# Patient Record
Sex: Male | Born: 1950
Health system: Southern US, Community
[De-identification: ages and names within clinical notes are randomized; demographics above are authoritative.]

## PROBLEM LIST (undated history)

## (undated) DIAGNOSIS — I4510 Unspecified right bundle-branch block: Secondary | ICD-10-CM

## (undated) DIAGNOSIS — I700 Atherosclerosis of aorta: Secondary | ICD-10-CM

## (undated) DIAGNOSIS — C801 Malignant (primary) neoplasm, unspecified: Secondary | ICD-10-CM

## (undated) DIAGNOSIS — I499 Cardiac arrhythmia, unspecified: Secondary | ICD-10-CM

## (undated) DIAGNOSIS — I509 Heart failure, unspecified: Secondary | ICD-10-CM

## (undated) DIAGNOSIS — E785 Hyperlipidemia, unspecified: Secondary | ICD-10-CM

## (undated) DIAGNOSIS — C61 Malignant neoplasm of prostate: Secondary | ICD-10-CM

## (undated) DIAGNOSIS — I10 Essential (primary) hypertension: Secondary | ICD-10-CM

## (undated) DIAGNOSIS — H3530 Unspecified macular degeneration: Secondary | ICD-10-CM

## (undated) DIAGNOSIS — M1990 Unspecified osteoarthritis, unspecified site: Secondary | ICD-10-CM

## (undated) DIAGNOSIS — I4821 Permanent atrial fibrillation: Secondary | ICD-10-CM

## (undated) DIAGNOSIS — N529 Male erectile dysfunction, unspecified: Secondary | ICD-10-CM

## (undated) HISTORY — DX: Malignant (primary) neoplasm, unspecified: C80.1

## (undated) HISTORY — PX: APPENDECTOMY: SHX54

## (undated) HISTORY — PX: HERNIA REPAIR: SHX51

## (undated) HISTORY — DX: Hyperlipidemia, unspecified: E78.5

## (undated) HISTORY — PX: PROSTATE BIOPSY: SHX241

## (undated) HISTORY — DX: Essential (primary) hypertension: I10

## (undated) HISTORY — DX: Unspecified macular degeneration: H35.30

## (undated) HISTORY — DX: Permanent atrial fibrillation: I48.21

## (undated) HISTORY — DX: Malignant neoplasm of prostate: C61

## (undated) HISTORY — PX: TOTAL HIP ARTHROPLASTY: SHX124

## (undated) HISTORY — PX: SPINE SURGERY: SHX786

---

## 1978-09-01 HISTORY — PX: OTHER SURGICAL HISTORY: SHX169

## 2004-10-31 ENCOUNTER — Ambulatory Visit: Payer: Self-pay | Admitting: Internal Medicine

## 2004-11-13 ENCOUNTER — Ambulatory Visit: Payer: Self-pay

## 2004-12-04 ENCOUNTER — Ambulatory Visit: Payer: Self-pay | Admitting: *Deleted

## 2005-09-01 HISTORY — PX: CARDIAC CATHETERIZATION: SHX172

## 2005-09-18 ENCOUNTER — Emergency Department: Payer: Self-pay | Admitting: Internal Medicine

## 2005-09-18 ENCOUNTER — Ambulatory Visit: Payer: Self-pay | Admitting: *Deleted

## 2005-09-18 ENCOUNTER — Observation Stay (HOSPITAL_COMMUNITY): Admission: EM | Admit: 2005-09-18 | Discharge: 2005-09-19 | Payer: Self-pay | Admitting: Emergency Medicine

## 2005-09-19 ENCOUNTER — Encounter: Payer: Self-pay | Admitting: Cardiology

## 2005-09-19 DIAGNOSIS — I2510 Atherosclerotic heart disease of native coronary artery without angina pectoris: Secondary | ICD-10-CM

## 2005-09-19 HISTORY — DX: Atherosclerotic heart disease of native coronary artery without angina pectoris: I25.10

## 2005-09-19 HISTORY — PX: LEFT HEART CATH AND CORONARY ANGIOGRAPHY: CATH118249

## 2005-10-16 ENCOUNTER — Ambulatory Visit: Payer: Self-pay | Admitting: Cardiology

## 2006-11-06 ENCOUNTER — Ambulatory Visit: Payer: Self-pay | Admitting: Internal Medicine

## 2007-11-30 ENCOUNTER — Ambulatory Visit: Payer: Self-pay | Admitting: Internal Medicine

## 2008-11-17 ENCOUNTER — Ambulatory Visit: Payer: Self-pay | Admitting: Internal Medicine

## 2008-11-17 ENCOUNTER — Encounter: Payer: Self-pay | Admitting: Internal Medicine

## 2008-11-17 DIAGNOSIS — I10 Essential (primary) hypertension: Secondary | ICD-10-CM | POA: Insufficient documentation

## 2008-11-17 DIAGNOSIS — I4891 Unspecified atrial fibrillation: Secondary | ICD-10-CM | POA: Insufficient documentation

## 2009-02-06 ENCOUNTER — Ambulatory Visit: Payer: Self-pay | Admitting: Gastroenterology

## 2009-11-12 ENCOUNTER — Ambulatory Visit: Payer: Self-pay | Admitting: Internal Medicine

## 2009-11-12 ENCOUNTER — Encounter: Payer: Self-pay | Admitting: Cardiovascular Disease

## 2010-10-02 NOTE — Assessment & Plan Note (Signed)
Summary: f1y  Medications Added METOPROLOL-HYDROCHLOROTHIAZIDE 50-25 MG TABS (METOPROLOL-HYDROCHLOROTHIAZIDE) 1 by mouth once daily DIGOXIN 0.25 MG TABS (DIGOXIN) Take one tablet by mouth daily FISH OIL 1000 MG CAPS (OMEGA-3 FATTY ACIDS) 1 by mouth once daily CIALIS 20 MG TABS (TADALAFIL) as needed ALEVE COLD & SINUS 120-220 MG XR12H-TAB (PSEUDOEPHEDRINE-NAPROXEN NA) as needed      Allergies Added: NKDA  Visit Type:  Follow-up Primary Provider:  Dr Sherrie Mustache  CC:  no complaints.  History of Present Illness: [name] returns today for followup.  He is a 60 yo man with a h/o atrial fibrillation, HTN and moderate obesity.  He continues to gain weight.  He is asymptomatic with regard to his atrial fibrillation. He denies c/p or sob or peripheral edema.   Preventive Screening-Counseling & Management  Alcohol-Tobacco     Alcohol drinks/day: 3     Smoking Status: quit  Caffeine-Diet-Exercise     Caffeine use/day: no     Does Patient Exercise: no  Current Problems (verified): 1)  Obesity-morbid (>100')  (ICD-278.01) 2)  Atrial Fibrillation  (ICD-427.31) 3)  Hypertension, Unspecified  (ICD-401.9)  Current Medications (verified): 1)  Metoprolol-Hydrochlorothiazide 50-25 Mg Tabs (Metoprolol-Hydrochlorothiazide) .Marland Kitchen.. 1 By Mouth Once Daily 2)  Crestor 10 Mg Tabs (Rosuvastatin Calcium) .Marland Kitchen.. 1 By Mouth Once Daily 3)  Aspirin 81 Mg Tbec (Aspirin) .... Take One Tablet By Mouth Daily 4)  Cialis 20 Mg Tabs (Tadalafil) .Marland Kitchen.. 1 By Mouth Once Daily 5)  Multivitamins   Tabs (Multiple Vitamin) .Marland Kitchen.. 1 By Mouth Once Daily 6)  Gnp Stool Softener 60 Mg/23ml Syrp (Docusate Sodium) .... As Needed 7)  Digoxin 0.25 Mg Tabs (Digoxin) .... Take One Tablet By Mouth Daily 8)  Fish Oil 1000 Mg Caps (Omega-3 Fatty Acids) .Marland Kitchen.. 1 By Mouth Once Daily 9)  Cialis 20 Mg Tabs (Tadalafil) .... As Needed 10)  Aleve Cold & Sinus 120-220 Mg Xr12h-Tab (Pseudoephedrine-Naproxen Na) .... As Needed  Allergies (verified): No  Known Drug Allergies  Past History:  Past Medical History: Last updated: 11/17/2008   Hypertension.   Mild obesity. Atrial fibrillation chronic. The patient states he has been in atrial      fibrillation for approximately 6 years.  Past Surgical History: Last updated: 11/17/2008 Include appendectomy and a hernia repair.  Risk Factors: Alcohol Use: 3 (11/12/2009) Caffeine Use: no (11/12/2009) Exercise: no (11/12/2009)  Risk Factors: Smoking Status: quit (11/12/2009)  Social History: Alcohol drinks/day:  3 Smoking Status:  quit Caffeine use/day:  no Does Patient Exercise:  no  Review of Systems  The patient denies chest pain, syncope, dyspnea on exertion, and peripheral edema.    Vital Signs:  Patient profile:   60 year old male Height:      72 inches Weight:      253.50 pounds BMI:     34.51 Pulse rate:   79 / minute Pulse rhythm:   regular BP sitting:   134 / 96  (left arm) Cuff size:   large  Vitals Entered By: Mercer Pod (November 12, 2009 9:27 AM)  Physical Exam  General:  Well developed, well nourished, in no acute distress.  HEENT: normal Neck: supple. No JVD. Carotids 2+ bilaterally no bruits [id number]:[id number] no rubs, gallops or murmur Lungs: CTA Ab: soft, nontender. nondistended. No HSM. Good bowel sounds Ext: warm. no cyanosis, clubbing or edema Neuro: alert and oriented. Grossly nonfocal. affect pleasant    EKG  Procedure date:  11/12/2009  Findings:      Atrial fibrillation with a  controlled ventricular response rate of: 79.  Impression & Recommendations:  Problem # 1:  ATRIAL FIBRILLATION (ICD-427.31) His ventricular rate is well controlled and he is asymptomatic.  I have recommended he continue his current meds.  His CHADS score is 1.  Will continue his ASA. His updated medication list for this problem includes:    Metoprolol-hydrochlorothiazide 50-25 Mg Tabs (Metoprolol-hydrochlorothiazide) .Marland Kitchen... 1 by mouth once daily    Aspirin 81 Mg  Tbec (Aspirin) .Marland Kitchen... Take one tablet by mouth daily    Digoxin 0.25 Mg Tabs (Digoxin) .Marland Kitchen... Take one tablet by mouth daily  Problem # 2:  HYPERTENSION, UNSPECIFIED (ICD-401.9) His  blood pressure is a  bit elevated. He denies medical compliance problems but does admit to dietary indiscretion with sodium.  I have encouraged him to reduce his sodium intake. Also, I have asked him to start walking. His updated medication list for this problem includes:    Metoprolol-hydrochlorothiazide 50-25 Mg Tabs (Metoprolol-hydrochlorothiazide) .Marland Kitchen... 1 by mouth once daily    Aspirin 81 Mg Tbec (Aspirin) .Marland Kitchen... Take one tablet by mouth daily  Patient Instructions: 1)  Your physician recommends that you schedule a follow-up appointment in: 1 year 2)  Your physician recommends that you continue on your current medications as directed. Please refer to the Current Medication list given to you today. Prescriptions: DIGOXIN 0.25 MG TABS (DIGOXIN) Take one tablet by mouth daily  #30 x 6   Entered by:   Charlena Cross, RN, BSN   Authorized by:   Laren Boom, MD, [facility]   Signed by:   Charlena Cross, RN, BSN on 11/12/2009   Method used:   Electronically to        [facility]* (retail)       [address]       [place], [place]  [address]       Ph: [phone]       Fax: [phone]   RxID:   [id number]

## 2010-11-26 ENCOUNTER — Ambulatory Visit (INDEPENDENT_AMBULATORY_CARE_PROVIDER_SITE_OTHER): Payer: 59 | Admitting: Internal Medicine

## 2010-11-26 ENCOUNTER — Encounter: Payer: Self-pay | Admitting: Internal Medicine

## 2010-11-26 DIAGNOSIS — I10 Essential (primary) hypertension: Secondary | ICD-10-CM

## 2010-11-26 DIAGNOSIS — I4891 Unspecified atrial fibrillation: Secondary | ICD-10-CM

## 2010-11-26 MED ORDER — ASPIRIN 325 MG PO TBEC: 325.0000 mg | DELAYED_RELEASE_TABLET | Freq: Every day | ORAL | Status: DC

## 2010-11-26 NOTE — Progress Notes (Signed)
HPI Mr. Lamm returns today for followup. He is a pleasant 60 yo man with a h/o chronic atrial fib and HTN. Over the last year he has lost about 40 lbs. He feels well and denies palpitations, c/p or sob. No syncope. No Known Allergies   Current Outpatient Prescriptions  Medication Sig Dispense Refill  . digoxin (LANOXIN) 0.25 MG tablet Take 250 mcg by mouth daily.        Marland Kitchen docusate (COLACE) 60 MG/15ML syrup Take 60 mg by mouth daily.        . fish oil-omega-3 fatty acids 1000 MG capsule Take 2 g by mouth daily.        . Glucosamine 500 MG CAPS Take 500 mg by mouth 1 dose over 46 hours.        Marland Kitchen loratadine (CLARITIN) 10 MG tablet Take 10 mg by mouth daily.        Marland Kitchen lovastatin (MEVACOR) 20 MG tablet Take 20 mg by mouth at bedtime.        . metoprolol-hydrochlorothiazide (LOPRESSOR HCT) 50-25 MG per tablet Take 1 tablet by mouth daily.        . Multiple Vitamin (MULTIVITAMIN) tablet Take 1 tablet by mouth daily.        . tadalafil (CIALIS) 20 MG tablet Take 20 mg by mouth daily as needed.        . vitamin B-12 (CYANOCOBALAMIN) 100 MCG tablet Take 100 mcg by mouth daily.        Marland Kitchen aspirin 81 MG EC tablet Take 81 mg by mouth daily.       . Pseudoephedrine-Naproxen Na (ALEVE COLD & SINUS) 120-220 MG TB12 Take by mouth as needed.        . rosuvastatin (CRESTOR) 10 MG tablet Take 10 mg by mouth daily.           Past Medical History  Diagnosis Date  . Hypertension   . Mild obesity   . Atrial fibrillation     chronic    ROS:   All systems reviewed and negative except as noted in the HPI.   Past Surgical History  Procedure Date  . Appendectomy   . Hernia repair      History reviewed. No pertinent family history.   History   Social History  . Marital Status: Single    Spouse Name: N/A    Number of Children: N/A  . Years of Education: N/A   Occupational History  . Not on file.   Social History Main Topics  . Smoking status: Former Smoker    Quit date: 11/12/2009  .  Smokeless tobacco: Never Used  . Alcohol Use: 1.8 oz/week    3 Cans of beer per week  . Drug Use: No  . Sexually Active:    Other Topics Concern  . Not on file   Social History Narrative  . No narrative on file     BP 128/82  Pulse 57  Ht 6\' 1"  (1.854 m)  Wt 217 lb (98.431 kg)  BMI 28.63 kg/m2  Physical Exam:  Well appearing NAD HEENT: Unremarkable Neck:  No JVD, no thyromegally Lymphatics:  No adenopathy Back:  No CVA tenderness Lungs:  Clear HEART:  Irregular rate rhythm, no murmurs, no rubs, no clicks Abd:  Flat, positive bowel sounds, no organomegally, no rebound, no guarding Ext:  2 plus pulses, no edema, no cyanosis, no clubbing Skin:  No rashes no nodules Neuro:  CN II through XII intact, motor grossly intact  EKG Atrial fib with a controlled VR.  Assess/Plan:

## 2010-11-26 NOTE — Patient Instructions (Signed)
Your physician recommends that you schedule a follow-up appointment in: 2 years  

## 2010-11-26 NOTE — Assessment & Plan Note (Signed)
His blood pressure is well controlled since I saw him last, particularly after his 40 lb weight loss. No change in meds. He will maintain a low sodium diet.

## 2010-11-26 NOTE — Assessment & Plan Note (Signed)
His ventricular rate is well controlled. I have asked him to call me if he has more problems and followup in 2 yrs. No change in his meds.

## 2010-12-24 ENCOUNTER — Encounter: Payer: Self-pay | Admitting: Internal Medicine

## 2011-01-14 NOTE — Assessment & Plan Note (Signed)
[facility]                         ELECTROPHYSIOLOGY OFFICE NOTE   Steven Saunders, Steven Saunders                   MRN:          [id number]  DATE:11/30/2007                            DOB:          [birth year]    Steven Saunders returns today for follow-up.  He is a very pleasant 60-year-  old male with a history of chronic atrial fibrillation, who has been  fairly well-controlled over the years.  He also has hypertension, which  has also been well-controlled.  He has dyslipidemia.   He has no specific complaints today.  He denies chest pain or shortness  of breath, and continues to do quite well exercising on a regular basis.  He has lost a few pounds in the interim year since I saw him last.  He  had no specific complaints otherwise today.   MEDICATIONS:  1. Crestor 10 mg daily.  2. Metoprolol 100 mg daily.  3. Digoxin 0.25 daily.  4. Multiple vitamin.  5. Aspirin 325 a day.   PHYSICAL EXAMINATION:  He is a pleasant, well-appearing middle-aged man  in no distress.  Blood pressure 130/82, pulse 72 and irregular,  respirations were 18.  Weight was 254 pounds.  NECK:  Revealed no jugular distention.  There was no thyromegaly.  LUNGS:  Clear bilaterally to auscultation.  No wheezes, rales or rhonchi  are present.  CARDIOVASCULAR:  Revealed an irregularly irregular rhythm with normal S1  and S2.  The PMI was not enlarged nor laterally displaced.  ABDOMINAL:  Obese, nontender and nondistended.  There was no  organomegaly.  The bowel sounds were present.  There was no rebound or  guarding.  EXTREMITIES:  Demonstrated no cyanosis, clubbing or edema.  The pulses  were 2+ and symmetric.   EKG:  Done today, demonstrates atrial fibrillation with controlled  ventricular response.   IMPRESSION:  1. Chronic atrial fibrillation.  2. Well-controlled hypertension   DISCUSSION:  Overall Steven Saunders is very stable and has tolerated his  atrial fibrillation very  nicely.  His rates were very well controlled.  I have recommended he follow back up with [place] in 1  year.  I made no changes in his medications, although I have allowed his  metoprolol to be substituted for atenolol if need be.     Doylene Canning. Ladona Ridgel, MD  Electronically Signed    GWT/MedQ  DD: 11/30/2007  DT: 12/01/2007  Job #: [id number]   cc:   Mila Merry

## 2011-01-14 NOTE — Assessment & Plan Note (Signed)
[facility]                         ELECTROPHYSIOLOGY OFFICE NOTE   Steven Saunders, Steven Saunders                   MRN:          [id number]  DATE:11/17/2008                            DOB:          1951/02/22    [name] returns today for followup.  We had not seen him in a year.  He is a very pleasant middle-aged male with atrial fibrillation and  controlled hypertension, who has obesity.  Overall in the last year, he  had been stable.  He has had very little on the way of symptoms from his  atrial fibrillation.  Previously, the patient had been on a trial of  rhythm control with amiodarone, but this was abandoned in favor of rate  control which he has tolerated very nicely.  The patient does not have  coronary artery disease.   CURRENT MEDICATIONS:  1. Aspirin 325 a day.  2. Digoxin 0.25 a day.  3. Metoprolol 100 a day.  4. Crestor 10 a day.   PHYSICAL EXAMINATION:  GENERAL:  He is a pleasant, well-appearing man in  no acute distress.  VITAL SIGNS:  The blood pressure was 128/84, the pulse was 86 and  irregular, and respirations were 18.  HEENT:  Normocephalic and atraumatic.  Pupils equal and round.  The  oropharynx is moist.  Sclerae anicteric.  NECK:  No jugular venous distention.  There is no thyromegaly.  LUNGS:  Clear bilaterally to auscultation.  No wheezes, rales, or  rhonchi are present.  There is no increased work of breathing.  CARDIOVASCULAR:  Irregular regular rhythm.  Normal S1 and S2.  PMI was  not enlarged nor laterally displaced.  ABDOMEN:  Soft and nontender.  EXTREMITIES:  No edema.   The EKG demonstrates atrial fibrillation with a controlled ventricular  response.   IMPRESSION:  1. Permanent atrial fibrillation.  2. Well-controlled hypertension.  3. Dyslipidemia.   DISCUSSION:  Overall, [name] is stable and tolerating his AFib very  nicely.  We will continue on with his current medical therapy and I have  made no  changes today.  I will see the patient back for AFib follow up  in 1 year.      Doylene Canning. Ladona Ridgel, MD  Electronically Signed    GWT/MedQ  DD: 11/19/2008  DT: 11/20/2008  Job #: [id number]

## 2011-01-17 NOTE — Discharge Summary (Signed)
NAMESIVAN, QUAST               ACCOUNT NO.:  [id number]   MEDICAL RECORD NO.:  [id number]          PATIENT TYPE:  INP   LOCATION:  3707                         FACILITY:  [facility]   PHYSICIAN:  Dorian Pod, NP    DATE OF BIRTH:  02-17-1951   DATE OF ADMISSION:  09/18/2005  DATE OF DISCHARGE:  09/19/2005                                 DISCHARGE SUMMARY   DISCHARGING PHYSICIAN:  P. Eden Emms, M.D.   PRIMARY CARDIOLOGIST:  Rosette Reveal, M.D.   DISCHARGE DIAGNOSIS:  1.  Atrial fibrillation chronic. The patient states he has been in atrial      fibrillation for approximately 6 years.  2.  Hypertension.  3.  Chest discomfort status post cardiac catheterization on September 19, 2005      by Dr. Eden Emms. Patient with no significant coronary artery disease.   HOSPITAL COURSE:  Mr. Sheriff is a 60 year old Caucasian gentleman with a  history of chronic asymptomatic atrial fibrillation controlled by digoxin  and Toprol. The patient has adamantly refused to take Coumadin and is aware  of the risk/benefits associated with Coumadin therapy. He does take aspirin  325 mg daily. On the day of admission the patient initially presented to  [facility] complaining of some chest discomfort. As  the patient was followed by Dr. Lewayne Bunting he was transferred to [facility] for further evaluation. He was seen by Dr. Glennon Hamilton in the emergency  room. EKG at that time showed atrial fibrillation at a rate of 84 without ST  or T wave changes. Blood work was stable. Troponin less than 0.05, d-dimer  less than 0.22. The patient was admitted over the evening. Plans for cardiac  catheterization to evaluate coronaries the following day. Taken to the cath  lab on September 19, 2005 by Dr. Burna Forts. Results as stated above. The  patient tolerated the procedure without complications. A 2D echocardiogram  showed an ejection fraction of 45-50% with mild diffuse left ventricular  hypokinesis, mild  aortic valve regurgitation and mild aortic root dilatation  and mild mitral valve regurgitation. The patient without further episodes of  chest discomfort, cath site stable. The patient discharged home by Joellyn Rued after bed rest completed.   DISCHARGE MEDICATIONS:  Medications at the time of discharge include Toprol  100 mg daily, Lanoxin 0.25 mg daily, aspirin 325 mg daily.   Wound care, activity per [facility] post cardiac catheterization  discharge instructions.   Follow up with Dr. Ladona Ridgel, P.A. Loura Pardon on October 16, 2005 at 3:30. The  patient also instructed to not use more than Goody's powder within a 24 hour  period.      Dorian Pod, NP     MB/MEDQ  D:  10/27/2005  T:  10/28/2005  Job:  [id number]   cc:   Doylene Canning. Ladona Ridgel, M.D.  [address]  [address]  [place]  [place] [address]

## 2011-01-17 NOTE — Cardiovascular Report (Signed)
NAMEDOMANIC, MATUSEK               ACCOUNT NO.:  [id number]   MEDICAL RECORD NO.:  [id number]          PATIENT TYPE:  INP   LOCATION:  3707                         FACILITY:  [facility]   PHYSICIAN:  Charlton Haws, M.D.     DATE OF BIRTH:  1950-10-30   DATE OF PROCEDURE:  09/19/2005  DATE OF DISCHARGE:                              CARDIAC CATHETERIZATION   PROCEDURE:  Coronary arteriography.   INDICATIONS:  Chest pain. The patient has a history chronic atrial  fibrillation but he was not on Coumadin prior to the study.   Cine catheterization is done with a 6-French catheter from right femoral  artery.  At the end of the case, a hand injection of the right femoral and  iliac artery showed [place] to be in good position for AngioSeal.  We placed a  collagen plug and there was good hemostasis at the end of the case.   Left main coronary artery was normal.   Left anterior descending artery was somewhat diminutive and there was 30%  tubular lesions in the mid and distal vessel.   Circumflex coronary artery was nondominant. There was one large obtuse  marginal branch. The circumflex coronary artery was normal.   Right coronary artery was dominant. It was normal.   RAO ventriculography:  RAO ventriculography was normal, LV function was  hyperdynamic. EF was 80% or greater. There was no significant MR.   Pullback was somewhat difficult due to the patient's atrial fibrillation and  hyperdynamic function. However, the aortic pressure was in the 140/90 range.  LV pressure is 140/21 range.   IMPRESSION:  The patient has no significant coronary artery disease. He had  fairly good hemostasis of his right femoral artery.  He will be discharged  later today to follow-up with Dr. Ladona Ridgel in regards to his chronic atrial  fibrillation.  Note should be made that the patient has made a conscious  decision for the last 6-7 years not to be on Coumadin therapy despite his  chronic atrial fibrillation.           ______________________________  Charlton Haws, M.D.     PN/MEDQ  D:  09/19/2005  T:  09/19/2005  Job:  [id number]   cc:   Doylene Canning. Ladona Ridgel, M.D.  [address]  [address]  [place]  [place] [address]

## 2011-01-17 NOTE — Assessment & Plan Note (Signed)
 HEALTHCARE                         ELECTROPHYSIOLOGY OFFICE NOTE   Steven Saunders, Steven Saunders                   MRN:          [id number]  DATE:11/06/2006                            DOB:          [birth year]    Steven Saunders returns today for followup. He is a very pleasant, middle-  aged man with atrial fibrillation and controlled hypertension who  returns for followup. He has had no symptoms other than occasional  palpitations. He denies syncope, he denies shortness of breath, he  denies chest pain. He continues to exercise regularly. The patient does  admit to some dietary noncompliance and as such has gained weight over  the last year. His medications include a full strength 325 mg aspirin,  multivitamin, digoxin and metoprolol.   PHYSICAL EXAMINATION:  GENERAL:  Notable for a pleasant, well-appearing  man in no distress.  VITAL SIGNS:  Blood pressure was 136/90, the pulse was 86 and regular.  Respirations were 18, the weight was 257 pounds, up 7 pounds from a year  ago.  NECK:  Revealed no jugular venous distention. There was no thyromegaly.  LUNGS:  Clear bilaterally to auscultation, there were no wheezes, rales  or rhonchi.  CARDIOVASCULAR:  Irregular irregular rhythm with normal S1 and S2.  EXTREMITIES:  Demonstrated no cyanosis, clubbing or edema. Pulses were  2+ and symmetric.   The EKG demonstrates atrial fibrillation  with a controlled ventricular  response.   IMPRESSION:  1. Permanent atrial fibrillation.  2. Controlled hypertension.  3. Increased weight.   DISCUSSION:  Will plan on obtaining fasting lipids today and also obtain  a BNP. Of note, this will be done not today rather but at the earliest  possible convenient time by way of our [place] office. I will plan to  see the patient back a year and will review his fasting lipid results to  see whether he needed to be placed on a statin therapy.     Doylene Canning. Ladona Ridgel, MD  Electronically Signed    GWT/MedQ  DD: 11/06/2006  DT: 11/06/2006  Job #: [id number]

## 2011-01-17 NOTE — H&P (Signed)
NAME:  Steven Saunders, Steven Saunders NO.:  [id number]   MEDICAL RECORD NO.:  [id number]          PATIENT TYPE:  EMS   LOCATION:  MAJO                         FACILITY:  [facility]   PHYSICIAN:  Cecil Cranker, M.D.DATE OF BIRTH:  11/02/1950   DATE OF ADMISSION:  09/18/2005  DATE OF DISCHARGE:                                HISTORY & PHYSICAL   Steven Saunders is a 60 year old Caucasian gentleman with no known history of  coronary artery disease, however, history of atrial fibrillation followed by  Dr. Ladona Ridgel. He presented to [facility] today  complaining of intermittent chest discomfort. However, patient is known to  [facility]. The patient transferred to [facility] for  evaluation. Steven Saunders says his chest discomfort initially started  yesterday. He first noticed it while unloading some heavy machinery. Pain  lasted approximately 30 minutes and subsided on its own. He denies any  associated symptoms at that time except some palpitations. The patient once  again had a similar episode of chest pain today and decided to go to the  emergency room in  for further evaluation. The patient is currently  asymptomatic. States he has never had any pain similar to this in the past.  However patient states he did feel just unusual with the chest discomfort  and it became quite concerning. Steven Saunders is a physically active gentleman.  He exercises regularly every other day on his treadmill without any episodes  of chest discomfort or dyspnea. Chronic atrial fibrillation is asymptomatic,  rate controlled with Digitek and Toprol.   ALLERGIES:  No known drug allergies.   MEDICATIONS:  1.  Toprol XL 100 mg daily.  2.  Digitek 0.25 mg daily.  3.  Aspirin 325 mg daily.  4.  Stool softener p.r.n.  5.  Goodie's Powder p.r.n.   We will have a talk with patient about use of Goodie's Powders in setting of  blood thinners, if needed, for future  purposes.   PAST MEDICAL HISTORY:  1.  Chronic atrial fibrillation approximately 6 years.  2.  Hypertension.  3.  Mild obesity.   SURGERIES:  Include appendectomy and a hernia repair.   Past cardiac work up was done at [facility] prior to being  followed by Dr. Ladona Ridgel. He had an echocardiogram approximately 5 years ago.  Stress-test approximately 5 years ago which was negative per patient's  report. Attempt at cardioversion 2 years ago with temporary success. He was  on amiodarone therapy at that time.   SOCIAL HISTORY:  He lives in [place] alone. He is a Solicitor. He has  quit using tobacco approximately 5 years ago. Exercise treadmill every other  day for 20 to 30 minutes. He has 2 to 3 beers every day. Diet regular, no  added salt. No drugs. No herbal medications. Please note, patient adamantly  refuses the use of Coumadin for his atrial fibrillation.   FAMILY HISTORY:  Mother alive with Parkinson's disease. Father deceased at  age 66 secondary to MI, with history of cancer. Sister without any known  coronary artery disease.  REVIEW OF SYSTEMS:  Positive for chest pain, palpitations as described  above. Positive for occasional nocturia.  All other systems negative. Per patient.   PHYSICAL EXAMINATION:  VITAL SIGNS:  Temperature 98, pulse is 93,  respirations 20, blood pressure initially 174/96, currently 122/78, pulse  oximetry 99% on 2 liters.  GENERAL:  He is pleasant, in no acute distress, 60 year old Caucasian  gentleman.  HEENT:  Pupils are equal, round and reactive to light. Sclerae clear.  NECK:  Supple without lymphadenopathy, no bruit or JVD.  CARDIOVASCULAR:  Irregularly irregular heart rate.  LUNGS:  Clear to auscultation bilaterally.  ABDOMEN:  Soft, nontender, positive bowel sounds.  EXTREMITIES:  Without clubbing, cyanosis or edema. He has 2+ dorsalis pedis  pulses bilaterally.  NEUROLOGIC:  He is alert and oriented x3. Cranial nerves  II-XII grossly  intact.   Chest x-ray - film was done at [facility] without any acute  disease.  EKG at a rate of 84, atrial fibrillation without ST or T wave changes.   LAB WORK AT THIS TIME:  Pending. We do have labs from [facility] showing a troponin of less than 0.01, hemoglobin 15, hematocrit 43,  platelets 179,000 with a white blood cell count of 6.2 thousand. BUN 15,  creatinine 0.9, glucose slightly elevated at 116, AST 21, ALT 45, potassium  4.7 (please note these labs are from [facility]).   Dr. Glennon Hamilton in to examine and assess patient. With history of atrial  fibrillation which is chronic, no anticoagulation therapy per patient's  request, mild hypertension, patient to be admitted to rule out myocardial  infarction. Will proceed with cardiac catheterization tomorrow in the  setting of chest pain. Check a fasting lipid panel, a hemoglobin A1C, we  will also check a D-dimer since patient is not coagulated with his atrial  fibrillation. Continue his Toprol, aspirin, Protonix and Digitek from home.  We will also check a 2D echocardiogram.      Dorian Pod, NP    ______________________________  E. Graceann Congress, M.D.    MB/MEDQ  D:  09/18/2005  T:  09/19/2005  Job:  [id number]

## 2012-09-01 HISTORY — PX: TOTAL HIP ARTHROPLASTY: SHX124

## 2012-09-01 HISTORY — PX: JOINT REPLACEMENT: SHX530

## 2012-11-08 ENCOUNTER — Encounter: Payer: Self-pay | Admitting: *Deleted

## 2012-11-09 ENCOUNTER — Ambulatory Visit (INDEPENDENT_AMBULATORY_CARE_PROVIDER_SITE_OTHER): Payer: BC Managed Care – PPO | Admitting: Internal Medicine

## 2012-11-09 ENCOUNTER — Encounter: Payer: Self-pay | Admitting: Internal Medicine

## 2012-11-09 VITALS — BP 152/70 | HR 70 | Ht 72.0 in | Wt 237.8 lb

## 2012-11-09 DIAGNOSIS — I4891 Unspecified atrial fibrillation: Secondary | ICD-10-CM

## 2012-11-09 MED ORDER — ASPIRIN EC 81 MG PO TBEC: 81.0000 mg | DELAYED_RELEASE_TABLET | Freq: Every day | ORAL | Status: DC

## 2012-11-09 NOTE — Assessment & Plan Note (Signed)
His blood pressure is slightly elevated today. I've asked the patient to maintain a low-sodium diet, lose weight, and continue his current medical therapy. I've also asked him to reduce his dose of aspirin from 325 mg daily to 81 mg daily.

## 2012-11-09 NOTE — Progress Notes (Signed)
HPI Steven Saunders returns today for followup. He is a very pleasant 62 year old man with a history of hypertension, and atrial fibrillation. He continues to do well. He has retired from working full time and now works 3 days a week at the [employer]. He denies chest pain, sob, or sincope. No edema. He admits to some dietary indiscretion and has not been able to lose weight. No Known Allergies   Current Outpatient Prescriptions  Medication Sig Dispense Refill  . aspirin 325 MG EC tablet Take 1 tablet (325 mg total) by mouth daily.  30 tablet  6  . digoxin (LANOXIN) 0.25 MG tablet Take 250 mcg by mouth daily.        Marland Kitchen docusate (COLACE) 60 MG/15ML syrup Take 60 mg by mouth daily.        . Glucosamine 500 MG CAPS Take 500 mg by mouth 1 dose over 46 hours.        Marland Kitchen loratadine (CLARITIN) 10 MG tablet Take 10 mg by mouth daily.        Marland Kitchen lovastatin (MEVACOR) 20 MG tablet Take 20 mg by mouth at bedtime.        . meloxicam (MOBIC) 15 MG tablet Take 15 mg by mouth daily.      . metoprolol-hydrochlorothiazide (LOPRESSOR HCT) 50-25 MG per tablet Take 1 tablet by mouth daily.        . Multiple Vitamin (MULTIVITAMIN) tablet Take 1 tablet by mouth daily.        . tadalafil (CIALIS) 20 MG tablet Take 20 mg by mouth daily as needed.        . vitamin B-12 (CYANOCOBALAMIN) 100 MCG tablet Take 100 mcg by mouth daily.         No current facility-administered medications for this visit.     Past Medical History  Diagnosis Date  . Hypertension   . Mild obesity   . Atrial fibrillation     chronic  . Other and unspecified hyperlipidemia   . Benign neoplasm of colon   . Other abnormal glucose   . Enthesopathy of hip region   . Osteoarthrosis, unspecified whether generalized or localized, pelvic region and thigh     ROS:   All systems reviewed and negative except as noted in the HPI.   Past Surgical History  Procedure Laterality Date  . Appendectomy    . Hernia repair    . Cardiac catheterization  2007   . Colonoscopy       Family History  Problem Relation Age of Onset  . Hypertension Mother   . Heart attack Father      History   Social History  . Marital Status: Single    Spouse Name: N/A    Number of Children: N/A  . Years of Education: N/A   Occupational History  . Not on file.   Social History Main Topics  . Smoking status: Former Smoker    Quit date: 11/12/2009  . Smokeless tobacco: Never Used  . Alcohol Use: 1.8 oz/week    3 Cans of beer per week     Comment: daily  . Drug Use: No  . Sexually Active: Not on file   Other Topics Concern  . Not on file   Social History Narrative  . No narrative on file     BP 152/70  Pulse 70  Ht 6' (1.829 m)  Wt 237 lb 12 oz (107.843 kg)  BMI 32.24 kg/m2  Physical Exam:  Well appearing middle-aged  man, NAD HEENT: Unremarkable Neck:  6 cm JVD, no thyromegally Lungs:  Clear with no wheezes, rales, or rhonchi. HEART:  IRegular rate rhythm, no murmurs, no rubs, no clicks Abd:  soft, positive bowel sounds, no organomegally, no rebound, no guarding Ext:  2 plus pulses, no edema, no cyanosis, no clubbing Skin:  No rashes no nodules Neuro:  CN II through XII intact, motor grossly intact  EKG - atrial fibrillation with a controlled ventricular response   Assess/Plan:

## 2012-11-09 NOTE — Patient Instructions (Signed)
Your physician wants you to follow-up in: 2 years with Dr. Ladona Ridgel. You will receive a reminder letter in the mail two months in advance. If you don't receive a letter, please call our office to schedule the follow-up appointment.  Your physician has recommended you make the following change in your medication:  -decrease Aspirin to 81 mg daily

## 2013-05-31 ENCOUNTER — Ambulatory Visit: Payer: Self-pay | Admitting: Specialist

## 2013-05-31 LAB — CBC
MCHC: 35.3 g/dL (ref 32.0–36.0)
Platelet: 153 10*3/uL (ref 150–440)
RBC: 4.59 10*6/uL (ref 4.40–5.90)
RDW: 13.6 % (ref 11.5–14.5)

## 2013-05-31 LAB — BASIC METABOLIC PANEL
BUN: 15 mg/dL (ref 7–18)
Calcium, Total: 9.1 mg/dL (ref 8.5–10.1)
Chloride: 101 mmol/L (ref 98–107)
Co2: 27 mmol/L (ref 21–32)
Creatinine: 0.93 mg/dL (ref 0.60–1.30)
EGFR (Non-African Amer.): >60
Osmolality: 271 (ref 275–301)
Potassium: 3.7 mmol/L (ref 3.5–5.1)
Sodium: 135 mmol/L — ABNORMAL LOW (ref 136–145)

## 2013-05-31 LAB — MRSA PCR SCREENING

## 2013-05-31 LAB — PROTIME-INR: Prothrombin Time: 13.7 secs (ref 11.5–14.7)

## 2013-06-07 ENCOUNTER — Inpatient Hospital Stay: Payer: Self-pay | Admitting: Specialist

## 2013-06-07 LAB — URINALYSIS, COMPLETE
Bilirubin,UR: NEGATIVE
Glucose,UR: NEGATIVE mg/dL (ref 0–75)
Protein: NEGATIVE
Specific Gravity: 1.016 (ref 1.003–1.030)
Squamous Epithelial: NONE SEEN
WBC UR: 25 /HPF (ref 0–5)

## 2013-06-08 LAB — BASIC METABOLIC PANEL
Calcium, Total: 8.1 mg/dL — ABNORMAL LOW (ref 8.5–10.1)
Chloride: 98 mmol/L (ref 98–107)
Creatinine: 0.93 mg/dL (ref 0.60–1.30)
EGFR (African American): >60
EGFR (Non-African Amer.): >60
Potassium: 3.8 mmol/L (ref 3.5–5.1)

## 2013-06-08 LAB — CBC WITH DIFFERENTIAL/PLATELET
Basophil #: 0 10*3/uL (ref 0.0–0.1)
Eosinophil #: 0.2 10*3/uL (ref 0.0–0.7)
HCT: 36.5 % — ABNORMAL LOW (ref 40.0–52.0)
HGB: 13.1 g/dL (ref 13.0–18.0)
Lymphocyte #: 1.6 10*3/uL (ref 1.0–3.6)
Lymphocyte %: 16.4 %
MCH: 34.9 pg — ABNORMAL HIGH (ref 26.0–34.0)
MCHC: 35.9 g/dL (ref 32.0–36.0)
MCV: 97 fL (ref 80–100)
Monocyte %: 13.6 %
Neutrophil #: 6.5 10*3/uL (ref 1.4–6.5)
Neutrophil %: 67.9 %
Platelet: 117 10*3/uL — ABNORMAL LOW (ref 150–440)
RDW: 13.1 % (ref 11.5–14.5)

## 2013-11-01 ENCOUNTER — Encounter: Payer: Self-pay | Admitting: Internal Medicine

## 2013-11-01 ENCOUNTER — Ambulatory Visit (INDEPENDENT_AMBULATORY_CARE_PROVIDER_SITE_OTHER): Payer: Medicare Other | Admitting: Internal Medicine

## 2013-11-01 VITALS — BP 138/80 | HR 77 | Ht 72.5 in | Wt 251.5 lb

## 2013-11-01 DIAGNOSIS — I4891 Unspecified atrial fibrillation: Secondary | ICD-10-CM

## 2013-11-01 NOTE — Assessment & Plan Note (Signed)
His ventricular rate is well controlled. I have discussed the treatment options with the patient and he will continue his current meds. I would not add additional beta blocker at this time.

## 2013-11-01 NOTE — Progress Notes (Signed)
HPI Steven Saunders returns today for followup. He is a very pleasant 63 year old man with a history of hypertension, and atrial fibrillation. He continues to do well. He has retired from working full time and now works 3 days a week at the [employer]. He denies chest pain, sob, or sincope. No edema. He admits to some dietary indiscretion and has not been able to lose weight. He notes that he is getting married and in the last year gained weight but also lost weight.  No Known Allergies   Current Outpatient Prescriptions  Medication Sig Dispense Refill  . aspirin EC 81 MG tablet Take 1 tablet (81 mg total) by mouth daily.  90 tablet  3  . digoxin (LANOXIN) 0.25 MG tablet Take 250 mcg by mouth daily.        Marland Kitchen docusate (COLACE) 60 MG/15ML syrup Take 60 mg by mouth daily.        . Glucosamine 500 MG CAPS Take 500 mg by mouth daily.       Marland Kitchen loratadine (CLARITIN) 10 MG tablet Take 10 mg by mouth daily.        Marland Kitchen lovastatin (MEVACOR) 20 MG tablet Take 20 mg by mouth at bedtime.        . meloxicam (MOBIC) 15 MG tablet Take 15 mg by mouth daily.      . metoprolol-hydrochlorothiazide (LOPRESSOR HCT) 50-25 MG per tablet Take 1 tablet by mouth daily.        . Multiple Vitamin (MULTIVITAMIN) tablet Take 1 tablet by mouth daily.        . tadalafil (CIALIS) 20 MG tablet Take 20 mg by mouth daily as needed.        . valACYclovir (VALTREX) 1000 MG tablet Take 1 tablet by mouth as needed.      . vitamin B-12 (CYANOCOBALAMIN) 100 MCG tablet Take 100 mcg by mouth daily.         No current facility-administered medications for this visit.     Past Medical History  Diagnosis Date  . Hypertension   . Mild obesity   . Atrial fibrillation     chronic  . Other and unspecified hyperlipidemia   . Benign neoplasm of colon   . Other abnormal glucose   . Enthesopathy of hip region   . Osteoarthrosis, unspecified whether generalized or localized, pelvic region and thigh     ROS:   All systems reviewed and negative  except as noted in the HPI.   Past Surgical History  Procedure Laterality Date  . Appendectomy    . Hernia repair    . Cardiac catheterization  2007  . Colonoscopy       Family History  Problem Relation Age of Onset  . Hypertension Mother   . Heart attack Father      History   Social History  . Marital Status: Single    Spouse Name: N/A    Number of Children: N/A  . Years of Education: N/A   Occupational History  . Not on file.   Social History Main Topics  . Smoking status: Former Smoker    Quit date: 11/12/2009  . Smokeless tobacco: Never Used  . Alcohol Use: 1.8 oz/week    3 Cans of beer per week     Comment: daily  . Drug Use: No  . Sexual Activity: Not on file   Other Topics Concern  . Not on file   Social History Narrative  . No narrative on file  BP 138/80  Pulse 77  Ht 6' 0.5" (1.842 m)  Wt 251 lb 8 oz (114.08 kg)  BMI 33.62 kg/m2  Physical Exam:  Well appearing obese, middle-aged man, NAD HEENT: Unremarkable Neck:  6 cm JVD, no thyromegally Lungs:  Clear with no wheezes, rales, or rhonchi. HEART:  IRegular rate rhythm, no murmurs, no rubs, no clicks Abd:  soft, positive bowel sounds, no organomegally, no rebound, no guarding Ext:  2 plus pulses, no edema, no cyanosis, no clubbing Skin:  No rashes no nodules Neuro:  CN II through XII intact, motor grossly intact  EKG - atrial fibrillation with a controlled ventricular response   Assess/Plan:

## 2013-11-01 NOTE — Patient Instructions (Signed)
Your physician wants you to follow-up in: 12 months with Dr. Taylor. You will receive a reminder letter in the mail two months in advance. If you don't receive a letter, please call our office to schedule the follow-up appointment.    

## 2013-11-01 NOTE — Assessment & Plan Note (Signed)
We discussed the importance of weight loss. I considered referral for bariatric surgery but the patient claims that he will lose weight once he gets married because his wife to be does not eat much.

## 2013-11-24 ENCOUNTER — Telehealth: Payer: Self-pay | Admitting: Internal Medicine

## 2013-11-24 NOTE — Telephone Encounter (Signed)
Walk in pt Form "[place] DMV" Envelope Sealed" dropped Off ave to [employer]  3.26.15/kdm

## 2013-11-25 ENCOUNTER — Telehealth: Payer: Self-pay | Admitting: Internal Medicine

## 2013-11-25 NOTE — Telephone Encounter (Signed)
DOT papers Picked Up

## 2013-11-25 NOTE — Telephone Encounter (Signed)
LMOVM For Pt Dept Of Transportation papers Ready For P/U

## 2013-12-27 ENCOUNTER — Telehealth: Payer: Self-pay | Admitting: Internal Medicine

## 2013-12-27 NOTE — Telephone Encounter (Signed)
Walk in pt Form " Dept of Transportation " papers Dropped off gave to [employer]

## 2014-01-09 ENCOUNTER — Ambulatory Visit: Payer: Self-pay | Admitting: Family Medicine

## 2014-01-10 ENCOUNTER — Telehealth: Payer: Self-pay | Admitting: Internal Medicine

## 2014-01-10 NOTE — Telephone Encounter (Signed)
Rec'd from [facility] forward 10 pages to Dr.Tayor

## 2014-01-13 ENCOUNTER — Other Ambulatory Visit: Payer: Self-pay | Admitting: *Deleted

## 2014-01-13 DIAGNOSIS — I4891 Unspecified atrial fibrillation: Secondary | ICD-10-CM

## 2014-02-02 ENCOUNTER — Other Ambulatory Visit: Payer: Self-pay | Admitting: *Deleted

## 2014-02-02 ENCOUNTER — Other Ambulatory Visit (HOSPITAL_COMMUNITY): Payer: Medicare Other

## 2014-02-02 DIAGNOSIS — I4891 Unspecified atrial fibrillation: Secondary | ICD-10-CM

## 2014-02-03 ENCOUNTER — Ambulatory Visit (HOSPITAL_COMMUNITY)
Admission: RE | Admit: 2014-02-03 | Discharge: 2014-02-03 | Disposition: A | Discharge status: Home / Self Care | Payer: Medicare Other | Source: Ambulatory Visit | Attending: Cardiovascular Disease | Admitting: Cardiovascular Disease

## 2014-02-03 DIAGNOSIS — I359 Nonrheumatic aortic valve disorder, unspecified: Secondary | ICD-10-CM

## 2014-02-03 DIAGNOSIS — I4891 Unspecified atrial fibrillation: Secondary | ICD-10-CM | POA: Insufficient documentation

## 2014-02-03 NOTE — Progress Notes (Signed)
2D Echocardiogram Complete.  02/03/2014   Steven Saunders, [place]

## 2014-02-22 ENCOUNTER — Encounter: Payer: Self-pay | Admitting: *Deleted

## 2014-02-24 ENCOUNTER — Telehealth: Payer: Self-pay | Admitting: Internal Medicine

## 2014-02-24 NOTE — Telephone Encounter (Signed)
refaxed again.  original sent on 6/24.  Patient aware

## 2014-02-24 NOTE — Telephone Encounter (Signed)
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

## 2014-04-04 ENCOUNTER — Ambulatory Visit: Payer: Self-pay | Admitting: Gastroenterology

## 2014-04-06 LAB — PATHOLOGY REPORT

## 2014-11-01 ENCOUNTER — Ambulatory Visit (INDEPENDENT_AMBULATORY_CARE_PROVIDER_SITE_OTHER): Payer: Medicare Other | Admitting: Internal Medicine

## 2014-11-01 ENCOUNTER — Encounter: Payer: Self-pay | Admitting: Internal Medicine

## 2014-11-01 VITALS — BP 162/89 | HR 75 | Ht 72.0 in | Wt 255.2 lb

## 2014-11-01 DIAGNOSIS — I10 Essential (primary) hypertension: Secondary | ICD-10-CM | POA: Diagnosis not present

## 2014-11-01 DIAGNOSIS — I482 Chronic atrial fibrillation, unspecified: Secondary | ICD-10-CM

## 2014-11-01 NOTE — Assessment & Plan Note (Signed)
His blood pressure is elevated. He is taking Mucinex D. I have asked him to check his pressure once he is off of this medication. If the pressures remain elevated, he is instructed to call us and we will switch from metoprolol to coreg. He is encouraged to maintain a low sodium diet.

## 2014-11-01 NOTE — Assessment & Plan Note (Signed)
He now has 2 risk factors for stroke. I have recommended he stop ASA and start either warfarin or a NOAC. He is not interested in starting either and understands that he could have a stroke. He will continue ASA but I have told him that ASA does not provide adequate protection against stroke.

## 2014-11-01 NOTE — Progress Notes (Signed)
HPI Steven Saunders returns today for followup. He is a very pleasant 64 year old man with a history of hypertension, and atrial fibrillation. He continues to do well. He has retired from working full time and now works 3 days a week at the [employer]. He denies chest pain, sob, or sincope. No edema. He admits to some dietary indiscretion and has not been able to lose weight. He notes that he is getting married and in the last year gained weight but also lost weight.  No Known Allergies   Current Outpatient Prescriptions  Medication Sig Dispense Refill  . aspirin EC 81 MG tablet Take 1 tablet (81 mg total) by mouth daily. 90 tablet 3  . digoxin (LANOXIN) 0.25 MG tablet Take 250 mcg by mouth daily.      Marland Kitchen docusate (COLACE) 60 MG/15ML syrup Take 60 mg by mouth daily.      . Glucosamine 500 MG CAPS Take 500 mg by mouth daily.     Marland Kitchen loratadine (CLARITIN) 10 MG tablet Take 10 mg by mouth daily.      Marland Kitchen lovastatin (MEVACOR) 20 MG tablet Take 20 mg by mouth at bedtime.      . meloxicam (MOBIC) 15 MG tablet Take 15 mg by mouth daily.    . metoprolol-hydrochlorothiazide (LOPRESSOR HCT) 50-25 MG per tablet Take 1 tablet by mouth daily.      . Multiple Vitamin (MULTIVITAMIN) tablet Take 1 tablet by mouth daily.      . tadalafil (CIALIS) 20 MG tablet Take 20 mg by mouth daily as needed.      . valACYclovir (VALTREX) 1000 MG tablet Take 1 tablet by mouth as needed.    . vitamin B-12 (CYANOCOBALAMIN) 100 MCG tablet Take 100 mcg by mouth daily.       No current facility-administered medications for this visit.     Past Medical History  Diagnosis Date  . Hypertension   . Mild obesity   . Atrial fibrillation     chronic  . Other and unspecified hyperlipidemia   . Benign neoplasm of colon   . Other abnormal glucose   . Enthesopathy of hip region   . Osteoarthrosis, unspecified whether generalized or localized, pelvic region and thigh     ROS:   All systems reviewed and negative except as noted in the  HPI.   Past Surgical History  Procedure Laterality Date  . Appendectomy    . Hernia repair    . Cardiac catheterization  2007  . Colonoscopy       Family History  Problem Relation Age of Onset  . Hypertension Mother   . Heart attack Father      History   Social History  . Marital Status: Divorced    Spouse Name: N/A  . Number of Children: N/A  . Years of Education: N/A   Occupational History  . Not on file.   Social History Main Topics  . Smoking status: Former Smoker    Quit date: 11/12/2009  . Smokeless tobacco: Never Used  . Alcohol Use: 1.8 oz/week    3 Cans of beer per week     Comment: daily  . Drug Use: No  . Sexual Activity: Not on file   Other Topics Concern  . Not on file   Social History Narrative  . No narrative on file     There were no vitals taken for this visit.  Physical Exam:  Well appearing obese, middle-aged man, NAD HEENT: Unremarkable Neck:  6 cm JVD, no thyromegally Lungs:  Clear with no wheezes, rales, or rhonchi. HEART:  IRegular rate rhythm, no murmurs, no rubs, no clicks Abd:  soft, positive bowel sounds, no organomegally, no rebound, no guarding Ext:  2 plus pulses, no edema, no cyanosis, no clubbing Skin:  No rashes no nodules Neuro:  CN II through XII intact, motor grossly intact  EKG - atrial fibrillation with a controlled ventricular response   Assess/Plan:

## 2014-11-01 NOTE — Assessment & Plan Note (Signed)
His weight is improved. He is encouraged to lose more weight.

## 2014-11-01 NOTE — Patient Instructions (Signed)
Your physician recommends that you continue on your current medications as directed. Please refer to the Current Medication list given to you today.  Your physician wants you to follow-up in: 1 year with Dr. Lovena Le. You will receive a reminder letter in the mail two months in advance. If you don't receive a letter, please call our office to schedule the follow-up appointment.  If blood pressure remains elevated after you finish your cold medication (Mucinex D) please call our office.

## 2014-12-22 NOTE — Discharge Summary (Signed)
PATIENT NAME:  Steven Saunders, Steven Saunders MR#:  [id number] DATE OF BIRTH:  02-May-1951  DATE OF ADMISSION:  06/07/2013 DATE OF DISCHARGE: 06/10/2013   FINAL DIAGNOSES:  1.  Advanced osteoarthritis, left hip. 2.  Chronic atrial fibrillation. 3.  High cholesterol. 4.  High blood pressure.   OPERATION: 06/07/2013, cementless DePuy AML total hip replacement.   COMPLICATIONS: None.   CONSULTATIONS: None.   DISCHARGE MEDICATIONS:  1.  Enteric-coated 1 p.o. b.i.d.  2.  Mobic 15 mg daily. 3.  Neurontin 400 mg b.i.d.  4.  Norco 7.5/325 mg q.6 hours p.r.n. pain. 5.  Lovastatin as prior to admission. 6.  Cialis as prior to admission. 7.  Metoprolol as prior to admission. 8.  Digoxin as prior to admission.   HISTORY: The patient is a 64 year old male with a history of severe osteoarthritis of the left hip for several years. He had been treated with anti-inflammatory medications. He exercises and rests without relief. He had reached the point where the pain interfered with normal daily activities and sleep. He requested left total hip replacement after thorough discussion of the risks and benefits of surgery and postoperative protocol.   PAST MEDICAL HISTORY/ILLNESSES: As above.   ALLERGIES: None.   MEDICATIONS: As above.   OPERATIONS:  1.  Hernia repair.  2.  Appendectomy.   REVIEW OF SYSTEMS: Unremarkable.   FAMILY HISTORY: Unremarkable.   SOCIAL HISTORY: The patient does not smoke, but did 3 years in the past. He is divorced. He is retired.   PHYSICAL EXAMINATION: Healthy male in no distress. He is 6 feet tall, 236 pounds. The left hip showed severe pain and restricted motion with slight shortening.   LABORATORY DATA: On admission was satisfactory.   HOSPITAL COURSE: On 06/07/2013, he underwent cementless left total hip replacement. Postoperatively, he did extremely well. Hemoglobin dropped to 13.1 on the first postoperative day and then was 13.3 on the second postoperative day. The wound  remained benign. He did have a very cloudy urine and was kept on p.o. antibiotics for 2 days after the IV antibiotics had finished. Cultures so far are negative. He made good progress with PT. He is ready for discharge to home with home PT today and will be seen in my office in 2 weeks for exam. He will avoid excessive flexion or rotation of the hip.   ____________________________ Park Breed, MD hem:aw D: 06/10/2013 13:04:29 ET T: 06/10/2013 13:12:55 ET JOB#: [id number]  cc: Park Breed, MD, <Dictator> [place] Caryn Section, MD Park Breed MD ELECTRONICALLY SIGNED 06/11/2013 14:22

## 2014-12-22 NOTE — H&P (Signed)
PATIENT NAME:  Steven Saunders, Steven Saunders MR#:  [id number] DATE OF BIRTH:  Jul 21, 1951  DATE OF ADMISSION:  06/07/2013  CHIEF COMPLAINT: Severe pain, left hip.   HISTORY OF PRESENT ILLNESS: The patient is a 64 year old male with a history of severe osteoarthritis of the left hip for several years. He has been treated with anti-inflammatory medication, exercise and rest. X-rays have shown progressive worsening of arthritis in the hip to the point where now the hip is completely destroyed with no joint visible. There is severe cyst formation and sclerosis. There is subluxation. He has constant pain that interferes with all his activities and sleep. He wishes to proceed with hip replacement. The risks and benefits of his surgery have been discussed with him at length.   PAST MEDICAL HISTORY: Illnesses: He has high cholesterol, atrial fibrillation and high blood pressure.   ALLERGIES: No known drug allergies.   MEDICATIONS: Aspirin, lovastatin, Cialis, metoprolol and digoxin.   PAST SURGICAL HISTORY: Hernia repair and appendectomy.   REVIEW OF SYSTEMS: Unremarkable.   FAMILY HISTORY: Unremarkable.   SOCIAL HISTORY: The patient does not smoke but did for years in the past. He is divorced.   PHYSICAL EXAMINATION:  GENERAL: He is a healthy male in no distress. He is 6 feet tall, 236 pounds. HEAD AND NECK: Normal.  HEART: Regular without murmur, rub or gallop.  ABDOMEN: Soft, nontender.  LUNGS: Clear.  SKIN: Without lesion.  NEUROLOGIC: Normal.   ORTHOPEDIC EXAMINATION: Shows flexion on the left hip of 85 degrees, internal rotation of 5 degrees, and external rotation of 20 degrees. He has severe limp when he walks. He has mild sacroiliac joint tenderness. Back is nontender. Right hip has full motion without pain. Leg length shows minimal shortening on the left. Leg lies in external rotation.   IMPRESSION: Advanced osteoarthritis of the left hip.   PLAN: The patient is being admitted for left total hip  replacement. The risks and benefits and postoperative protocol have been discussed with the patient at length.  ____________________________ Park Breed, MD hem:sb D: 06/06/2013 15:53:00 ET T: 06/06/2013 16:28:21 ET JOB#: [id number]  cc: Park Breed, MD, <Dictator> Park Breed MD ELECTRONICALLY SIGNED 06/07/2013 11:43

## 2014-12-22 NOTE — Op Note (Signed)
PATIENT NAME:  Steven Saunders, Steven Saunders MR#:  [id number] DATE OF BIRTH:  1951-07-09  DATE OF PROCEDURE:  06/07/2013  PREOPERATIVE DIAGNOSIS:  Severe osteoarthritis of the left hip.  POSTOPERATIVE DIAGNOSIS:  Severe osteoarthritis of the left hip.   PROCEDURE PERFORMED: Cementless AML left total hip replacement (16.5 mm narrow femoral stem, 56 mm series 300 metal cup, 56 mm neutral/+4 polyethylene liner, 36 mm/8.5 mm head/neck combination).   SURGEON:  Park Breed, MD   ASSISTANT:  Tamala Julian, M.D.   ANESTHESIA:  Spinal.   COMPLICATIONS:  None.   DRAINS:  Two Autovac.   ESTIMATED BLOOD LOSS:  350 mL, replaced none.  DESCRIPTION OF PROCEDURE:  The patient was brought to the operating room where he underwent satisfactory spinal anesthesia was placed in the lower right lateral decubitus position and padded appropriately on the operating room table and stabilized by hip grips. The left hip was prepped and draped in sterile fashion and a posterolateral incision made. Dissection carried out sharply through the subcutaneous tissue and fascia and Charnley was inserted. Short external rotators were divided and tagged, posterior capsule was opened. Prior to this, a Steinmann pin was placed in the acetabulum and bent over and the trochanteric tagged had to identify preoperative leg length. The capsule was opened and tagged. The hip was dislocated and the femoral head was amputated with an oscillating saw. The soft tissue debridement was completed. The acetabulum was exposed and the labrum removed. The soft tissue was removed from the fossa. The femoral head measured about 51 mm in size. Reaming started at this size and then continued up to 55 mm. The medialization was carried out and a trial 56 mm cup was inserted and seated very snugly with closure and abduction. Trial liner was inserted. The femoral canal was opened and reamed to 16 mm.  It was then sequentially rasped from 13.5 to 16.5 mm narrow stem. Trial  reductions were carried out with several leg lengths and the 8.5 mm length appeared to re-create the best length and stability. The patient initially was slightly short and we lengthened him in 2 or 3 mm. The Steinmann pin verified this. The hip was quite stable throughout all movements. The trial acetabular components were removed and the acetabulum thoroughly irrigated. A series 300, 3 spike, 56 mm cup was inserted with about 45 degrees of abduction and 20 degrees of anteversion, seated fully and was quite snug. The trial liner was inserted and the hip was rechecked for length and stability and was excellent. This trial was removed and the neutral +4, 56 mm polyethylene was inserted. The femoral stem was removed and a 16.5 mm narrow femoral stem inserted. Trial reduction was then again carried out, which was again stable and showed an excellent length. The final 36 mm head with a +8.5 neck length was attached and the hip was relocated for a final time. It looked to be in excellent position and stability was excellent. The wound was thoroughly irrigated throughout the procedure and electrocautery was used for hemostasis. The posterior capsule was closed with #2 Tycron suture. The short external rotators were repaired with a similar suture. The capsule was closed with #2 Brion Aliment over an Autovac drain and the subcutaneous tissue was closed with #0 Quill over another Autovac drain. The skin was closed with staples. Dry sterile dressings were applied over TENS pads. The Autovac was activated. The patient was then turned onto his back and transferred to the hospital bed and he tolerated the  procedure well. Leg lengths appeared to be excellent. The hip was stable. He was taken to recovery in good condition.   ____________________________ Park Breed, MD hem:ce D: 06/07/2013 11:14:09 ET T: 06/07/2013 11:40:11 ET JOB#: [id number]  cc: Park Breed, MD, <Dictator> Park Breed MD ELECTRONICALLY SIGNED  06/08/2013 10:43

## 2015-01-08 DIAGNOSIS — R7309 Other abnormal glucose: Secondary | ICD-10-CM | POA: Diagnosis not present

## 2015-01-08 DIAGNOSIS — I10 Essential (primary) hypertension: Secondary | ICD-10-CM | POA: Diagnosis not present

## 2015-01-08 DIAGNOSIS — Z0000 Encounter for general adult medical examination without abnormal findings: Secondary | ICD-10-CM | POA: Diagnosis not present

## 2015-01-08 DIAGNOSIS — R739 Hyperglycemia, unspecified: Secondary | ICD-10-CM | POA: Diagnosis not present

## 2015-01-08 DIAGNOSIS — Z125 Encounter for screening for malignant neoplasm of prostate: Secondary | ICD-10-CM | POA: Diagnosis not present

## 2015-01-08 DIAGNOSIS — E785 Hyperlipidemia, unspecified: Secondary | ICD-10-CM | POA: Diagnosis not present

## 2015-01-08 LAB — LIPID PANEL
CHOLESTEROL: 167 mg/dL (ref 0–200)
HDL: 46 mg/dL (ref 35–70)
LDL CALC: 79 mg/dL
TRIGLYCERIDES: 211 mg/dL — AB (ref 40–160)

## 2015-01-08 LAB — BASIC METABOLIC PANEL
BUN: 19 mg/dL (ref 4–21)
CREATININE: 1 mg/dL (ref 0.6–1.3)
Glucose: 122 mg/dL
Potassium: 4.5 mmol/L (ref 3.4–5.3)
Sodium: 137 mmol/L (ref 137–147)

## 2015-01-08 LAB — HEMOGLOBIN A1C: HEMOGLOBIN A1C: 5.6

## 2015-01-08 LAB — PSA: PSA: 4.1

## 2015-01-31 ENCOUNTER — Telehealth: Payer: Self-pay | Admitting: Family Medicine

## 2015-01-31 NOTE — Telephone Encounter (Signed)
Patient had elevated PSA at physical in May. He needs to PSA rechecked this week or next week. Please enter order for PSA, diagnosis elevated PSA. Please advise patient to pick up lab order.

## 2015-03-12 ENCOUNTER — Telehealth: Payer: Self-pay | Admitting: Internal Medicine

## 2015-03-12 NOTE — Telephone Encounter (Signed)
Walk in pt form-[employer]-paper dropped off will give to [facility] tomorrow (03/13/15)

## 2015-03-20 ENCOUNTER — Telehealth: Payer: Self-pay | Admitting: Internal Medicine

## 2015-03-20 NOTE — Telephone Encounter (Signed)
[employer]pt [employer] paper signed & Completed pt aware.

## 2015-03-22 ENCOUNTER — Telehealth: Payer: Self-pay | Admitting: Internal Medicine

## 2015-03-22 NOTE — Telephone Encounter (Signed)
Dept of [employer] paper picked up by pt.

## 2015-03-27 ENCOUNTER — Other Ambulatory Visit: Payer: Self-pay | Admitting: Family Medicine

## 2015-04-26 ENCOUNTER — Other Ambulatory Visit: Payer: Self-pay | Admitting: Family Medicine

## 2015-06-25 ENCOUNTER — Telehealth: Payer: Self-pay

## 2015-06-25 NOTE — Telephone Encounter (Signed)
Please ask the patient to follow up his blood pressure with his primary MD. My clinic is not set up to adjust blood pressure meds.

## 2015-06-25 NOTE — Telephone Encounter (Signed)
Patient called in requesting an increase or change in BP medication.  He states that he was told to call if his BP was elevated.  His BP was recently 156/104 taken manually by a wellness nurse at his work.  Please advise.

## 2015-07-03 MED ORDER — CARVEDILOL 12.5 MG PO TABS: 12.5000 mg | ORAL_TABLET | Freq: Two times a day (BID) | ORAL | Status: DC

## 2015-07-03 NOTE — Telephone Encounter (Signed)
Discusseda gain wit Dr Bess Harvest nd showed him his note in regards to changing to Carvedilol.  He recommends changing to Carvedilol 12.5mg  bid.  Patient aware and will track BP's after the change and let us know if still elevated.

## 2015-07-03 NOTE — Addendum Note (Signed)
Addended by: Janan Halter F on: 07/03/2015 09:37 AM   Modules accepted: Orders, Medications

## 2015-07-24 ENCOUNTER — Ambulatory Visit (INDEPENDENT_AMBULATORY_CARE_PROVIDER_SITE_OTHER): Payer: Medicare Other | Admitting: Family Medicine

## 2015-07-24 ENCOUNTER — Encounter: Payer: Self-pay | Admitting: Family Medicine

## 2015-07-24 VITALS — BP 150/90 | HR 66 | Temp 98.3°F | Resp 16 | Wt 264.0 lb

## 2015-07-24 DIAGNOSIS — J40 Bronchitis, not specified as acute or chronic: Secondary | ICD-10-CM

## 2015-07-24 DIAGNOSIS — J069 Acute upper respiratory infection, unspecified: Secondary | ICD-10-CM | POA: Diagnosis not present

## 2015-07-24 MED ORDER — AZITHROMYCIN 250 MG PO TABS: ORAL_TABLET | ORAL | Status: AC

## 2015-07-24 MED ORDER — FLUTICASONE PROPIONATE 50 MCG/ACT NA SUSP: 2.0000 | Freq: Every day | NASAL | Status: DC

## 2015-07-24 NOTE — Progress Notes (Signed)
Patient: Steven Saunders Male    DOB: Nov 23, 1950   64 y.o.   MRN: [id number] Visit Date: 07/24/2015  Today's Provider: Lelon Huh, MD   Chief Complaint  Patient presents with  . Cough   Subjective:    Cough This is a new problem. The current episode started in the past 7 days (about 3 days). The problem has been gradually worsening. The problem occurs every few minutes. The cough is productive of sputum. Associated symptoms include chills, ear congestion, nasal congestion, postnasal drip, shortness of breath and wheezing. Pertinent negatives include no chest pain, ear pain, fever, headaches, heartburn, rash, sore throat or sweats. The symptoms are aggravated by lying down (heated rooms). Treatments tried: alka-selzer and mucenex. The treatment provided no relief.      No Known Allergies Previous Medications   ASPIRIN EC 81 MG TABLET    Take 1 tablet (81 mg total) by mouth daily.   CARVEDILOL (COREG) 12.5 MG TABLET    Take 1 tablet (12.5 mg total) by mouth 2 (two) times daily with a meal.   DIGOXIN (LANOXIN) 0.25 MG TABLET    Take 250 mcg by mouth daily.     DOCUSATE (COLACE) 60 MG/15ML SYRUP    Take 60 mg by mouth daily.     LORATADINE (CLARITIN) 10 MG TABLET    Take 10 mg by mouth daily.     LOVASTATIN (MEVACOR) 20 MG TABLET    TAKE 1 TABLET EVERY DAY USUALLY IN THE EVENING   MULTIPLE VITAMIN (MULTIVITAMIN) TABLET    Take 1 tablet by mouth daily.     NAPROXEN SODIUM (ALEVE PO)    Take by mouth as needed (for headaches, pain etc...).   TADALAFIL (CIALIS) 20 MG TABLET    Take 20 mg by mouth daily as needed.     VALACYCLOVIR (VALTREX) 1000 MG TABLET    Take 1 tablet by mouth as needed.    Review of Systems  Constitutional: Positive for chills. Negative for fever.  HENT: Positive for congestion, postnasal drip, sinus pressure and sneezing. Negative for ear pain and sore throat.   Respiratory: Positive for cough, shortness of breath and wheezing.   Cardiovascular:  Negative for chest pain and palpitations.  Gastrointestinal: Negative for heartburn.  Skin: Negative for rash.  Neurological: Negative for dizziness, light-headedness and headaches.    Social History  Substance Use Topics  . Smoking status: Former Smoker    Quit date: 11/12/2009  . Smokeless tobacco: Never Used  . Alcohol Use: 1.8 oz/week    3 Cans of beer per week     Comment: daily   Objective:   BP 150/90 mmHg  Pulse 66  Temp(Src) 98.3 F (36.8 C) (Oral)  Resp 16  Wt 264 lb (119.75 kg)  SpO2 98%  Physical Exam  General Appearance:    Alert, cooperative, no distress  HENT:   bilateral TM normal without fluid or infection, neck without nodes, throat normal without erythema or exudate, sinuses nontender and nasal mucosa pale and congested  Eyes:    PERRL, conjunctiva/corneas clear, EOM's intact       Lungs:    Occasional expiratory wheezes, no rales respirations unlabored  Heart:    Regular rate and rhythm  Neurologic:   Awake, alert, oriented x 3. No apparent focal neurological           defect.          Assessment & Plan:  1. Bronchitis  - azithromycin (ZITHROMAX) 250 MG tablet; 2 by mouth today, then 1 daily for 4 days  Dispense: 6 tablet; Refill: 0   2. Upper respiratory infection Counseled regarding signs and symptoms of viral and bacterial respiratory infections. Advised to fill azithromycin above if he develops any sign of bacterial infection, or if current symptoms last longer than 10 days.   - fluticasone (FLONASE) 50 MCG/ACT nasal spray; Place 2 sprays into both nostrils daily.  Dispense: 16 g; Refill: 6      Lelon Huh, MD  [facility]

## 2015-08-22 ENCOUNTER — Other Ambulatory Visit: Payer: Self-pay | Admitting: Family Medicine

## 2015-10-27 DIAGNOSIS — I10 Essential (primary) hypertension: Secondary | ICD-10-CM | POA: Diagnosis not present

## 2015-10-27 DIAGNOSIS — J0100 Acute maxillary sinusitis, unspecified: Secondary | ICD-10-CM | POA: Diagnosis not present

## 2015-10-27 DIAGNOSIS — R0982 Postnasal drip: Secondary | ICD-10-CM | POA: Diagnosis not present

## 2015-11-01 ENCOUNTER — Encounter: Payer: Self-pay | Admitting: Internal Medicine

## 2015-11-01 ENCOUNTER — Ambulatory Visit (INDEPENDENT_AMBULATORY_CARE_PROVIDER_SITE_OTHER): Payer: PPO | Admitting: Internal Medicine

## 2015-11-01 VITALS — BP 150/92 | HR 73 | Ht 72.0 in | Wt 254.6 lb

## 2015-11-01 DIAGNOSIS — I482 Chronic atrial fibrillation, unspecified: Secondary | ICD-10-CM

## 2015-11-01 MED ORDER — DIGOXIN 125 MCG PO TABS: 0.1250 mg | ORAL_TABLET | Freq: Every day | ORAL | Status: DC

## 2015-11-01 MED ORDER — CARVEDILOL 12.5 MG PO TABS: 18.7500 mg | ORAL_TABLET | Freq: Two times a day (BID) | ORAL | Status: DC

## 2015-11-01 NOTE — Progress Notes (Signed)
HPI Steven Saunders returns today for followup. He is a very pleasant 65 year old man with a history of hypertension, and atrial fibrillation. He continues to do well. 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 He denies chest pain, sob, or sincope. No edema. He admits to some dietary indiscretion and has not been able to lose weight.    No Known Allergies   Current Outpatient Prescriptions  Medication Sig Dispense Refill  . amoxicillin (AMOXIL) 875 MG tablet Take 1 tablet by mouth 2 (two) times daily.  0  . aspirin EC 81 MG tablet Take 1 tablet (81 mg total) by mouth daily. 90 tablet 3  . benzonatate (TESSALON) 100 MG capsule Take 1 capsule by mouth 2 (two) times daily.  0  . carvedilol (COREG) 12.5 MG tablet Take 1 tablet (12.5 mg total) by mouth 2 (two) times daily with a meal. 180 tablet 3  . digoxin (LANOXIN) 0.25 MG tablet TAKE 1 TABLET EVERY DAY 30 tablet 12  . docusate (COLACE) 60 MG/15ML syrup Take 60 mg by mouth daily.      . fluticasone (FLONASE) 50 MCG/ACT nasal spray Place 2 sprays into both nostrils daily. 16 g 6  . loratadine (CLARITIN) 10 MG tablet Take 10 mg by mouth daily.      Marland Kitchen lovastatin (MEVACOR) 20 MG tablet TAKE 1 TABLET EVERY DAY USUALLY IN THE EVENING 30 tablet 11  . Multiple Vitamin (MULTIVITAMIN) tablet Take 1 tablet by mouth daily.      . Naproxen Sodium (ALEVE PO) Take by mouth as needed (for headaches, pain etc...).    Marland Kitchen tadalafil (CIALIS) 20 MG tablet Take 20 mg by mouth daily as needed.      . valACYclovir (VALTREX) 1000 MG tablet Take 1 tablet by mouth as needed.     No current facility-administered medications for this visit.     Past Medical History  Diagnosis Date  . Hypertension   . Mild obesity   . Atrial fibrillation (HCC)     chronic  . Other and unspecified hyperlipidemia   . Benign neoplasm of colon   . Other abnormal glucose   . Enthesopathy of hip region   . Osteoarthrosis,  unspecified whether generalized or localized, pelvic region and thigh     ROS:   All systems reviewed and negative except as noted in the HPI.   Past Surgical History  Procedure Laterality Date  . Appendectomy    . Hernia repair    . Cardiac catheterization  2007  . Colonoscopy       Family History  Problem Relation Age of Onset  . Hypertension Mother   . Heart attack Father      Social History   Social History  . Marital Status: Divorced    Spouse Name: N/A  . Number of Children: N/A  . Years of Education: N/A   Occupational History  . Not on file.   Social History Main Topics  . Smoking status: Former Smoker    Quit date: 11/12/2009  . Smokeless tobacco: Never Used  . Alcohol Use: 1.8 oz/week    3 Cans of beer per week     Comment: daily  . Drug Use: No  . Sexual Activity: Not on file   Other Topics Concern  . Not on file   Social History Narrative     BP 150/92 mmHg  Pulse 73  Ht 6' (  1.829 m)  Wt 254 lb 9.6 oz (115.486 kg)  BMI 34.52 kg/m2  Physical Exam:  Well appearing obese, middle-aged man, NAD HEENT: Unremarkable Neck:  6 cm JVD, no thyromegally Lungs:  Clear with no wheezes, rales, or rhonchi. HEART:  IRegular rate rhythm, no murmurs, no rubs, no clicks Abd:  soft, positive bowel sounds, no organomegally, no rebound, no guarding Ext:  2 plus pulses, no edema, no cyanosis, no clubbing Skin:  No rashes no nodules Neuro:  CN II through XII intact, motor grossly intact  EKG - atrial fibrillation with a controlled ventricular response   Assess/Plan: 1. Atrial fib - his rate is well controlled. He will continue his current meds. He continues to refuse systemic anti-coagulation. I have warned him about the danger of stroke. 2. HTN - his blood pressure has been high. I have recommended that he increase his coreg. Will back off on the digoxin.  3. Coags - he continues to refuse oral anti-coagulants. He will continue ASA 4. Obesity - he is  encouraged to exercise and to lose weight.  Mikle Bosworth.D.

## 2015-11-01 NOTE — Patient Instructions (Addendum)
Medication Instructions:  Your physician has recommended you make the following change in your medication:  1) Decrease Digoxin to 1/2 tablet daily---new prescription called in for 0.125mg  daily 2) Increase Carvedilol to 1 1/2 tablets twice daily    Labwork: None ordered   Testing/Procedures: None ordered   Follow-Up: Your physician wants you to follow-up in: 12 months with Dr Knox Saliva will receive a reminder letter in the mail two months in advance. If you don't receive a letter, please call our office to schedule the follow-up appointment.   Any Other Special Instructions Will Be Listed Below (If Applicable).     If you need a refill on your cardiac medications before your next appointment, please call your pharmacy.

## 2015-12-20 ENCOUNTER — Telehealth: Payer: Self-pay | Admitting: Internal Medicine

## 2015-12-20 NOTE — Telephone Encounter (Signed)
New message    Patient calling    Pt C/O medication issue:  1. Name of Medication: digoxin & new carvedilol    2. How are you currently taking this medication (dosage and times per day)? carvedilol 12.5 mg - 1/2 tab  twice daily // digoxin  0.125 mg   3. Are you having a reaction (difficulty breathing--STAT)? No problem    4. What is your medication issue? Should dosage of medication be change  / blood pressure is going 150/92 pulse  73 . Average  180/100 pule  80

## 2015-12-21 MED ORDER — CARVEDILOL 25 MG PO TABS: 25.0000 mg | ORAL_TABLET | Freq: Two times a day (BID) | ORAL | Status: DC

## 2015-12-21 NOTE — Telephone Encounter (Signed)
Discussed with Dr Lovena Le will increase the Carvedilol to 25 mg bid.  I spoke with patient and called in new prescription for him

## 2016-01-08 DIAGNOSIS — Z860101 Personal history of adenomatous and serrated colon polyps: Secondary | ICD-10-CM | POA: Insufficient documentation

## 2016-01-08 DIAGNOSIS — Z86010 Personal history of colonic polyps: Secondary | ICD-10-CM | POA: Insufficient documentation

## 2016-01-08 DIAGNOSIS — M76899 Other specified enthesopathies of unspecified lower limb, excluding foot: Secondary | ICD-10-CM | POA: Insufficient documentation

## 2016-01-08 DIAGNOSIS — Z87891 Personal history of nicotine dependence: Secondary | ICD-10-CM | POA: Insufficient documentation

## 2016-01-08 DIAGNOSIS — E785 Hyperlipidemia, unspecified: Secondary | ICD-10-CM | POA: Insufficient documentation

## 2016-01-08 DIAGNOSIS — R7309 Other abnormal glucose: Secondary | ICD-10-CM | POA: Insufficient documentation

## 2016-01-08 DIAGNOSIS — M169 Osteoarthritis of hip, unspecified: Secondary | ICD-10-CM | POA: Insufficient documentation

## 2016-01-08 DIAGNOSIS — N529 Male erectile dysfunction, unspecified: Secondary | ICD-10-CM

## 2016-01-10 ENCOUNTER — Ambulatory Visit (INDEPENDENT_AMBULATORY_CARE_PROVIDER_SITE_OTHER): Payer: PPO | Admitting: Family Medicine

## 2016-01-10 ENCOUNTER — Encounter: Payer: Self-pay | Admitting: Family Medicine

## 2016-01-10 VITALS — BP 144/86 | HR 73 | Temp 98.0°F | Resp 16 | Ht 72.25 in | Wt 258.0 lb

## 2016-01-10 DIAGNOSIS — E785 Hyperlipidemia, unspecified: Secondary | ICD-10-CM

## 2016-01-10 DIAGNOSIS — Z0000 Encounter for general adult medical examination without abnormal findings: Secondary | ICD-10-CM | POA: Diagnosis not present

## 2016-01-10 DIAGNOSIS — I482 Chronic atrial fibrillation, unspecified: Secondary | ICD-10-CM

## 2016-01-10 DIAGNOSIS — Z23 Encounter for immunization: Secondary | ICD-10-CM

## 2016-01-10 DIAGNOSIS — I10 Essential (primary) hypertension: Secondary | ICD-10-CM | POA: Diagnosis not present

## 2016-01-10 DIAGNOSIS — D225 Melanocytic nevi of trunk: Secondary | ICD-10-CM

## 2016-01-10 DIAGNOSIS — Z1159 Encounter for screening for other viral diseases: Secondary | ICD-10-CM

## 2016-01-10 NOTE — Progress Notes (Signed)
Patient: Steven Saunders, Male    DOB: 22-Sep-1950, 65 y.o.   MRN: [id number] Visit Date: 01/10/2016  Today's Provider: Lelon Huh, MD   Chief Complaint  Patient presents with  . Annual Exam  . Hypertension    follow up   . Hyperlipidemia    follow up   . Atrial Fibrillation    follow up    Subjective:    Annual physical  Steven Saunders is a 65 y.o. male. He feels well. He reports exercising a few times a week at home. He reports he is sleeping well.  -----------------------------------------------------------  Hypertension, follow-up:  BP Readings from Last 3 Encounters:  11/01/15 150/92  07/24/15 150/90  11/01/14 162/89    He was last seen for hypertension 1 years ago.  BP at that visit was 140/90. Management since that visit includes no changes. He reports good compliance with treatment. He is not having side effects.  He is exercising. He is adherent to low salt diet.   Outside blood pressures are 170/105. He is experiencing none.  Patient denies chest pain, chest pressure/discomfort, claudication, dyspnea, exertional chest pressure/discomfort, fatigue, irregular heart beat, lower extremity edema, near-syncope, orthopnea, palpitations, paroxysmal nocturnal dyspnea, syncope and tachypnea.   Cardiovascular risk factors include advanced age (older than 36 for men, 68 for women) and hypertension.  Use of agents associated with hypertension: NSAIDS.     Weight trend: fluctuating a bit Wt Readings from Last 3 Encounters:  11/01/15 254 lb 9.6 oz (115.486 kg)  07/24/15 264 lb (119.75 kg)  11/01/14 255 lb 3.2 oz (115.758 kg)    Current diet: well balanced  ------------------------------------------------------------------------   Lipid/Cholesterol, Follow-up:   Last seen for this1 years ago.  Management changes since that visit include none. . Last Lipid Panel:    Component Value Date/Time   CHOL 167 01/08/2015   TRIG 211* 01/08/2015   HDL 46 01/08/2015   [place] 79 01/08/2015    Risk factors for vascular disease include hypercholesterolemia and hypertension  He reports good compliance with treatment. He is not having side effects.  Current symptoms include none and have been stable. Weight trend: fluctuating a bit Prior visit with dietician: no Current diet: well balanced Current exercise: yard work  [employer] from Last 3 Encounters:  11/01/15 254 lb 9.6 oz (115.486 kg)  07/24/15 264 lb (119.75 kg)  11/01/14 255 lb 3.2 oz (115.758 kg)    ------------------------------------------------------------------- Follow up Atrial Fibrillation: Last office visit was 1 year ago and no changes were made. Patient was to continue follow up with Dr. Lovena Le. Last visit was in March and had digoxin reduced, and doubled dose fo carvedilol. Is tolerating well.   Had some sinus congestion over the last few weeks and felt a little forgetful and foggy headed. He took some left over amoxicillin and OTC loratadine and feels back to his baseline now.  Review of Systems  Constitutional: Negative for fever, chills, appetite change and fatigue.  HENT: Negative for congestion, ear pain, hearing loss, nosebleeds and trouble swallowing.   Eyes: Negative for pain and visual disturbance.  Respiratory: Negative for cough, chest tightness and shortness of breath.   Cardiovascular: Negative for chest pain, palpitations and leg swelling.  Gastrointestinal: Negative for nausea, vomiting, abdominal pain, diarrhea, constipation and blood in stool.  Endocrine: Negative for polydipsia, polyphagia and polyuria.  Genitourinary: Negative for dysuria and flank pain.  Musculoskeletal: Negative for myalgias, back pain, joint swelling, arthralgias and  neck stiffness.  Skin: Negative for color change, rash and wound.  Neurological: Negative for dizziness, tremors, seizures, speech difficulty, weakness, light-headedness and headaches.    Psychiatric/Behavioral: Negative for behavioral problems, confusion, sleep disturbance, dysphoric mood and decreased concentration. The patient is not nervous/anxious.   All other systems reviewed and are negative.   Social History   Social History  . Marital Status: Married    Spouse Name: N/A  . Number of Children: 2  . Years of Education: N/A   Occupational History  . Retired     Still drives school bus   Social History Main Topics  . Smoking status: Former Smoker -- 2.00 packs/day for 15 years    Types: Cigarettes    Quit date: 11/12/2009  . Smokeless tobacco: Never Used  . Alcohol Use: 1.8 oz/week    3 Cans of beer per week     Comment: daily  . Drug Use: No  . Sexual Activity: Not on file   Other Topics Concern  . Not on file   Social History Narrative    Past Medical History  Diagnosis Date  . Hypertension   . Mild obesity   . Atrial fibrillation (HCC)     chronic  . Other and unspecified hyperlipidemia   . Benign neoplasm of colon   . Other abnormal glucose   . Enthesopathy of hip region   . Osteoarthrosis, unspecified whether generalized or localized, pelvic region and thigh      Patient Active Problem List   Diagnosis Date Noted  . History of adenomatous polyp of colon 01/08/2016  . Hyperlipidemia 01/08/2016  . Enthesopathy of hip 01/08/2016  . Impotence of organic origin 01/08/2016  . Abnormal blood sugar 01/08/2016  . History of tobacco use 01/08/2016  . OA (osteoarthritis) of hip 01/08/2016  . OBESITY-MORBID (>100') 11/17/2008  . Essential hypertension 11/17/2008  . ATRIAL FIBRILLATION 11/17/2008    Past Surgical History  Procedure Laterality Date  . Appendectomy    . Cardiac catheterization  2007  . Colonoscopy  04/04/2014    Tubular Adenoma, Dr. Allen Norris; Repeat 3 years  . Thumb surgery  1980    had staph infection and had to cut out the area  . Hernia repair      1950's x 2    His family history includes Heart attack in his father;  Hypertension in his mother.    Previous Medications   ASPIRIN EC 81 MG TABLET    Take 1 tablet (81 mg total) by mouth daily.   CARVEDILOL (COREG) 25 MG TABLET    Take 1 tablet (25 mg total) by mouth 2 (two) times daily with a meal.   DIGOXIN (LANOXIN) 0.125 MG TABLET    Take 1 tablet (0.125 mg total) by mouth daily.   DOCUSATE (COLACE) 60 MG/15ML SYRUP    Take 60 mg by mouth daily.     FLUTICASONE (FLONASE) 50 MCG/ACT NASAL SPRAY    Place 2 sprays into both nostrils daily.   LORATADINE (CLARITIN) 10 MG TABLET    Take 10 mg by mouth daily.     LOVASTATIN (MEVACOR) 20 MG TABLET    TAKE 1 TABLET EVERY DAY USUALLY IN THE EVENING   MULTIPLE VITAMIN (MULTIVITAMIN) TABLET    Take 1 tablet by mouth daily.     NAPROXEN SODIUM (ALEVE PO)    Take by mouth as needed (for headaches, pain etc...).   TADALAFIL (CIALIS) 20 MG TABLET    Take 20 mg by mouth  daily as needed.     VALACYCLOVIR (VALTREX) 1000 MG TABLET    Take 1 tablet by mouth as needed.    Patient Care Team: Birdie Sons, MD as PCP - General (Family Medicine) Evans Lance, MD as Consulting Physician (Cardiology) Earnestine Leys, MD (Orthopedic Surgery)     Objective:   Vitals: BP 144/86 mmHg  Pulse 73  Temp(Src) 98 F (36.7 C)  Resp 16  Ht 6' 0.25" (1.835 m)  Wt 258 lb (117.028 kg)  BMI 34.76 kg/m2  SpO2 97%  Physical Exam   General Appearance:    Alert, cooperative, no distress, appears stated age  Head:    Normocephalic, without obvious abnormality, atraumatic  Eyes:    PERRL, conjunctiva/corneas clear, EOM's intact, fundi    benign, both eyes       Ears:    Normal TM's and external ear canals, both ears  Nose:   Nares normal, septum midline, mucosa normal, no drainage   or sinus tenderness  Throat:   Lips, mucosa, and tongue normal; teeth and gums normal  Neck:   Supple, symmetrical, trachea midline, no adenopathy;       thyroid:  No enlargement/tenderness/nodules; no carotid   bruit or JVD  Back:     Symmetric, no  curvature, ROM normal, no CVA tenderness  Lungs:     Clear to auscultation bilaterally, respirations unlabored  Chest wall:    No tenderness or deformity  Heart:    Irregularly irregular rhythm. Normal rate  S1 and S2 normal, no murmur, rub   or gallop  Abdomen:     Soft, non-tender, bowel sounds active all four quadrants,    no masses, no organomegaly  Genitalia:    deferred  Rectal:    deferred  Extremities:   Extremities normal, atraumatic, no cyanosis or edema  Pulses:   2+ and symmetric all extremities  Skin:   Skin color, texture, turgor normal, no rashes or lesions  Lymph nodes:   Cervical, supraclavicular, and axillary nodes normal  Neurologic:   CNII-XII intact. Normal strength, sensation and reflexes      throughout    Activities of Daily Living In your present state of health, do you have any difficulty performing the following activities: 01/10/2016  Hearing? N  Vision? N  Difficulty concentrating or making decisions? N  Walking or climbing stairs? N  Dressing or bathing? N  Doing errands, shopping? N    Fall Risk Assessment Fall Risk  01/10/2016  Falls in the past year? No     Depression Screen PHQ 2/9 Scores 01/10/2016  PHQ - 2 Score 0    Cognitive Testing - 6-CIT  Correct? Score   What year is it? yes 0 0 or 4  What month is it? yes 0 0 or 3  Memorize:    Pia Mau,  40,  [place],      What time is it? (within 1 hour) yes 0 0 or 3  Count backwards from 20 yes 0 0, 2, or 4  Name the months of the year yes 0 0, 2, or 4  Repeat name & address above no 3 0, 2, 4, 6, 8, or 10       TOTAL SCORE  3/28   Interpretation:  Normal  Normal (0-7) Abnormal (8-28)   Audit-C Alcohol Use Screening  Question Answer Points  How often do you have alcoholic drink? 2-3 times weekly 3  On days you do drink  alcohol, how many drinks do you typically consume? 1 or 2 0  How oftey will you drink 6 or more in a total? 1 times monthly or less 1  Total Score:  4    A score of 3 or more in women, and 4 or more in men indicates increased risk for alcohol abuse, EXCEPT if all of the points are from question 1.  Current Exercise Habits: Home exercise routine, Time (Minutes): 30, Frequency (Times/Week): 3, Weekly Exercise (Minutes/Week): 90, Intensity: Moderate Exercise limited by: None identified    Assessment & Plan:    Annual physical Reviewed patient's Family Medical History Reviewed and updated list of patient's medical providers Assessment of cognitive impairment was done Assessed patient's functional ability Established a written schedule for health screening [place] Completed and Reviewed  Exercise Activities and Dietary recommendations Goals    None      Immunization History  Administered Date(s) Administered  . Pneumococcal Conjugate-13 04/14/2014  . Td 09/01/2005  . Tdap 04/14/2014    Health Maintenance  Topic Date Due  . Hepatitis C Screening  1950/11/12  . ZOSTAVAX  11/19/2008  . PNA vac Low Risk Adult (2 of 2 - PPSV23) 04/15/2015  . INFLUENZA VACCINE  04/01/2016  . COLONOSCOPY  04/05/2023  . TETANUS/TDAP  04/14/2024      Discussed health benefits of physical activity, and encouraged him to engage in regular exercise appropriate for his age and condition.    ------------------------------------------------------------------------------------------------------------  .The entirety of the information documented in the History of Present Illness, Review of Systems and Physical Exam were personally obtained by me. Portions of this information were initially documented by Meyer Cory, CMA  and reviewed by me for thoroughness and accuracy.

## 2016-01-11 LAB — RENAL FUNCTION PANEL
ALBUMIN: 4.4 g/dL (ref 3.6–4.8)
BUN/Creatinine Ratio: 16 (ref 10–24)
BUN: 13 mg/dL (ref 8–27)
CHLORIDE: 97 mmol/L (ref 96–106)
CO2: 25 mmol/L (ref 18–29)
CREATININE: 0.81 mg/dL (ref 0.76–1.27)
Calcium: 9.4 mg/dL (ref 8.6–10.2)
GFR, EST AFRICAN AMERICAN: 106 mL/min/{1.73_m2} (ref >59)
GFR, EST NON AFRICAN AMERICAN: 92 mL/min/{1.73_m2} (ref >59)
Glucose: 116 mg/dL — ABNORMAL HIGH (ref 65–99)
Phosphorus: 3.7 mg/dL (ref 2.5–4.5)
Potassium: 5.1 mmol/L (ref 3.5–5.2)
Sodium: 138 mmol/L (ref 134–144)

## 2016-01-11 LAB — HEPATIC FUNCTION PANEL
ALK PHOS: 63 IU/L (ref 39–117)
ALT: 30 IU/L (ref 0–44)
AST: 22 IU/L (ref 0–40)
BILIRUBIN TOTAL: 0.7 mg/dL (ref 0.0–1.2)
BILIRUBIN, DIRECT: 0.18 mg/dL (ref 0.00–0.40)
Total Protein: 7.1 g/dL (ref 6.0–8.5)

## 2016-01-11 LAB — LIPID PANEL
CHOLESTEROL TOTAL: 153 mg/dL (ref 100–199)
Chol/HDL Ratio: 3.1 ratio units (ref 0.0–5.0)
HDL: 50 mg/dL (ref >39)
LDL Calculated: 67 mg/dL (ref 0–99)
TRIGLYCERIDES: 178 mg/dL — AB (ref 0–149)
VLDL CHOLESTEROL CAL: 36 mg/dL (ref 5–40)

## 2016-01-11 LAB — HEPATITIS C ANTIBODY: Hep C Virus Ab: <0.1 s/co ratio (ref 0.0–0.9)

## 2016-01-21 ENCOUNTER — Other Ambulatory Visit: Payer: Self-pay | Admitting: Family Medicine

## 2016-01-22 ENCOUNTER — Other Ambulatory Visit: Payer: Self-pay | Admitting: Family Medicine

## 2016-01-31 ENCOUNTER — Encounter: Payer: Self-pay | Admitting: Family Medicine

## 2016-03-19 DIAGNOSIS — L820 Inflamed seborrheic keratosis: Secondary | ICD-10-CM | POA: Diagnosis not present

## 2016-03-19 DIAGNOSIS — C44519 Basal cell carcinoma of skin of other part of trunk: Secondary | ICD-10-CM | POA: Diagnosis not present

## 2016-03-19 DIAGNOSIS — C44712 Basal cell carcinoma of skin of right lower limb, including hip: Secondary | ICD-10-CM | POA: Diagnosis not present

## 2016-04-21 ENCOUNTER — Other Ambulatory Visit: Payer: Self-pay | Admitting: Family Medicine

## 2016-05-07 DIAGNOSIS — C44712 Basal cell carcinoma of skin of right lower limb, including hip: Secondary | ICD-10-CM | POA: Diagnosis not present

## 2016-05-07 DIAGNOSIS — L905 Scar conditions and fibrosis of skin: Secondary | ICD-10-CM | POA: Diagnosis not present

## 2016-07-14 ENCOUNTER — Ambulatory Visit (INDEPENDENT_AMBULATORY_CARE_PROVIDER_SITE_OTHER): Payer: PPO | Admitting: Family Medicine

## 2016-07-14 ENCOUNTER — Encounter: Payer: Self-pay | Admitting: Family Medicine

## 2016-07-14 VITALS — BP 150/90 | HR 75 | Temp 98.5°F | Resp 16 | Ht 72.25 in | Wt 255.0 lb

## 2016-07-14 DIAGNOSIS — R739 Hyperglycemia, unspecified: Secondary | ICD-10-CM

## 2016-07-14 DIAGNOSIS — I10 Essential (primary) hypertension: Secondary | ICD-10-CM | POA: Diagnosis not present

## 2016-07-14 LAB — POCT GLYCOSYLATED HEMOGLOBIN (HGB A1C)
Est. average glucose Bld gHb Est-mCnc: 114
Hemoglobin A1C: 5.6

## 2016-07-14 MED ORDER — HYDROCHLOROTHIAZIDE 25 MG PO TABS: 25.0000 mg | ORAL_TABLET | Freq: Every day | ORAL | 3 refills | Status: DC

## 2016-07-14 NOTE — Progress Notes (Signed)
Patient: Steven Saunders Male    DOB: March 09, 1951   65 y.o.   MRN: [id number] Visit Date: 07/14/2016  Today's Provider: Lelon Huh, MD   Chief Complaint  Patient presents with  . Follow-up  . Hypertension   Subjective:    HPI  Elevated Blood Sugar From 01/10/2016-checked labs. Blood sugar was mildly elevated at 116. Advised to avoid sweats and starchy foods.  Wt Readings from Last 3 Encounters:  07/14/16 255 lb (115.7 kg)  01/10/16 258 lb (117 kg)  11/01/15 254 lb 9.6 oz (115.5 kg)       Hypertension, follow-up:  BP Readings from Last 3 Encounters:  07/14/16 (!) 150/90  01/10/16 (!) 144/86  11/01/15 (!) 150/92    He was last seen for hypertension 6 months ago.  BP at that visit was 144/86. Management since that visit includes; no changes.He reports good compliance with treatment. He is not having side effects. none He is exercising. He is adherent to low salt diet.   Outside blood pressures are 150/100. He is experiencing none.  Patient denies none.   Cardiovascular risk factors include none.  Use of agents associated with hypertension: none.   ----------------------------------------------------------------  He continues to follow up with Dr. Lovena Le (cardiology) for a-fib. He has follow up in March.    No Known Allergies   Current Outpatient Prescriptions:  .  aspirin EC 81 MG tablet, Take 1 tablet (81 mg total) by mouth daily., Disp: 90 tablet, Rfl: 3 .  carvedilol (COREG) 25 MG tablet, Take 1 tablet (25 mg total) by mouth 2 (two) times daily with a meal., Disp: 180 tablet, Rfl: 3 .  digoxin (LANOXIN) 0.125 MG tablet, Take 1 tablet (0.125 mg total) by mouth daily., Disp: 90 tablet, Rfl: 3 .  docusate (COLACE) 60 MG/15ML syrup, Take 60 mg by mouth daily.  , Disp: , Rfl:  .  fluticasone (FLONASE) 50 MCG/ACT nasal spray, Place 2 sprays into both nostrils daily., Disp: 16 g, Rfl: 6 .  loratadine (CLARITIN) 10 MG tablet, Take 10 mg by mouth daily.   , Disp: , Rfl:  .  lovastatin (MEVACOR) 20 MG tablet, TAKE 1 TABLET EVERY DAY USUALLY IN THE EVENING, Disp: 30 tablet, Rfl: 11 .  Multiple Vitamin (MULTIVITAMIN) tablet, Take 1 tablet by mouth daily.  , Disp: , Rfl:  .  Naproxen Sodium (ALEVE PO), Take by mouth as needed (for headaches, pain etc...)., Disp: , Rfl:  .  tadalafil (CIALIS) 20 MG tablet, Take 20 mg by mouth daily as needed.  , Disp: , Rfl:  .  valACYclovir (VALTREX) 1000 MG tablet, TAKE TWO TABLETS EVERY 12 HOURS IF NEEDED, Disp: 20 tablet, Rfl: 12  Review of Systems  Constitutional: Negative for appetite change, chills and fever.  Respiratory: Negative for chest tightness, shortness of breath and wheezing.   Cardiovascular: Negative for chest pain and palpitations.  Gastrointestinal: Negative for abdominal pain, nausea and vomiting.    Social History  Substance Use Topics  . Smoking status: Former Smoker    Packs/day: 2.00    Years: 15.00    Types: Cigarettes    Quit date: 11/12/2009  . Smokeless tobacco: Never Used  . Alcohol use 1.8 oz/week    3 Cans of beer per week     Comment: daily   Objective:   BP (!) 150/90 (BP Location: Right Arm, Patient Position: Sitting, Cuff Size: Large)   Pulse 75   Temp 98.5 F (36.9  C) (Oral)   Resp 16   Ht 6' 0.25" (1.835 m)   Wt 255 lb (115.7 kg)   SpO2 100%   BMI 34.35 kg/m   Physical Exam   General Appearance:    Alert, cooperative, no distress  Eyes:    PERRL, conjunctiva/corneas clear, EOM's intact       Lungs:     Clear to auscultation bilaterally, respirations unlabored  Heart:     Irregularly irregular rhythm. Normal rate   Neurologic:   Awake, alert, oriented x 3. No apparent focal neurological           defect.       Results for orders placed or performed in visit on 07/14/16  POCT glycosylated hemoglobin (Hb A1C)  Result Value Ref Range   Hemoglobin A1C 5.6    Est. average glucose Bld gHb Est-mCnc 114        Assessment & Plan:     1. Essential  hypertension Uncontrolled. Previously on hctz. Will restart in addition to [place]. Return in 2-3 months and check electrolytes at follow up.  - hydrochlorothiazide (HYDRODIURIL) 25 MG tablet; Take 1 tablet (25 mg total) by mouth daily.  Dispense: 30 tablet; Refill: 3  2. Elevated blood sugar level Stable. Continue to avoid sweets and white starches.  - POCT glycosylated hemoglobin (Hb A1C)       Lelon Huh, MD  [facility]

## 2016-09-10 DIAGNOSIS — H35363 Drusen (degenerative) of macula, bilateral: Secondary | ICD-10-CM | POA: Diagnosis not present

## 2016-09-23 ENCOUNTER — Ambulatory Visit (INDEPENDENT_AMBULATORY_CARE_PROVIDER_SITE_OTHER): Payer: PPO | Admitting: Family Medicine

## 2016-09-23 ENCOUNTER — Encounter: Payer: Self-pay | Admitting: Family Medicine

## 2016-09-23 VITALS — BP 144/96 | HR 75 | Temp 98.2°F | Resp 16 | Ht 72.0 in | Wt 259.0 lb

## 2016-09-23 DIAGNOSIS — I482 Chronic atrial fibrillation, unspecified: Secondary | ICD-10-CM

## 2016-09-23 DIAGNOSIS — I10 Essential (primary) hypertension: Secondary | ICD-10-CM | POA: Diagnosis not present

## 2016-09-23 NOTE — Progress Notes (Signed)
Patient: Steven Saunders Male    DOB: Feb 26, 1951   66 y.o.   MRN: [id number] Visit Date: 09/23/2016  Today's Provider: Lelon Huh, MD   Chief Complaint  Patient presents with  . Follow-up  . Hypertension   Subjective:    HPI     Hypertension, follow-up:  BP Readings from Last 3 Encounters:  09/23/16 (!) 160/90  07/14/16 (!) 150/90  01/10/16 (!) 144/86    He was last seen for hypertension 2 months ago.  BP at that visit was 150/90. Management since that visit includes restarting hctz . Return in 2-3 months and check electrolytes at follow up.He reports good compliance with treatment. He is not having side effects. none He is not exercising. He is adherent to low salt diet.   Outside blood pressures are up and down.States at pharmacy was in the 160s.  He is experiencing none.  Patient denies none.   Cardiovascular risk factors include none.  Use of agents associated with hypertension: none.   ----------------------------------------------------------------   No Known Allergies   Current Outpatient Prescriptions:  .  aspirin EC 81 MG tablet, Take 1 tablet (81 mg total) by mouth daily., Disp: 90 tablet, Rfl: 3 .  carvedilol (COREG) 25 MG tablet, Take 1 tablet (25 mg total) by mouth 2 (two) times daily with a meal., Disp: 180 tablet, Rfl: 3 .  digoxin (LANOXIN) 0.125 MG tablet, Take 1 tablet (0.125 mg total) by mouth daily., Disp: 90 tablet, Rfl: 3 .  docusate (COLACE) 60 MG/15ML syrup, Take 60 mg by mouth daily.  , Disp: , Rfl:  .  fluticasone (FLONASE) 50 MCG/ACT nasal spray, Place 2 sprays into both nostrils daily., Disp: 16 g, Rfl: 6 .  hydrochlorothiazide (HYDRODIURIL) 25 MG tablet, Take 1 tablet (25 mg total) by mouth daily., Disp: 30 tablet, Rfl: 3 .  loratadine (CLARITIN) 10 MG tablet, Take 10 mg by mouth daily.  , Disp: , Rfl:  .  lovastatin (MEVACOR) 20 MG tablet, TAKE 1 TABLET EVERY DAY USUALLY IN THE EVENING, Disp: 30 tablet, Rfl: 11 .   Multiple Vitamin (MULTIVITAMIN) tablet, Take 1 tablet by mouth daily.  , Disp: , Rfl:  .  Naproxen Sodium (ALEVE PO), Take by mouth as needed (for headaches, pain etc...)., Disp: , Rfl:  .  tadalafil (CIALIS) 20 MG tablet, Take 20 mg by mouth daily as needed.  , Disp: , Rfl:  .  valACYclovir (VALTREX) 1000 MG tablet, TAKE TWO TABLETS EVERY 12 HOURS IF NEEDED, Disp: 20 tablet, Rfl: 12  Review of Systems  Constitutional: Negative for appetite change, chills and fever.  Respiratory: Negative for chest tightness, shortness of breath and wheezing.   Cardiovascular: Negative for chest pain and palpitations.  Gastrointestinal: Negative for abdominal pain, nausea and vomiting.    Social History  Substance Use Topics  . Smoking status: Former Smoker    Packs/day: 2.00    Years: 15.00    Types: Cigarettes    Quit date: 11/12/2009  . Smokeless tobacco: Never Used  . Alcohol use 1.8 oz/week    3 Cans of beer per week     Comment: daily   Objective:    Vitals:   09/23/16 0947 09/23/16 1011  BP: (!) 160/90 (!) 144/96  Pulse: 75   Resp: 16   Temp: 98.2 F (36.8 C)   TempSrc: Oral   SpO2: 99%   Weight: 259 lb (117.5 kg)   Height: 6' (1.829 m)  Physical Exam   General Appearance:    Alert, cooperative, no distress  Eyes:    PERRL, conjunctiva/corneas clear, EOM's intact       Lungs:     Clear to auscultation bilaterally, respirations unlabored  Heart:    Regular rate and rhythm  Neurologic:   Awake, alert, oriented x 3. No apparent focal neurological           defect.           Assessment & Plan:     1. Chronic atrial fibrillation (HCC)  - Digoxin level  2. Essential hypertension Improved, but not controlled. If labs are stable will consider increasing hctz or adding ARB - Renal function panel  He is scheduled to follow up with his cardiologist in March      Lelon Huh, MD  [facility]

## 2016-09-24 LAB — RENAL FUNCTION PANEL
ALBUMIN: 4.7 g/dL (ref 3.6–4.8)
BUN/Creatinine Ratio: 14 (ref 10–24)
BUN: 12 mg/dL (ref 8–27)
CO2: 25 mmol/L (ref 18–29)
Calcium: 9.4 mg/dL (ref 8.6–10.2)
Chloride: 94 mmol/L — ABNORMAL LOW (ref 96–106)
Creatinine, Ser: 0.85 mg/dL (ref 0.76–1.27)
GFR calc Af Amer: 104 mL/min/{1.73_m2} (ref >59)
GFR, EST NON AFRICAN AMERICAN: 90 mL/min/{1.73_m2} (ref >59)
GLUCOSE: 117 mg/dL — AB (ref 65–99)
PHOSPHORUS: 3.6 mg/dL (ref 2.5–4.5)
POTASSIUM: 4.9 mmol/L (ref 3.5–5.2)
Sodium: 135 mmol/L (ref 134–144)

## 2016-09-24 LAB — DIGOXIN LEVEL: Digoxin, Serum: 0.5 ng/mL (ref 0.5–0.9)

## 2016-09-26 ENCOUNTER — Telehealth: Payer: Self-pay

## 2016-09-26 ENCOUNTER — Other Ambulatory Visit: Payer: Self-pay

## 2016-09-26 MED ORDER — LOSARTAN POTASSIUM-HCTZ 100-25 MG PO TABS: 1.0000 | ORAL_TABLET | Freq: Every day | ORAL | 2 refills | Status: DC

## 2016-09-26 NOTE — Telephone Encounter (Signed)
-----   Message from Birdie Sons, MD sent at 09/24/2016  8:17 AM EST ----- Kidney functions are good. Need to change hctz to losartan-hctz 100-25mg , #30, rf x 2. Check renal panel 4 weeks to make sure this works well his kidneys and electrolytes.

## 2016-10-06 ENCOUNTER — Ambulatory Visit (INDEPENDENT_AMBULATORY_CARE_PROVIDER_SITE_OTHER): Payer: PPO | Admitting: Family Medicine

## 2016-10-06 ENCOUNTER — Encounter: Payer: Self-pay | Admitting: Family Medicine

## 2016-10-06 VITALS — BP 142/92 | HR 70 | Temp 98.4°F | Resp 16 | Wt 263.4 lb

## 2016-10-06 DIAGNOSIS — J069 Acute upper respiratory infection, unspecified: Secondary | ICD-10-CM | POA: Diagnosis not present

## 2016-10-06 DIAGNOSIS — B9789 Other viral agents as the cause of diseases classified elsewhere: Secondary | ICD-10-CM

## 2016-10-06 MED ORDER — HYDROCODONE-HOMATROPINE 5-1.5 MG/5ML PO SYRP: ORAL_SOLUTION | ORAL | 0 refills | Status: DC

## 2016-10-06 NOTE — Patient Instructions (Signed)
Continue Coricidin for High BP at full dose. Let me know if your sinuses are not improving by the end of the week.

## 2016-10-06 NOTE — Progress Notes (Signed)
Subjective:     Patient ID: Steven Saunders, male   DOB: 06-Jun-1951, 66 y.o.   MRN: [id number]  HPI  Chief Complaint  Patient presents with  . Cough    Patient comes into office today with concerns of cough and congestion since 10/01/16. Patient states at night when laying down he wheezes. Patient has ben taking otc Coricidan HbP and generic cold/flu and chest and cough syrup. Patient reports that he had a old prescription for amoxicillin and he has already taken two days worth.   Reports sinus congestion, PND and cough.   Review of Systems     Objective:   Physical Exam  Constitutional: He appears well-developed and well-nourished. No distress.  Ears: T.M's intact without inflammation Throat: no tonsillar enlargement or exudate Neck: no cervical adenopathy Lungs: clear     Assessment:    1. Viral upper respiratory tract infection - HYDROcodone-homatropine (HYCODAN) 5-1.5 MG/5ML syrup; 5 ml 4-6 hours as needed for cough  Dispense: 240 mL; Refill: 0    Plan:    Continue Coricidin for High BP. Phone f/u if sinuses not improving over the next few days.

## 2016-10-13 ENCOUNTER — Encounter: Payer: Self-pay | Admitting: Family Medicine

## 2016-10-13 ENCOUNTER — Ambulatory Visit (INDEPENDENT_AMBULATORY_CARE_PROVIDER_SITE_OTHER): Payer: PPO | Admitting: Family Medicine

## 2016-10-13 ENCOUNTER — Ambulatory Visit
Admission: RE | Admit: 2016-10-13 | Discharge: 2016-10-13 | Disposition: A | Discharge status: Home / Self Care | Payer: PPO | Source: Ambulatory Visit | Attending: Family Medicine | Admitting: Family Medicine

## 2016-10-13 VITALS — HR 71 | Temp 98.0°F | Resp 16 | Wt 264.0 lb

## 2016-10-13 DIAGNOSIS — R05 Cough: Secondary | ICD-10-CM

## 2016-10-13 DIAGNOSIS — I959 Hypotension, unspecified: Secondary | ICD-10-CM | POA: Diagnosis not present

## 2016-10-13 DIAGNOSIS — J189 Pneumonia, unspecified organism: Secondary | ICD-10-CM | POA: Diagnosis not present

## 2016-10-13 DIAGNOSIS — I700 Atherosclerosis of aorta: Secondary | ICD-10-CM | POA: Insufficient documentation

## 2016-10-13 DIAGNOSIS — I517 Cardiomegaly: Secondary | ICD-10-CM | POA: Insufficient documentation

## 2016-10-13 DIAGNOSIS — J181 Lobar pneumonia, unspecified organism: Secondary | ICD-10-CM | POA: Diagnosis not present

## 2016-10-13 DIAGNOSIS — R059 Cough, unspecified: Secondary | ICD-10-CM

## 2016-10-13 LAB — POC INFLUENZA A&B (BINAX/QUICKVUE)
Influenza A, POC: NEGATIVE
Influenza B, POC: NEGATIVE

## 2016-10-13 MED ORDER — CEFTRIAXONE SODIUM 1 G IJ SOLR
1.0000 g | Freq: Once | INTRAMUSCULAR | Status: AC
  Administered 2016-10-13: 1 g via INTRAMUSCULAR

## 2016-10-13 MED ORDER — LEVOFLOXACIN 750 MG PO TABS: 750.0000 mg | ORAL_TABLET | Freq: Every day | ORAL | 0 refills | Status: DC

## 2016-10-13 NOTE — Patient Instructions (Addendum)
   Do not take hydrochlorothiazide until you are feeling better.    Go to the [facility] on [address] for chest  Xray

## 2016-10-13 NOTE — Progress Notes (Signed)
Patient: Steven Saunders Male    DOB: [birth year]   66 y.o.   MRN: [id number] Visit Date: 10/13/2016  Today's Provider: Lelon Huh, MD   Chief Complaint  Patient presents with  . Cough   Subjective:    Patient has had cough for 7 days. Patient saw Mariel Sleet 10/08/2016 for upper respiratory symptoms and was given hydrocodone cough syrup. Patient stated that symptoms improved for a few days ago, but worsened yesterday and started having fever of 102.  He is having pain in his back when he breaths in. Patient also states that the mucous he is coughing up is a dark brown color. Patient is having sob and wheezing. Took coricidin and cough syrump this morning.    Cough  This is a new problem. The current episode started in the past 7 days. The problem has been unchanged. The problem occurs every few minutes. The cough is productive of brown sputum. Associated symptoms include chest pain, chills, ear congestion, myalgias, nasal congestion, postnasal drip, rhinorrhea, shortness of breath, sweats and wheezing. Pertinent negatives include no ear pain, fever, headaches, heartburn, hemoptysis, rash, sore throat or weight loss. The symptoms are aggravated by lying down. Treatments tried: hydrocodone syrup. The treatment provided moderate relief. His past medical history is significant for pneumonia.      No Known Allergies   Current Outpatient Prescriptions:  .  aspirin EC 81 MG tablet, Take 1 tablet (81 mg total) by mouth daily., Disp: 90 tablet, Rfl: 3 .  carvedilol (COREG) 25 MG tablet, Take 1 tablet (25 mg total) by mouth 2 (two) times daily with a meal., Disp: 180 tablet, Rfl: 3 .  digoxin (LANOXIN) 0.125 MG tablet, Take 1 tablet (0.125 mg total) by mouth daily., Disp: 90 tablet, Rfl: 3 .  docusate (COLACE) 60 MG/15ML syrup, Take 60 mg by mouth daily.  , Disp: , Rfl:  .  fluticasone (FLONASE) 50 MCG/ACT nasal spray, Place 2 sprays into both nostrils daily., Disp: 16 g, Rfl: 6 .   hydrochlorothiazide (HYDRODIURIL) 25 MG tablet, , Disp: , Rfl:  .  HYDROcodone-homatropine (HYCODAN) 5-1.5 MG/5ML syrup, 5 ml 4-6 hours as needed for cough, Disp: 240 mL, Rfl: 0 .  loratadine (CLARITIN) 10 MG tablet, Take 10 mg by mouth daily.  , Disp: , Rfl:  .  lovastatin (MEVACOR) 20 MG tablet, TAKE 1 TABLET EVERY DAY USUALLY IN THE EVENING, Disp: 30 tablet, Rfl: 11 .  Multiple Vitamin (MULTIVITAMIN) tablet, Take 1 tablet by mouth daily.  , Disp: , Rfl:  .  Naproxen Sodium (ALEVE PO), Take by mouth as needed (for headaches, pain etc...)., Disp: , Rfl:  .  tadalafil (CIALIS) 20 MG tablet, Take 20 mg by mouth daily as needed.  , Disp: , Rfl:  .  valACYclovir (VALTREX) 1000 MG tablet, TAKE TWO TABLETS EVERY 12 HOURS IF NEEDED, Disp: 20 tablet, Rfl: 12  Review of Systems  Constitutional: Positive for chills. Negative for appetite change, fever and weight loss.  HENT: Positive for postnasal drip and rhinorrhea. Negative for ear pain and sore throat.   Respiratory: Positive for cough, shortness of breath and wheezing. Negative for hemoptysis and chest tightness.   Cardiovascular: Positive for chest pain. Negative for palpitations.  Gastrointestinal: Negative for abdominal pain, heartburn, nausea and vomiting.  Musculoskeletal: Positive for myalgias.  Skin: Negative for rash.  Neurological: Negative for headaches.    Social History  Substance Use Topics  . Smoking status: Former Smoker  Packs/day: 2.00    Years: 15.00    Types: Cigarettes    Quit date: 11/12/2009  . Smokeless tobacco: Never Used  . Alcohol use 1.8 oz/week    3 Cans of beer per week     Comment: daily   Objective:   Pulse 71   Temp 98 F (36.7 C) (Oral)   Resp 16   Wt 264 lb (119.7 kg)   SpO2 94%   BMI 35.80 kg/m   Physical Exam  General Appearance:    Alert, cooperative, no distress  HENT:   ENT exam normal, no neck nodes or sinus tenderness  Eyes:    PERRL, conjunctiva/corneas clear, EOM's intact         Lungs:     Occasional faint rales left lower lung fields. , respirations unlabored  Heart:    Regular rate and rhythm  Neurologic:   Awake, alert, oriented x 3. No apparent focal neurological           defect.      Dg Chest 2 View  Result Date: 10/13/2016 CLINICAL DATA:  Off, chest congestion, and fever to 102 degrees for the past 5 days. Negative flu test. History of atrial fibrillation, former smoker, morbid obesity. EXAM: CHEST  2 VIEW COMPARISON:  Portable chest x-ray dated September 18, 2005. FINDINGS: There is dense infiltrate in the left lower lobe. There may be a small left pleural effusion. The right lung is clear. The heart is top-normal in size. The pulmonary vascularity is normal. There is calcification in the wall of the aortic arch. The bony thorax exhibits no acute abnormality. IMPRESSION: Left lower lobe pneumonia. Followup PA and lateral chest X-ray is recommended in 3-4 weeks following trial of antibiotic therapy to ensure resolution and exclude underlying malignancy. Mild cardiomegaly without CHF. Thoracic aortic atherosclerosis. Electronically Signed   By: David  Martinique M.D.   On: 10/13/2016 13:34      Results for orders placed or performed in visit on 10/13/16  POC Influenza A&B(BINAX/QUICKVUE)  Result Value Ref Range   Influenza A, POC Negative Negative   Influenza B, POC Negative Negative       Assessment & Plan:     1. Hypotension, unspecified hypotension type Secondary to acute infection. Advised to stop hctz for the time being. Increase fluid intake  2. Cough  - cefTRIAXone (ROCEPHIN) injection 1 g; Inject 1 g into the muscle once. - POC Influenza A&B(BINAX/QUICKVUE) - DG Chest 2 View; Future  3. Pneumonia of left lower lobe due to infectious organism ([place])  - levofloxacin (LEVAQUIN) 750 MG tablet; Take 1 tablet (750 mg total) by mouth daily.  Dispense: 7 tablet; Refill: 0       Lelon Huh, MD  [facility]

## 2016-10-17 ENCOUNTER — Telehealth: Payer: Self-pay | Admitting: Family Medicine

## 2016-10-17 NOTE — Telephone Encounter (Signed)
It usually takes a few week to feel back to normal, but need to follow up Monday or Tuesday to check Blood pressure and to see if he needs anymore antibiotic.  We are open in the morning if he starts feeling any worse.

## 2016-10-17 NOTE — Telephone Encounter (Signed)
Called pt back to see if his symptoms have worsened since his ov 10/13/16. LMOVM to cb.

## 2016-10-17 NOTE — Telephone Encounter (Signed)
Pt was in Monday with cough and pneumonia. Neg for flu.  Pt is still on antibiotic.  Still coughing and feeling bad.,  Pt wants to know how long does it take to get rid of all this coughand feeling bad,  His call back is [phone]  [facility]

## 2016-10-17 NOTE — Telephone Encounter (Signed)
Patient was advised and wanted to schedule appt for Monday since he is not feeling better. KW

## 2016-10-17 NOTE — Telephone Encounter (Signed)
Spoke with patient on the phone he states that his symptoms are improving, he states that his cough is still present and is no productive of brown phlegm. Patient complains of pain in her back on the left side and states that he has decreased hearing in the left ear.I asked patient if he was still taking Hycodan at night for cough and he says that he has and it has been helping.  KW

## 2016-10-20 ENCOUNTER — Encounter: Payer: Self-pay | Admitting: Family Medicine

## 2016-10-20 ENCOUNTER — Ambulatory Visit (INDEPENDENT_AMBULATORY_CARE_PROVIDER_SITE_OTHER): Payer: PPO | Admitting: Family Medicine

## 2016-10-20 VITALS — BP 108/72 | HR 72 | Temp 97.9°F | Resp 18 | Wt 255.0 lb

## 2016-10-20 DIAGNOSIS — J069 Acute upper respiratory infection, unspecified: Secondary | ICD-10-CM

## 2016-10-20 DIAGNOSIS — J181 Lobar pneumonia, unspecified organism: Secondary | ICD-10-CM | POA: Diagnosis not present

## 2016-10-20 DIAGNOSIS — R059 Cough, unspecified: Secondary | ICD-10-CM

## 2016-10-20 DIAGNOSIS — H60502 Unspecified acute noninfective otitis externa, left ear: Secondary | ICD-10-CM

## 2016-10-20 DIAGNOSIS — R05 Cough: Secondary | ICD-10-CM

## 2016-10-20 DIAGNOSIS — B9789 Other viral agents as the cause of diseases classified elsewhere: Secondary | ICD-10-CM | POA: Diagnosis not present

## 2016-10-20 DIAGNOSIS — J189 Pneumonia, unspecified organism: Secondary | ICD-10-CM

## 2016-10-20 MED ORDER — LEVOFLOXACIN 750 MG PO TABS: 750.0000 mg | ORAL_TABLET | Freq: Every day | ORAL | 0 refills | Status: AC

## 2016-10-20 MED ORDER — NEOMYCIN-POLYMYXIN-HC 1 % OT SOLN
3.0000 [drp] | OTIC | 0 refills | Status: AC
Start: 2016-10-20 — End: 2016-11-19

## 2016-10-20 MED ORDER — HYDROCODONE-HOMATROPINE 5-1.5 MG/5ML PO SYRP: ORAL_SOLUTION | ORAL | 0 refills | Status: AC

## 2016-10-20 NOTE — Progress Notes (Signed)
Patient: Steven Saunders Male    DOB: Aug 11, 1951   66 y.o.   MRN: [id number] Visit Date: 10/20/2016  Today's Provider: Lelon Huh, MD   Chief Complaint  Patient presents with  . Blood Pressure Check    follow up  . Pneumonia    follow up   Subjective:    HPI  Blood pressure check:  BP Readings from Last 3 Encounters:  10/06/16 (!) 142/92  09/23/16 (!) 144/96  07/14/16 (!) 150/90    He was last seen for hypotension 1 week ago when he was seen for pneumonia. BP at that visit was 90/48.  Management since that visit includes advising patient to stop HCTZ for the time being and increase fluid intake. He did start back on hctz this morning.  He reports good compliance with treatment. He is not having side effects.  He is not exercising. He is adherent to low salt diet.   Outside blood pressures this morning 120/76. He is experiencing chest pain, dyspnea and fatigue.  Patient denies chest pressure/discomfort, claudication, irregular heart beat, lower extremity edema, near-syncope, palpitations, paroxysmal nocturnal dyspnea, syncope and tachypnea.   Cardiovascular risk factors include advanced age (older than 55 for men, 13 for women), hypertension and male gender.  Use of agents associated with hypertension: NSAIDS.     Weight trend: decreasing steadily Wt Readings from Last 3 Encounters:  10/13/16 264 lb (119.7 kg)  10/06/16 263 lb 6.4 oz (119.5 kg)  09/23/16 259 lb (117.5 kg)      ------------------------------------------------------------------------ Follow up Pneumonia: Patient was last seen for this problem on 10/19/2016. Patient was treated with Levaquin. Patient called back on 10/17/2016 reporting that he now had a productive cough with brown phelgm, decreased hearing in his left ear and back pain on his left side. Patient comes in today reporting that he feels a little today, but still has a productive cough with rust colored phlegm. Has had no fever  since last week.     No Known Allergies   Current Outpatient Prescriptions:  .  aspirin EC 81 MG tablet, Take 1 tablet (81 mg total) by mouth daily., Disp: 90 tablet, Rfl: 3 .  carvedilol (COREG) 25 MG tablet, Take 1 tablet (25 mg total) by mouth 2 (two) times daily with a meal., Disp: 180 tablet, Rfl: 3 .  digoxin (LANOXIN) 0.125 MG tablet, Take 1 tablet (0.125 mg total) by mouth daily., Disp: 90 tablet, Rfl: 3 .  docusate (COLACE) 60 MG/15ML syrup, Take 60 mg by mouth daily.  , Disp: , Rfl:  .  fluticasone (FLONASE) 50 MCG/ACT nasal spray, Place 2 sprays into both nostrils daily., Disp: 16 g, Rfl: 6 .  hydrochlorothiazide (HYDRODIURIL) 25 MG tablet, , Disp: , Rfl:  .  loratadine (CLARITIN) 10 MG tablet, Take 10 mg by mouth daily.  , Disp: , Rfl:  .  lovastatin (MEVACOR) 20 MG tablet, TAKE 1 TABLET EVERY DAY USUALLY IN THE EVENING, Disp: 30 tablet, Rfl: 11 .  Multiple Vitamin (MULTIVITAMIN) tablet, Take 1 tablet by mouth daily.  , Disp: , Rfl:  .  Naproxen Sodium (ALEVE PO), Take by mouth as needed (for headaches, pain etc...)., Disp: , Rfl:  .  tadalafil (CIALIS) 20 MG tablet, Take 20 mg by mouth daily as needed.  , Disp: , Rfl:  .  valACYclovir (VALTREX) 1000 MG tablet, TAKE TWO TABLETS EVERY 12 HOURS IF NEEDED, Disp: 20 tablet, Rfl: 12 .  HYDROcodone-homatropine (HYCODAN) 5-1.5  MG/5ML syrup, 5 ml 4-6 hours as needed for cough (Patient not taking: Reported on 10/20/2016), Disp: 240 mL, Rfl: 0  Review of Systems  Constitutional: Positive for fatigue. Negative for appetite change, chills and fever.  HENT:       Decreased hearing in left ear  Respiratory: Positive for cough, chest tightness, shortness of breath and wheezing.   Cardiovascular: Positive for chest pain. Negative for palpitations.  Gastrointestinal: Negative for abdominal pain, nausea and vomiting.    Social History  Substance Use Topics  . Smoking status: Former Smoker    Packs/day: 2.00    Years: 15.00    Types:  Cigarettes    Quit date: 11/12/2009  . Smokeless tobacco: Never Used  . Alcohol use 1.8 oz/week    3 Cans of beer per week     Comment: daily   Objective:   BP 108/72 (BP Location: Right Arm, Patient Position: Sitting, Cuff Size: Large)   Pulse 72   Temp 97.9 F (36.6 C) (Oral)   Resp 18   Wt 255 lb (115.7 kg)   SpO2 99% Comment: room air  BMI 34.58 kg/m   Physical Exam   General Appearance:    Alert, cooperative, no distress  Eyes:    PERRL, conjunctiva/corneas clear, EOM's intact    Right ear canal inflamed. No discharge.   Lungs:     LLL rales, respirations unlabored  Heart:    Regular rate and rhythm  Neurologic:   Awake, alert, oriented x 3. No apparent focal neurological           defect.           Assessment & Plan:     1. Acute otitis externa of left ear, unspecified type Cortisporin otic sln  2. Pneumonia of left lower lobe due to infectious organism [facility]) Improving but not clinically resolve.d continue levofloxacin. Follow up 1 week and repeat CXR at follow up . Call if symptoms change or if not rapidly improving.    - levofloxacin (LEVAQUIN) 750 MG tablet; Take 1 tablet (750 mg total) by mouth daily.  Dispense: 7 tablet; Refill: 0  3. Cough  - HYDROcodone-homatropine (HYCODAN) 5-1.5 MG/5ML syrup; 5 ml 4-6 hours as needed for cough  Dispense: 120 mL; Refill: 0       Lelon Huh, MD  [facility]

## 2016-10-28 ENCOUNTER — Ambulatory Visit
Admission: RE | Admit: 2016-10-28 | Discharge: 2016-10-28 | Disposition: A | Discharge status: Home / Self Care | Payer: PPO | Source: Ambulatory Visit | Attending: Family Medicine | Admitting: Family Medicine

## 2016-10-28 ENCOUNTER — Encounter: Payer: Self-pay | Admitting: Family Medicine

## 2016-10-28 ENCOUNTER — Telehealth: Payer: Self-pay

## 2016-10-28 ENCOUNTER — Ambulatory Visit (INDEPENDENT_AMBULATORY_CARE_PROVIDER_SITE_OTHER): Payer: PPO | Admitting: Family Medicine

## 2016-10-28 VITALS — BP 110/72 | HR 57 | Temp 97.9°F | Resp 16 | Wt 245.0 lb

## 2016-10-28 DIAGNOSIS — J181 Lobar pneumonia, unspecified organism: Secondary | ICD-10-CM | POA: Diagnosis not present

## 2016-10-28 DIAGNOSIS — H9202 Otalgia, left ear: Secondary | ICD-10-CM | POA: Diagnosis not present

## 2016-10-28 DIAGNOSIS — J189 Pneumonia, unspecified organism: Secondary | ICD-10-CM

## 2016-10-28 DIAGNOSIS — I700 Atherosclerosis of aorta: Secondary | ICD-10-CM | POA: Insufficient documentation

## 2016-10-28 MED ORDER — LOSARTAN POTASSIUM-HCTZ 100-25 MG PO TABS: 1.0000 | ORAL_TABLET | Freq: Every day | ORAL | 0 refills | Status: DC

## 2016-10-28 NOTE — Progress Notes (Signed)
Patient: Steven Saunders Male    DOB: 1951-07-14   66 y.o.   MRN: [id number] Visit Date: 10/28/2016  Today's Provider: Lelon Huh, MD   Chief Complaint  Patient presents with  . Pneumonia    1 week follow up   Subjective:    HPI Follow up of Pneumonia:  Patient was last seen for this problem on 10/20/2016. Patient was advised to continue Levaquin and follow up in 1 week to repeat chest x ray. Patient was laso prescribed Hycodan cough syrup for cough. Today patient comes in reporting that he has taken all dose of Levaquin and feels better. He still has congestion in his left ear. Is no longer short of breath. He has started back on losartan hctz and is tolerating well.     No Known Allergies   Current Outpatient Prescriptions:  .  aspirin EC 81 MG tablet, Take 1 tablet (81 mg total) by mouth daily., Disp: 90 tablet, Rfl: 3 .  carvedilol (COREG) 25 MG tablet, Take 1 tablet (25 mg total) by mouth 2 (two) times daily with a meal., Disp: 180 tablet, Rfl: 3 .  digoxin (LANOXIN) 0.125 MG tablet, Take 1 tablet (0.125 mg total) by mouth daily., Disp: 90 tablet, Rfl: 3 .  docusate (COLACE) 60 MG/15ML syrup, Take 60 mg by mouth daily.  , Disp: , Rfl:  .  fluticasone (FLONASE) 50 MCG/ACT nasal spray, Place 2 sprays into both nostrils daily., Disp: 16 g, Rfl: 6 .  HYDROcodone-homatropine (HYCODAN) 5-1.5 MG/5ML syrup, 5 ml 4-6 hours as needed for cough, Disp: 120 mL, Rfl: 0 .  loratadine (CLARITIN) 10 MG tablet, Take 10 mg by mouth daily.  , Disp: , Rfl:  .  lovastatin (MEVACOR) 20 MG tablet, TAKE 1 TABLET EVERY DAY USUALLY IN THE EVENING, Disp: 30 tablet, Rfl: 11 .  Multiple Vitamin (MULTIVITAMIN) tablet, Take 1 tablet by mouth daily.  , Disp: , Rfl:  .  Naproxen Sodium (ALEVE PO), Take by mouth as needed (for headaches, pain etc...)., Disp: , Rfl:  .  NEOMYCIN-POLYMYXIN-HYDROCORTISONE (CORTISPORIN) 1 % SOLN otic solution, Place 3 drops into the left ear every 4 (four) hours., Disp:  10 mL, Rfl: 0 .  tadalafil (CIALIS) 20 MG tablet, Take 20 mg by mouth daily as needed.  , Disp: , Rfl:  .  valACYclovir (VALTREX) 1000 MG tablet, TAKE TWO TABLETS EVERY 12 HOURS IF NEEDED, Disp: 20 tablet, Rfl: 12 .  losartan-hydrochlorothiazide (HYZAAR) 100-25 MG tablet, Take 1 tablet by mouth daily., Disp: 1 tablet, Rfl: 0  Review of Systems  Constitutional: Negative for appetite change, chills, diaphoresis, fatigue and fever.  HENT: Positive for hearing loss (difficulty hearing in left ear).   Respiratory: Positive for shortness of breath. Negative for cough, chest tightness and wheezing.   Cardiovascular: Negative for chest pain and palpitations.  Gastrointestinal: Negative for abdominal pain, nausea and vomiting.  Musculoskeletal: Negative for myalgias.    Social History  Substance Use Topics  . Smoking status: Former Smoker    Packs/day: 2.00    Years: 15.00    Types: Cigarettes    Quit date: 11/12/2009  . Smokeless tobacco: Never Used  . Alcohol use 1.8 oz/week    3 Cans of beer per week     Comment: daily   Objective:    BP (!) 124/50   Pulse (!) 57   Temp 97.9 F (36.6 C) (Oral)   Resp 16   Wt 245 lb (  111.1 kg)   SpO2 99% Comment: room air  BMI 33.23 kg/m   Physical Exam   General Appearance:    Alert, cooperative, no distress  Eyes:    PERRL, conjunctiva/corneas clear, EOM's intact       Lungs:     Left lower lung rales. respirations unlabored  Heart:    Regular rate and rhythm  Neurologic:   Awake, alert, oriented x 3. No apparent focal neurological           defect.      CXR: IMPRESSION: Partial clearing of left lower lobe pneumonia. An additional follow-up chest x-ray in 2-3 weeks is recommended to assure complete clearing. If complete clearing does not occur, chest CT scanning will be needed to exclude occult malignancy.     Assessment & Plan:     1. Pneumonia of left lower lobe due to infectious organism [facility]) Clinically greatly improved, but  still not radiologically cleared. Will follow up and repeat chest XR in 3 weeks. Call if any worsening of symptoms.  - DG Chest 2 View; Future  2. Left ear pain Normal exam today. Consider ENT referral if not steadily improving the next 3-7 days.        Lelon Huh, MD  [facility]

## 2016-10-28 NOTE — Telephone Encounter (Signed)
-----   Message from Birdie Sons, MD sent at 10/28/2016  4:37 PM EST ----- Xray is clearing, but not back to normal. Need to repeat chest XR in 3 weeks. No need for more antibiotics.

## 2016-10-28 NOTE — Telephone Encounter (Signed)
Left message to call back  

## 2016-10-29 NOTE — Telephone Encounter (Signed)
Pt advised as below-aa 

## 2016-11-14 ENCOUNTER — Other Ambulatory Visit: Payer: Self-pay

## 2016-11-14 ENCOUNTER — Telehealth: Payer: Self-pay | Admitting: Family Medicine

## 2016-11-14 DIAGNOSIS — J181 Lobar pneumonia, unspecified organism: Principal | ICD-10-CM

## 2016-11-14 DIAGNOSIS — J189 Pneumonia, unspecified organism: Secondary | ICD-10-CM

## 2016-11-14 NOTE — Telephone Encounter (Signed)
Order has been entered, no appointment needed

## 2016-11-14 NOTE — Telephone Encounter (Signed)
Patient advised.

## 2016-11-14 NOTE — Telephone Encounter (Signed)
Pt states he is needing a follow up chest x-ray.  Pt would like to have this done on Monday morning.  CB#[phone]/MW

## 2016-11-15 ENCOUNTER — Encounter: Payer: Self-pay | Admitting: Emergency Medicine

## 2016-11-15 ENCOUNTER — Emergency Department
Admission: EM | Admit: 2016-11-15 | Discharge: 2016-11-16 | Disposition: A | Discharge status: Home / Self Care | Payer: PPO | Attending: Emergency Medicine | Admitting: Emergency Medicine

## 2016-11-15 ENCOUNTER — Emergency Department: Payer: PPO

## 2016-11-15 DIAGNOSIS — Z79899 Other long term (current) drug therapy: Secondary | ICD-10-CM | POA: Diagnosis not present

## 2016-11-15 DIAGNOSIS — Z7982 Long term (current) use of aspirin: Secondary | ICD-10-CM | POA: Diagnosis not present

## 2016-11-15 DIAGNOSIS — Z87891 Personal history of nicotine dependence: Secondary | ICD-10-CM | POA: Insufficient documentation

## 2016-11-15 DIAGNOSIS — J189 Pneumonia, unspecified organism: Secondary | ICD-10-CM | POA: Diagnosis not present

## 2016-11-15 DIAGNOSIS — R55 Syncope and collapse: Secondary | ICD-10-CM | POA: Diagnosis not present

## 2016-11-15 DIAGNOSIS — I10 Essential (primary) hypertension: Secondary | ICD-10-CM | POA: Diagnosis not present

## 2016-11-15 DIAGNOSIS — I959 Hypotension, unspecified: Secondary | ICD-10-CM | POA: Diagnosis not present

## 2016-11-15 LAB — BASIC METABOLIC PANEL
ANION GAP: 8 (ref 5–15)
BUN: 18 mg/dL (ref 6–20)
CALCIUM: 8.5 mg/dL — AB (ref 8.9–10.3)
CO2: 23 mmol/L (ref 22–32)
CREATININE: 1.13 mg/dL (ref 0.61–1.24)
Chloride: 100 mmol/L — ABNORMAL LOW (ref 101–111)
Glucose, Bld: 130 mg/dL — ABNORMAL HIGH (ref 65–99)
Potassium: 3 mmol/L — ABNORMAL LOW (ref 3.5–5.1)
SODIUM: 131 mmol/L — AB (ref 135–145)

## 2016-11-15 LAB — CBC
HCT: 34.5 % — ABNORMAL LOW (ref 40.0–52.0)
HEMOGLOBIN: 12.4 g/dL — AB (ref 13.0–18.0)
MCH: 33.7 pg (ref 26.0–34.0)
MCHC: 36.1 g/dL — ABNORMAL HIGH (ref 32.0–36.0)
MCV: 93.3 fL (ref 80.0–100.0)
PLATELETS: 142 10*3/uL — AB (ref 150–440)
RBC: 3.69 MIL/uL — ABNORMAL LOW (ref 4.40–5.90)
RDW: 14.2 % (ref 11.5–14.5)
WBC: 10.9 10*3/uL — ABNORMAL HIGH (ref 3.8–10.6)

## 2016-11-15 LAB — TROPONIN I

## 2016-11-15 MED ORDER — SODIUM CHLORIDE 0.9 % IV SOLN
Freq: Once | INTRAVENOUS | Status: AC
  Administered 2016-11-15: 22:00:00 via INTRAVENOUS
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
## 2016-11-15 NOTE — ED Notes (Signed)
Pt's wife arrived to bedside at this time.

## 2016-11-16 LAB — TROPONIN I: Troponin I: <0.03 ng/mL (ref <0.03)

## 2016-11-16 NOTE — ED Notes (Signed)
Pt resting in bed watching tv; no complaints at this time; repeat troponin drawn and sent to lab; disposition depends on results

## 2016-11-16 NOTE — ED Provider Notes (Signed)
Patient received in sign-out from Dr. Archie Balboa. Patient presented with  syncopal episode. Workup and evaluation pending repeat troponin and further evaluation and monitoring on telemetry. Patient ambulates and a steady gait. Workup thus far is unremarkable. Follow-up chest x-ray with resolved pneumonia. Do feel patient is stable for a code was follow-up with PCP.  Have discussed with the patient and available family all diagnostics and treatments performed thus far and all questions were answered to the best of my ability. The patient demonstrates understanding and agreement with plan.       Merlyn Lot, MD 11/16/16 [id number]

## 2016-11-16 NOTE — ED Notes (Signed)
Pt using call bell; unhooked from monitor to ambulate to bathroom to void; steady gait; understands repeat lab has resulted and MD aware; awaiting MD follow up and disposition

## 2016-11-26 ENCOUNTER — Other Ambulatory Visit: Payer: Self-pay | Admitting: Internal Medicine

## 2016-12-05 ENCOUNTER — Encounter: Payer: Self-pay | Admitting: *Deleted

## 2016-12-15 ENCOUNTER — Other Ambulatory Visit: Payer: Self-pay | Admitting: Internal Medicine

## 2016-12-23 ENCOUNTER — Encounter: Payer: Self-pay | Admitting: Internal Medicine

## 2016-12-23 ENCOUNTER — Ambulatory Visit (INDEPENDENT_AMBULATORY_CARE_PROVIDER_SITE_OTHER): Payer: PPO | Admitting: Internal Medicine

## 2016-12-23 VITALS — BP 132/90 | HR 71 | Ht 72.0 in | Wt 255.6 lb

## 2016-12-23 DIAGNOSIS — I482 Chronic atrial fibrillation, unspecified: Secondary | ICD-10-CM
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
## 2016-12-23 NOTE — Patient Instructions (Signed)
Medication Instructions:  Your physician recommends that you continue on your current medications as directed. Please refer to the Current Medication list given to you today.  Labwork: None ordered  Testing/Procedures: None ordered  Follow-Up: Your physician wants you to follow-up in: 1 year with Dr. Taylor. You will receive a reminder letter in the mail two months in advance. If you don't receive a letter, please call our office to schedule the follow-up appointment.  If you need a refill on your cardiac medications before your next appointment, please call your pharmacy.  Thank you for choosing [facility]!!        

## 2016-12-26 ENCOUNTER — Other Ambulatory Visit: Payer: Self-pay | Admitting: Family Medicine

## 2016-12-29 ENCOUNTER — Telehealth: Payer: Self-pay | Admitting: Internal Medicine

## 2016-12-29 NOTE — Telephone Encounter (Signed)
Patient calling states that Dr. 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 Patient states that [facility] sent a latter stating that patient will need to have another page filled out. Patient will fax over the second page and would like to know if Dr. Lovena Le would fill it out. Thanks.

## 2016-12-29 NOTE — Telephone Encounter (Signed)
Pt states Dr Lovena Le completed one form for [facility] at last office visit, but has second page that [facility] states needs to be completed, asking if Dr Lovena Le will complete.   Pt states he has faxed form this morning to Dr Tanna Furry attention. Pt advised I will forward to Dr Lovena Le for review.

## 2016-12-30 NOTE — Telephone Encounter (Signed)
Made pt aware I will fax second page in the morning.  Explained that Dr. Lovena Le and I completed it this afternoon. Pt thank me for my help

## 2017-01-01 NOTE — Telephone Encounter (Signed)
Advised pt that I fax went out this morning, confirmation received. Apologized for the delay. Pt was ok and thanked me several times for helping.

## 2017-02-24 ENCOUNTER — Other Ambulatory Visit: Payer: Self-pay | Admitting: Internal Medicine

## 2017-03-03 ENCOUNTER — Ambulatory Visit (INDEPENDENT_AMBULATORY_CARE_PROVIDER_SITE_OTHER): Payer: PPO

## 2017-03-03 VITALS — BP 146/78 | HR 72 | Temp 98.2°F | Ht 72.0 in | Wt 258.8 lb

## 2017-03-03 DIAGNOSIS — Z0000 Encounter for general adult medical examination without abnormal findings: Secondary | ICD-10-CM

## 2017-03-03 NOTE — Progress Notes (Signed)
Subjective:   Steven Saunders is a 66 y.o. male who presents for Medicare Annual/Subsequent preventive examination.  Review of Systems:  N/A  Cardiac Risk Factors include: advanced age (>68men, >35 women);dyslipidemia;hypertension;male gender;obesity (BMI >30kg/m2)     Objective:    Vitals: BP (!) 184/82 (BP Location: Right Arm)   Pulse 72   Temp 98.2 F (36.8 C) (Oral)   Ht 6' (1.829 m)   Wt 258 lb 12.8 oz (117.4 kg)   BMI 35.10 kg/m   Body mass index is 35.1 kg/m.  Tobacco History  Smoking Status  . Former Smoker  . Packs/day: 2.00  . Years: 15.00  . Types: Cigarettes  . Quit date: 11/12/2009  Smokeless Tobacco  . Never Used     Counseling given: Not Answered   Past Medical History:  Diagnosis Date  . Atrial fibrillation ([place])   . Hypertension    Past Surgical History:  Procedure Laterality Date  . APPENDECTOMY    . CARDIAC CATHETERIZATION  2007  . HERNIA REPAIR     1950's x 2  . thumb surgery  1980   had staph infection and had to cut out the area  . TOTAL HIP ARTHROPLASTY     Family History  Problem Relation Age of Onset  . Hypertension Mother   . Heart attack Father   . Melanoma Father    History  Sexual Activity  . Sexual activity: Not on file    Outpatient Encounter Prescriptions as of 03/03/2017  Medication Sig  . aspirin EC 81 MG tablet Take 1 tablet (81 mg total) by mouth daily.  . carvedilol (COREG) 25 MG tablet Take 1 tablet (25 mg total) by mouth 2 (two) times daily with a meal.  . digoxin (LANOXIN) 0.125 MG tablet TAKE 1 TABLET BY MOUTH DAILY  . docusate (COLACE) 60 MG/15ML syrup Take 60 mg by mouth daily.    Marland Kitchen loratadine (CLARITIN) 10 MG tablet Take 10 mg by mouth daily.    Marland Kitchen losartan-hydrochlorothiazide (HYZAAR) 100-25 MG tablet TAKE 1 TABLET BY MOUTH DAILY  . lovastatin (MEVACOR) 20 MG tablet TAKE 1 TABLET EVERY DAY USUALLY IN THE EVENING  . Multiple Vitamin (MULTIVITAMIN) tablet Take 1 tablet by mouth daily.    . Naproxen  Sodium (ALEVE PO) Take by mouth as needed (for headaches, pain etc...).  Marland Kitchen tadalafil (CIALIS) 20 MG tablet Take 20 mg by mouth daily as needed.    . valACYclovir (VALTREX) 1000 MG tablet TAKE TWO TABLETS EVERY 12 HOURS IF NEEDED   No facility-administered encounter medications on file as of 03/03/2017.     Activities of Daily Living In your present state of health, do you have any difficulty performing the following activities: 03/03/2017  Hearing? N  Vision? N  Difficulty concentrating or making decisions? N  Walking or climbing stairs? N  Dressing or bathing? N  Doing errands, shopping? N  Preparing Food and eating ? N  Using the Toilet? N  In the past six months, have you accidently leaked urine? N  Do you have problems with loss of bowel control? N  Managing your Medications? N  Managing your Finances? N  Housekeeping or managing your Housekeeping? N  Some recent data might be hidden    Patient Care Team: Birdie Sons, MD as PCP - General (Family Medicine) Evans Lance, MD as Consulting Physician (Cardiology) Earnestine Leys, MD (Orthopedic Surgery)   Assessment:     Exercise Activities and Dietary recommendations Current Exercise  Habits: The patient does not participate in regular exercise at present, Exercise limited by: None identified  Goals    . Reduce sugar intake           Recommend decreasing sugar intake in daily diet. Pt to eat 1 serving of ice cream every other day, rather than daily.       Fall Risk Fall Risk  03/03/2017 01/10/2016  Falls in the past year? No No   Depression Screen PHQ 2/9 Scores 03/03/2017 03/03/2017 01/10/2016  PHQ - 2 Score 0 0 0  PHQ- 9 Score 0 - -    Cognitive Function     6CIT Screen 03/03/2017  What Year? 0 points  What month? 0 points  What time? 0 points  Count back from 20 0 points  Months in reverse 0 points  Repeat phrase 0 points  Total Score 0    Immunization History  Administered Date(s) Administered  .  Pneumococcal Conjugate-13 04/14/2014  . Pneumococcal Polysaccharide-23 01/10/2016  . Tdap 04/14/2014   Screening Tests Health Maintenance  Topic Date Due  . INFLUENZA VACCINE  09/23/2017 (Originally 04/01/2017)  . COLONOSCOPY  04/04/2017  . TETANUS/TDAP  04/14/2024  . Hepatitis C Screening  Completed  . PNA vac Low Risk Adult  Completed      Plan:  I have personally reviewed and addressed the Medicare Annual Wellness questionnaire and have noted the following in the patient's chart:  A. Medical and social history B. Use of alcohol, tobacco or illicit drugs  C. Current medications and supplements D. Functional ability and status E.  Nutritional status F.  Physical activity G. Advance directives H. List of other physicians I.  Hospitalizations, surgeries, and ER visits in previous 12 months J.  [place] such as hearing and vision if needed, cognitive and depression L. Referrals and appointments - none  In addition, I have reviewed and discussed with patient certain preventive protocols, quality metrics, and best practice recommendations. A written personalized care plan for preventive services as well as general preventive health recommendations were provided to patient.  See attached scanned questionnaire for additional information.   Signed,  Fabio Neighbors, LPN Nurse Health Advisor   MD Recommendations: None.

## 2017-03-03 NOTE — Patient Instructions (Signed)
[name] , Thank you for taking time to come for your Medicare Wellness Visit. I appreciate your ongoing commitment to your health goals. Please review the following plan we discussed and let me know if I can assist you in the future.   Screening recommendations/referrals: Colonoscopy: completed 04/04/14, due 04/2024 Recommended yearly ophthalmology/optometry visit for glaucoma screening and checkup Recommended yearly dental visit for hygiene and checkup  Vaccinations: Influenza vaccine: due 05/2017 Pneumococcal vaccine: completed series Tdap vaccine: completed 04/14/14, due 04/14/2024 Shingles vaccine: declined    Advanced directives: Please bring a copy of your POA (Power of Attorney) and/or Living Will to your next appointment.   Conditions/risks identified: Recommend decreasing sugar intake in daily diet. Pt to eat 1 serving of ice cream every other day, rather than daily.   Next appointment: None, need to schedule follow up with PCP and 1 year AWV.  Preventive Care 9 Years and Older, Male Preventive care refers to lifestyle choices and visits with your health care provider that can promote health and wellness. What does preventive care include?  A yearly physical exam. This is also called an annual well check.  Dental exams once or twice a year.  Routine eye exams. Ask your health care provider how often you should have your eyes checked.  Personal lifestyle choices, including:  Daily care of your teeth and gums.  Regular physical activity.  Eating a healthy diet.  Avoiding tobacco and drug use.  Limiting alcohol use.  Practicing safe sex.  Taking low doses of aspirin every day.  Taking vitamin and mineral supplements as recommended by your health care provider. What happens during an annual well check? The services and screenings done by your health care provider during your annual well check will depend on your age, overall health, lifestyle risk factors,  and family history of disease. Counseling  Your health care provider may ask you questions about your:  Alcohol use.  Tobacco use.  Drug use.  Emotional well-being.  Home and relationship well-being.  Sexual activity.  Eating habits.  History of falls.  Memory and ability to understand (cognition).  Work and work Statistician. Screening  You may have the following tests or measurements:  Height, weight, and BMI.  Blood pressure.  Lipid and cholesterol levels. These may be checked every 5 years, or more frequently if you are over 54 years old.  Skin check.  Lung cancer screening. You may have this screening every year starting at age 66 if you have a 30-pack-year history of smoking and currently smoke or have quit within the past 15 years.  Fecal occult blood test (FOBT) of the stool. You may have this test every year starting at age 64.  Flexible sigmoidoscopy or colonoscopy. You may have a sigmoidoscopy every 5 years or a colonoscopy every 10 years starting at age 65.  Prostate cancer screening. Recommendations will vary depending on your family history and other risks.  Hepatitis C blood test.  Hepatitis B blood test.  Sexually transmitted disease (STD) testing.  Diabetes screening. This is done by checking your blood sugar (glucose) after you have not eaten for a while (fasting). You may have this done every 1-3 years.  Abdominal aortic aneurysm (AAA) screening. You may need this if you are a current or former smoker.  Osteoporosis. You may be screened starting at age 38 if you are at high risk. Talk with your health care provider about your test results, treatment options, and if necessary, the need for  more tests. Vaccines  Your health care provider may recommend certain vaccines, such as:  Influenza vaccine. This is recommended every year.  Tetanus, diphtheria, and acellular pertussis (Tdap, Td) vaccine. You may need a Td booster every 10  years.  Zoster vaccine. You may need this after age 34.  Pneumococcal 13-valent conjugate (PCV13) vaccine. One dose is recommended after age 1.  Pneumococcal polysaccharide (PPSV23) vaccine. One dose is recommended after age 11. Talk to your health care provider about which screenings and vaccines you need and how often you need them. This information is not intended to replace advice given to you by your health care provider. Make sure you discuss any questions you have with your health care provider. Document Released: 09/14/2015 Document Revised: 05/07/2016 Document Reviewed: 06/19/2015 Elsevier Interactive Patient Education  2017 [employer] Prevention in the Home Falls can cause injuries. They can happen to people of all ages. There are many things you can do to make your home safe and to help prevent falls. What can I do on the outside of my home?  Regularly fix the edges of walkways and driveways and fix any cracks.  Remove anything that might make you trip as you walk through a door, such as a raised step or threshold.  Trim any bushes or trees on the path to your home.  Use bright outdoor lighting.  Clear any walking paths of anything that might make someone trip, such as rocks or tools.  Regularly check to see if handrails are loose or broken. Make sure that both sides of any steps have handrails.  Any raised decks and porches should have guardrails on the edges.  Have any leaves, snow, or ice cleared regularly.  Use sand or salt on walking paths during winter.  Clean up any spills in your garage right away. This includes oil or grease spills. What can I do in the bathroom?  Use night lights.  Install grab bars by the toilet and in the tub and shower. Do not use towel bars as grab bars.  Use non-skid mats or decals in the tub or shower.  If you need to sit down in the shower, use a plastic, non-slip stool.  Keep the floor dry. Clean up any water that  spills on the floor as soon as it happens.  Remove soap buildup in the tub or shower regularly.  Attach bath mats securely with double-sided non-slip rug tape.  Do not have throw rugs and other things on the floor that can make you trip. What can I do in the bedroom?  Use night lights.  Make sure that you have a light by your bed that is easy to reach.  Do not use any sheets or blankets that are too big for your bed. They should not hang down onto the floor.  Have a firm chair that has side arms. You can use this for support while you get dressed.  Do not have throw rugs and other things on the floor that can make you trip. What can I do in the kitchen?  Clean up any spills right away.  Avoid walking on wet floors.  Keep items that you use a lot in easy-to-reach places.  If you need to reach something above you, use a strong step stool that has a grab bar.  Keep electrical cords out of the way.  Do not use floor polish or wax that makes floors slippery. If you must use wax, use non-skid  floor wax.  Do not have throw rugs and other things on the floor that can make you trip. What can I do with my stairs?  Do not leave any items on the stairs.  Make sure that there are handrails on both sides of the stairs and use them. Fix handrails that are broken or loose. Make sure that handrails are as long as the stairways.  Check any carpeting to make sure that it is firmly attached to the stairs. Fix any carpet that is loose or worn.  Avoid having throw rugs at the top or bottom of the stairs. If you do have throw rugs, attach them to the floor with carpet tape.  Make sure that you have a light switch at the top of the stairs and the bottom of the stairs. If you do not have them, ask someone to add them for you. What else can I do to help prevent falls?  Wear shoes that:  Do not have high heels.  Have rubber bottoms.  Are comfortable and fit you well.  Are closed at the  toe. Do not wear sandals.  If you use a stepladder:  Make sure that it is fully opened. Do not climb a closed stepladder.  Make sure that both sides of the stepladder are locked into place.  Ask someone to hold it for you, if possible.  Clearly mark and make sure that you can see:  Any grab bars or handrails.  First and last steps.  Where the edge of each step is.  Use tools that help you move around (mobility aids) if they are needed. These include:  Canes.  Walkers.  Scooters.  Crutches.  Turn on the lights when you go into a dark area. Replace any light bulbs as soon as they burn out.  Set up your furniture so you have a clear path. Avoid moving your furniture around.  If any of your floors are uneven, fix them.  If there are any pets around you, be aware of where they are.  Review your medicines with your doctor. Some medicines can make you feel dizzy. This can increase your chance of falling. Ask your doctor what other things that you can do to help prevent falls. This information is not intended to replace advice given to you by your health care provider. Make sure you discuss any questions you have with your health care provider. Document Released: 06/14/2009 Document Revised: 01/24/2016 Document Reviewed: 09/22/2014 Elsevier Interactive Patient Education  2017 [employer].

## 2017-03-17 ENCOUNTER — Other Ambulatory Visit: Payer: Self-pay | Admitting: Family Medicine

## 2017-04-09 ENCOUNTER — Ambulatory Visit (INDEPENDENT_AMBULATORY_CARE_PROVIDER_SITE_OTHER): Payer: PPO | Admitting: Family Medicine

## 2017-04-09 ENCOUNTER — Encounter: Payer: Self-pay | Admitting: Family Medicine

## 2017-04-09 VITALS — BP 130/90 | HR 59 | Temp 98.6°F | Resp 16 | Ht 72.0 in | Wt 261.0 lb

## 2017-04-09 DIAGNOSIS — E785 Hyperlipidemia, unspecified: Secondary | ICD-10-CM

## 2017-04-09 DIAGNOSIS — Z0000 Encounter for general adult medical examination without abnormal findings: Secondary | ICD-10-CM

## 2017-04-09 DIAGNOSIS — Z86010 Personal history of colonic polyps: Secondary | ICD-10-CM

## 2017-04-09 DIAGNOSIS — I482 Chronic atrial fibrillation, unspecified: Secondary | ICD-10-CM

## 2017-04-09 DIAGNOSIS — I10 Essential (primary) hypertension: Secondary | ICD-10-CM | POA: Diagnosis not present

## 2017-04-09 DIAGNOSIS — Z125 Encounter for screening for malignant neoplasm of prostate: Secondary | ICD-10-CM

## 2017-04-09 NOTE — Progress Notes (Signed)
Patient: Steven Saunders, Male    DOB: Nov 11, 1950, 66 y.o.   MRN: [id number] Visit Date: 04/09/2017  Today's Provider: Lelon Huh, MD   Chief Complaint  Patient presents with  . Annual Exam  . Hypertension  . Hyperlipidemia   Subjective:     Annual physical exam Steven Saunders is a 66 y.o. male who presents today for health maintenance and complete physical. He feels well. He reports exercising some. He reports he is sleeping well.  ----------------------------------------------------------------   Patient saw [facility] 03/03/2017 for AWV.   Hypertension, follow-up:  BP Readings from Last 3 Encounters:  04/09/17 130/90  03/03/17 (!) 146/78  12/23/16 132/90    He was last seen for hypertension 6 months ago.  BP at that visit was . Management since that visit includes; patient advised to stop HCTZ for the time being due to hypotension.He reports good compliance with treatment. He is not having side effects. none He is exercising. He is adherent to low salt diet.   Outside blood pressures are 130/80. He is experiencing none.  Patient denies none.   Cardiovascular risk factors include none.  Use of agents associated with hypertension: none.   ----------------------------------------------------------------     Lipid/Cholesterol, Follow-up:   Last seen for this 1 years ago.  Management since that visit includes.  Last Lipid Panel:    Component Value Date/Time   CHOL 153 01/10/2016 1057   TRIG 178 (H) 01/10/2016 1057   HDL 50 01/10/2016 1057   CHOLHDL 3.1 01/10/2016 1057   LDLCALC 67 01/10/2016 1057    He reports good compliance with treatment. He is not having side effects. none  Wt Readings from Last 3 Encounters:  04/09/17 261 lb (118.4 kg)  03/03/17 258 lb 12.8 oz (117.4 kg)  12/23/16 255 lb 9.6 oz (115.9 kg)    ----------------------------------------------------------------  Chronic atrial fibrillation (HCC) Continues to follow  up regularly with Dr. Lovena Le. He continues on ASA but still will not take NOAC. He states he does tend to bleed easily on ASA.      Review of Systems  Constitutional: Negative for chills, diaphoresis and fever.  HENT: Negative for congestion, ear discharge, ear pain, hearing loss, nosebleeds, sore throat and tinnitus.   Eyes: Negative for photophobia, pain, discharge and redness.  Respiratory: Negative for cough, shortness of breath, wheezing and stridor.   Cardiovascular: Negative for chest pain, palpitations and leg swelling.  Gastrointestinal: Negative for abdominal pain, blood in stool, constipation, diarrhea, nausea and vomiting.  Endocrine: Negative for polydipsia.  Genitourinary: Negative for dysuria, flank pain, frequency, hematuria and urgency.  Musculoskeletal: Negative for back pain, myalgias and neck pain.  Skin: Negative for rash.  Allergic/Immunologic: Negative for environmental allergies.  Neurological: Negative for dizziness, tremors, seizures, weakness and headaches.  Hematological: Does not bruise/bleed easily.  Psychiatric/Behavioral: Negative for hallucinations and suicidal ideas. The patient is not nervous/anxious.     Social History      He  reports that he quit smoking about 7 years ago. His smoking use included Cigarettes. He has a 30.00 pack-year smoking history. He has never used smokeless tobacco. He reports that he drinks about 0.6 - 1.2 oz of alcohol per week . He reports that he does not use drugs.       Social History   Social History  . Marital status: Married    Spouse name: N/A  . Number of children: 2  . Years of education: N/A  Occupational History  . Retired     Still drives school bus   Social History Main Topics  . Smoking status: Former Smoker    Packs/day: 2.00    Years: 15.00    Types: Cigarettes    Quit date: 11/12/2009  . Smokeless tobacco: Never Used  . Alcohol use 0.6 - 1.2 oz/week    1 - 2 Cans of beer per week  . Drug use:  No  . Sexual activity: Not Asked   Other Topics Concern  . None   Social History Narrative  . None    Past Medical History:  Diagnosis Date  . Atrial fibrillation ([place])   . Hypertension      Patient Active Problem List   Diagnosis Date Noted  . Pneumonia 10/13/2016  . Nevus of back 01/10/2016  . History of colonic polyps 01/10/2016  . History of adenomatous polyp of colon 01/08/2016  . Hyperlipidemia 01/08/2016  . Enthesopathy of hip 01/08/2016  . Impotence of organic origin 01/08/2016  . Abnormal blood sugar 01/08/2016  . History of tobacco use 01/08/2016  . OA (osteoarthritis) of hip 01/08/2016  . OBESITY-MORBID (>100') 11/17/2008  . Essential hypertension 11/17/2008  . ATRIAL FIBRILLATION 11/17/2008    Past Surgical History:  Procedure Laterality Date  . APPENDECTOMY    . CARDIAC CATHETERIZATION  2007  . HERNIA REPAIR     1950's x 2  . thumb surgery  1980   had staph infection and had to cut out the area  . TOTAL HIP ARTHROPLASTY      Family History        Family Status  Relation Status  . Mother Deceased       Cause of death: Complications from a fall; broken hip  . Father Deceased at age 39       melanoma  . Sister Alive        His family history includes Heart attack in his father; Hypertension in his mother; Melanoma in his father.     No Known Allergies   Current Outpatient Prescriptions:  .  aspirin EC 81 MG tablet, Take 1 tablet (81 mg total) by mouth daily., Disp: 90 tablet, Rfl: 3 .  carvedilol (COREG) 25 MG tablet, TAKE 1 TABLET BY MOUTH TWO TIMES DAILY WITH A MEAL, Disp: 180 tablet, Rfl: 3 .  digoxin (LANOXIN) 0.125 MG tablet, TAKE 1 TABLET BY MOUTH DAILY, Disp: 90 tablet, Rfl: 2 .  docusate (COLACE) 60 MG/15ML syrup, Take 60 mg by mouth daily.  , Disp: , Rfl:  .  loratadine (CLARITIN) 10 MG tablet, Take 10 mg by mouth daily.  , Disp: , Rfl:  .  losartan-hydrochlorothiazide (HYZAAR) 100-25 MG tablet, TAKE 1 TABLET BY MOUTH DAILY, Disp:  30 tablet, Rfl: 12 .  lovastatin (MEVACOR) 20 MG tablet, TAKE 1 TABLET EVERY DAY USUALLY IN THE EVENING, Disp: 30 tablet, Rfl: 11 .  Multiple Vitamin (MULTIVITAMIN) tablet, Take 1 tablet by mouth daily.  , Disp: , Rfl:  .  Naproxen Sodium (ALEVE PO), Take by mouth as needed (for headaches, pain etc...)., Disp: , Rfl:  .  tadalafil (CIALIS) 20 MG tablet, Take 20 mg by mouth daily as needed.  , Disp: , Rfl:  .  valACYclovir (VALTREX) 1000 MG tablet, TAKE TWO TABLETS EVERY 12 HOURS IF NEEDED, Disp: 20 tablet, Rfl: 12   Patient Care Team: Birdie Sons, MD as PCP - General (Family Medicine) Evans Lance, MD as Consulting Physician (Cardiology) Earnestine Leys,  MD (Orthopedic Surgery)      Objective:   Vitals: BP 130/90 (BP Location: Right Arm, Patient Position: Sitting, Cuff Size: Large)   Pulse (!) 59   Temp 98.6 F (37 C) (Oral)   Resp 16   Ht 6' (1.829 m)   Wt 261 lb (118.4 kg)   SpO2 98%   BMI 35.40 kg/m    Vitals:   04/09/17 0911  BP: 130/90  Pulse: (!) 59  Resp: 16  Temp: 98.6 F (37 C)  TempSrc: Oral  SpO2: 98%  Weight: 261 lb (118.4 kg)  Height: 6' (1.829 m)     Physical Exam   General Appearance:    Alert, cooperative, no distress, appears stated age  Head:    Normocephalic, without obvious abnormality, atraumatic  Eyes:    PERRL, conjunctiva/corneas clear, EOM's intact, fundi    benign, both eyes       Ears:    Normal TM's and external ear canals, both ears  Nose:   Nares normal, septum midline, mucosa normal, no drainage   or sinus tenderness  Throat:   Lips, mucosa, and tongue normal; teeth and gums normal  Neck:   Supple, symmetrical, trachea midline, no adenopathy;       thyroid:  No enlargement/tenderness/nodules; no carotid   bruit or JVD  Back:     Symmetric, no curvature, ROM normal, no CVA tenderness  Lungs:     Clear to auscultation bilaterally, respirations unlabored  Chest wall:    No tenderness or deformity  Heart:     Irregularly  irregular rhythm. Normal rate , S1 and S2 normal, no murmur, rub   or gallop  Abdomen:     Soft, non-tender, bowel sounds active all four quadrants,    no masses, no organomegaly  Genitalia:    deferred  Rectal:    deferred  Extremities:   Extremities normal, atraumatic, no cyanosis or edema  Pulses:   2+ and symmetric all extremities  Skin:   Skin color, texture, turgor normal, no rashes or lesions  Lymph nodes:   Cervical, supraclavicular, and axillary nodes normal  Neurologic:   CNII-XII intact. Normal strength, sensation and reflexes      throughout     Depression Screen PHQ 2/9 Scores 03/03/2017 03/03/2017 01/10/2016  PHQ - 2 Score 0 0 0  PHQ- 9 Score 0 - -      Assessment & Plan:     Routine Health Maintenance and Physical Exam  Exercise Activities and Dietary recommendations Goals    . Reduce sugar intake           Recommend decreasing sugar intake in daily diet. Pt to eat 1 serving of ice cream every other day, rather than daily.        Immunization History  Administered Date(s) Administered  . Pneumococcal Conjugate-13 04/14/2014  . Pneumococcal Polysaccharide-23 01/10/2016  . Tdap 04/14/2014    Health Maintenance  Topic Date Due  . COLONOSCOPY  04/04/2017  . INFLUENZA VACCINE  09/23/2017 (Originally 04/01/2017)  . TETANUS/TDAP  04/14/2024  . Hepatitis C Screening  Completed  . PNA vac Low Risk Adult  Completed     Discussed health benefits of physical activity, and encouraged him to engage in regular exercise appropriate for his age and condition.    -------------------------------------------------------------------- 1. Annual physical exam Doing well.  - Comprehensive metabolic panel - Lipid panel  2. Essential hypertension Well controlled.  Continue current medications.   - Comprehensive metabolic panel - Lipid  panel  3. Chronic atrial fibrillation (HCC) Rate controlled. Patient refuses Noac or warfarin, will continue ECASE.   4. Morbid  obesity ([place]) Diet and exercise, remains physically active.   5. Hyperlipidemia, unspecified hyperlipidemia type He is tolerating lovastatin well with no adverse effects.   - Comprehensive metabolic panel - Lipid panel  6. History of adenomatous polyp of colon Last excised multiple tubular adenomas by Dr. Allen Norris 04/04/2014 with 3 year follow up colonoscopy recommended.  - Ambulatory referral to Gastroenterology  7. Prostate cancer screening  - PSA    Lelon Huh, MD  [facility]

## 2017-04-09 NOTE — Patient Instructions (Addendum)
   The CDC recommends two doses of Shingrix separated by 2 to 6 months for adults age 68 years and older. I recommend checking with your pharmacist regarding coverage for this vaccine.

## 2017-04-10 ENCOUNTER — Other Ambulatory Visit: Payer: Self-pay | Admitting: Family Medicine

## 2017-04-10 DIAGNOSIS — R972 Elevated prostate specific antigen [PSA]: Secondary | ICD-10-CM | POA: Insufficient documentation

## 2017-04-10 DIAGNOSIS — R7309 Other abnormal glucose: Secondary | ICD-10-CM

## 2017-04-10 LAB — LIPID PANEL
CHOL/HDL RATIO: 2.6 ratio (ref 0.0–5.0)
Cholesterol, Total: 136 mg/dL (ref 100–199)
HDL: 53 mg/dL (ref >39)
LDL CALC: 58 mg/dL (ref 0–99)
TRIGLYCERIDES: 127 mg/dL (ref 0–149)
VLDL CHOLESTEROL CAL: 25 mg/dL (ref 5–40)

## 2017-04-10 LAB — COMPREHENSIVE METABOLIC PANEL
ALT: 28 IU/L (ref 0–44)
AST: 22 IU/L (ref 0–40)
Albumin/Globulin Ratio: 1.9 (ref 1.2–2.2)
Albumin: 4.5 g/dL (ref 3.6–4.8)
Alkaline Phosphatase: 62 IU/L (ref 39–117)
BUN/Creatinine Ratio: 14 (ref 10–24)
BUN: 10 mg/dL (ref 8–27)
Bilirubin Total: 0.8 mg/dL (ref 0.0–1.2)
CALCIUM: 9.4 mg/dL (ref 8.6–10.2)
CO2: 24 mmol/L (ref 20–29)
CREATININE: 0.69 mg/dL — AB (ref 0.76–1.27)
Chloride: 95 mmol/L — ABNORMAL LOW (ref 96–106)
GFR calc Af Amer: 113 mL/min/{1.73_m2} (ref >59)
GFR, EST NON AFRICAN AMERICAN: 98 mL/min/{1.73_m2} (ref >59)
GLUCOSE: 116 mg/dL — AB (ref 65–99)
Globulin, Total: 2.4 g/dL (ref 1.5–4.5)
POTASSIUM: 4.8 mmol/L (ref 3.5–5.2)
Sodium: 133 mmol/L — ABNORMAL LOW (ref 134–144)
TOTAL PROTEIN: 6.9 g/dL (ref 6.0–8.5)

## 2017-04-10 LAB — PSA: Prostate Specific Ag, Serum: 5.3 ng/mL — ABNORMAL HIGH (ref 0.0–4.0)

## 2017-04-10 NOTE — Progress Notes (Signed)
Recheck psa and check hgba1c around 05/11/17

## 2017-04-14 ENCOUNTER — Encounter: Payer: Self-pay | Admitting: Family Medicine

## 2017-04-21 ENCOUNTER — Telehealth: Payer: Self-pay

## 2017-04-21 ENCOUNTER — Other Ambulatory Visit: Payer: Self-pay

## 2017-04-21 ENCOUNTER — Other Ambulatory Visit: Payer: Self-pay | Admitting: Family Medicine

## 2017-04-21 DIAGNOSIS — Z86010 Personal history of colonic polyps: Secondary | ICD-10-CM

## 2017-04-21 NOTE — Telephone Encounter (Signed)
Gastroenterology Pre-Procedure Review  Request Date: 9/25 Requesting Physician: Dr. Allen Norris  PATIENT REVIEW QUESTIONS: The patient responded to the following health history questions as indicated:    1. Are you having any GI issues? no 2. Do you have a personal history of Polyps? yes (removed 04/2014) 3. Do you have a family history of Colon Cancer or Polyps? No 4. Diabetes Mellitus? no 5. Joint replacements in the past 12 months?no 6. Major health problems in the past 3 months?no 7. Any artificial heart valves, MVP, or defibrillator?yes (A-Fib)    MEDICATIONS & ALLERGIES:    Patient reports the following regarding taking any anticoagulation/antiplatelet therapy:   Plavix, Coumadin, Eliquis, Xarelto, Lovenox, Pradaxa, Brilinta, or Effient? no Aspirin? yes (81mg )  Patient confirms/reports the following medications:  Current Outpatient Prescriptions  Medication Sig Dispense Refill  . aspirin EC 81 MG tablet Take 1 tablet (81 mg total) by mouth daily. 90 tablet 3  . carvedilol (COREG) 25 MG tablet TAKE 1 TABLET BY MOUTH TWO TIMES DAILY WITH A MEAL 180 tablet 3  . digoxin (LANOXIN) 0.125 MG tablet TAKE 1 TABLET BY MOUTH DAILY 90 tablet 2  . docusate (COLACE) 60 MG/15ML syrup Take 60 mg by mouth daily.      Marland Kitchen loratadine (CLARITIN) 10 MG tablet Take 10 mg by mouth daily.      Marland Kitchen losartan-hydrochlorothiazide (HYZAAR) 100-25 MG tablet TAKE 1 TABLET BY MOUTH DAILY 30 tablet 12  . lovastatin (MEVACOR) 20 MG tablet TAKE 1 TABLET EVERY DAY USUALLY IN THE EVENING 30 tablet 11  . Multiple Vitamin (MULTIVITAMIN) tablet Take 1 tablet by mouth daily.      . Naproxen Sodium (ALEVE PO) Take by mouth as needed (for headaches, pain etc...).    Marland Kitchen tadalafil (CIALIS) 20 MG tablet Take 20 mg by mouth daily as needed.      . valACYclovir (VALTREX) 1000 MG tablet TAKE TWO TABLETS EVERY 12 HOURS IF NEEDED 20 tablet 12   No current facility-administered medications for this visit.     Patient confirms/reports  the following allergies:  No Known Allergies  No orders of the defined types were placed in this encounter.   AUTHORIZATION INFORMATION Primary Insurance: 1D#: Group #:  Secondary Insurance: 1D#: Group #:  SCHEDULE INFORMATION: Date: 9/25 Time: Location: [facility]

## 2017-05-03 ENCOUNTER — Telehealth: Payer: Self-pay | Admitting: Family Medicine

## 2017-05-03 DIAGNOSIS — R972 Elevated prostate specific antigen [PSA]: Secondary | ICD-10-CM

## 2017-05-03 DIAGNOSIS — R7309 Other abnormal glucose: Secondary | ICD-10-CM

## 2017-05-03 NOTE — Telephone Encounter (Signed)
-----   Message from Birdie Sons, MD sent at 05/01/2017  8:35 AM EDT ----- Regarding: FW: recheck psa and a1c in september   ----- Message ----- From: Birdie Sons, MD Sent: 04/28/2017 To: Birdie Sons, MD Subject: recheck psa and a1c in september

## 2017-05-03 NOTE — Telephone Encounter (Signed)
Is time to recheck psa and a1c. Please print future order from 8/10 and advise patient to pick up at front desk.

## 2017-05-18 NOTE — Telephone Encounter (Signed)
Patient was notified. Lap slip printed.

## 2017-05-18 NOTE — Telephone Encounter (Signed)
Please advise patient it is time to check PSA which was elevated in August, and hgba1c due to elevated blood sugar in August.

## 2017-05-19 ENCOUNTER — Other Ambulatory Visit: Payer: Self-pay | Admitting: Family Medicine

## 2017-05-19 DIAGNOSIS — R972 Elevated prostate specific antigen [PSA]: Secondary | ICD-10-CM | POA: Diagnosis not present

## 2017-05-19 DIAGNOSIS — R7309 Other abnormal glucose: Secondary | ICD-10-CM | POA: Diagnosis not present

## 2017-05-20 ENCOUNTER — Telehealth: Payer: Self-pay

## 2017-05-20 ENCOUNTER — Other Ambulatory Visit: Payer: Self-pay | Admitting: Family Medicine

## 2017-05-20 DIAGNOSIS — R972 Elevated prostate specific antigen [PSA]: Secondary | ICD-10-CM

## 2017-05-20 LAB — REFLEX PSA, FREE
PSA, % FREE: 11 % — AB (ref >25)
PSA, Free: 0.8 ng/mL

## 2017-05-20 LAB — PSA, TOTAL WITH REFLEX TO PSA, FREE: PSA, Total: 7.3 ng/mL — ABNORMAL HIGH (ref <=4.0)

## 2017-05-20 LAB — HEMOGLOBIN A1C W/OUT EAG: HEMOGLOBIN A1C: 5.6 %{Hb} (ref <5.7)

## 2017-05-20 NOTE — Telephone Encounter (Signed)
Patient advised as below.  

## 2017-05-20 NOTE — Telephone Encounter (Signed)
-----   Message from Birdie Sons, MD sent at 05/20/2017  4:27 PM EDT ----- a1c (blood sugar) is normal, but PSA is elevated. Need referral to urology for further evaluation. Have sent referral to sarah

## 2017-05-26 ENCOUNTER — Encounter
Admission: RE | Disposition: A | Discharge status: Home / Self Care | Payer: Self-pay | Source: Ambulatory Visit | Attending: Gastroenterology

## 2017-05-26 ENCOUNTER — Encounter: Payer: Self-pay | Admitting: *Deleted

## 2017-05-26 ENCOUNTER — Ambulatory Visit: Payer: PPO | Admitting: Certified Registered"

## 2017-05-26 ENCOUNTER — Ambulatory Visit
Admission: RE | Admit: 2017-05-26 | Discharge: 2017-05-26 | Disposition: A | Discharge status: Home / Self Care | Payer: PPO | Source: Ambulatory Visit | Attending: Gastroenterology | Admitting: Gastroenterology

## 2017-05-26 DIAGNOSIS — K649 Unspecified hemorrhoids: Secondary | ICD-10-CM | POA: Diagnosis not present

## 2017-05-26 DIAGNOSIS — D123 Benign neoplasm of transverse colon: Secondary | ICD-10-CM

## 2017-05-26 DIAGNOSIS — Z1211 Encounter for screening for malignant neoplasm of colon: Secondary | ICD-10-CM | POA: Insufficient documentation

## 2017-05-26 DIAGNOSIS — I10 Essential (primary) hypertension: Secondary | ICD-10-CM | POA: Insufficient documentation

## 2017-05-26 DIAGNOSIS — Z6835 Body mass index (BMI) 35.0-35.9, adult: Secondary | ICD-10-CM | POA: Diagnosis not present

## 2017-05-26 DIAGNOSIS — Z86010 Personal history of colonic polyps: Secondary | ICD-10-CM | POA: Insufficient documentation

## 2017-05-26 DIAGNOSIS — K635 Polyp of colon: Secondary | ICD-10-CM | POA: Diagnosis not present

## 2017-05-26 DIAGNOSIS — K640 First degree hemorrhoids: Secondary | ICD-10-CM | POA: Diagnosis not present

## 2017-05-26 DIAGNOSIS — Z79899 Other long term (current) drug therapy: Secondary | ICD-10-CM | POA: Diagnosis not present

## 2017-05-26 DIAGNOSIS — E669 Obesity, unspecified: Secondary | ICD-10-CM | POA: Diagnosis not present

## 2017-05-26 DIAGNOSIS — Z87891 Personal history of nicotine dependence: Secondary | ICD-10-CM | POA: Diagnosis not present

## 2017-05-26 DIAGNOSIS — Z8249 Family history of ischemic heart disease and other diseases of the circulatory system: Secondary | ICD-10-CM | POA: Insufficient documentation

## 2017-05-26 DIAGNOSIS — M1990 Unspecified osteoarthritis, unspecified site: Secondary | ICD-10-CM | POA: Insufficient documentation

## 2017-05-26 DIAGNOSIS — D125 Benign neoplasm of sigmoid colon: Secondary | ICD-10-CM | POA: Diagnosis not present

## 2017-05-26 DIAGNOSIS — Z809 Family history of malignant neoplasm, unspecified: Secondary | ICD-10-CM | POA: Insufficient documentation

## 2017-05-26 DIAGNOSIS — Z96649 Presence of unspecified artificial hip joint: Secondary | ICD-10-CM | POA: Diagnosis not present

## 2017-05-26 DIAGNOSIS — I4891 Unspecified atrial fibrillation: Secondary | ICD-10-CM | POA: Diagnosis not present

## 2017-05-26 DIAGNOSIS — Z7982 Long term (current) use of aspirin: Secondary | ICD-10-CM | POA: Insufficient documentation

## 2017-05-26 HISTORY — PX: COLONOSCOPY WITH PROPOFOL: SHX5780

## 2017-05-26 SURGERY — COLONOSCOPY WITH PROPOFOL
Anesthesia: General

## 2017-05-26 MED ORDER — PROPOFOL 10 MG/ML IV BOLUS
INTRAVENOUS | Status: DC | PRN
  Administered 2017-05-26 (×2): 50 mg via INTRAVENOUS

## 2017-05-26 MED ORDER — LIDOCAINE HCL (CARDIAC) 20 MG/ML IV SOLN
INTRAVENOUS | Status: DC | PRN
  Administered 2017-05-26: 40 mg via INTRAVENOUS

## 2017-05-26 MED ORDER — MIDAZOLAM HCL 2 MG/2ML IJ SOLN
INTRAMUSCULAR | Status: AC
  Filled 2017-05-26: qty 2

## 2017-05-26 MED ORDER — PROPOFOL 500 MG/50ML IV EMUL
INTRAVENOUS | Status: AC
  Filled 2017-05-26: qty 50

## 2017-05-26 MED ORDER — LIDOCAINE HCL (PF) 1 % IJ SOLN
INTRAMUSCULAR | Status: AC
  Administered 2017-05-26: 0.3 mL via INTRADERMAL
  Filled 2017-05-26: qty 2

## 2017-05-26 MED ORDER — SODIUM CHLORIDE 0.9 % IV SOLN
INTRAVENOUS | Status: DC
Start: 2017-05-26 — End: 2017-05-26
  Administered 2017-05-26: 1000 mL via INTRAVENOUS
  Administered 2017-05-26: 08:00:00 via INTRAVENOUS

## 2017-05-26 MED ORDER — PHENYLEPHRINE HCL 10 MG/ML IJ SOLN
INTRAMUSCULAR | Status: AC
  Filled 2017-05-26: qty 1

## 2017-05-26 MED ORDER — MIDAZOLAM HCL 2 MG/2ML IJ SOLN
INTRAMUSCULAR | Status: DC | PRN
  Administered 2017-05-26: 2 mg via INTRAVENOUS

## 2017-05-26 MED ORDER — LIDOCAINE HCL (PF) 2 % IJ SOLN
INTRAMUSCULAR | Status: AC
  Filled 2017-05-26: qty 4

## 2017-05-26 MED ORDER — LIDOCAINE HCL (PF) 1 % IJ SOLN
2.0000 mL | Freq: Once | INTRAMUSCULAR | Status: AC
  Administered 2017-05-26: 0.3 mL via INTRADERMAL

## 2017-05-26 MED ORDER — PROPOFOL 500 MG/50ML IV EMUL
INTRAVENOUS | Status: DC | PRN
  Administered 2017-05-26: 200 ug/kg/min via INTRAVENOUS

## 2017-05-26 NOTE — H&P (Signed)
[name], MD [address] [place] [place], [place] [address] Phone:[phone] Fax : [phone]  Primary Care Physician:  Birdie Sons, MD Primary Gastroenterologist:  Dr. Allen Norris  Pre-Procedure History & Physical: HPI:  Steven Saunders is a 66 y.o. male is here for an colonoscopy.   Past Medical History:  Diagnosis Date  . Atrial fibrillation ([place])   . Hypertension   . Pneumonia 10/13/2016    Past Surgical History:  Procedure Laterality Date  . APPENDECTOMY    . CARDIAC CATHETERIZATION  2007  . HERNIA REPAIR     1950's x 2  . JOINT REPLACEMENT Right 2014  . thumb surgery  1980   had staph infection and had to cut out the area  . TOTAL HIP ARTHROPLASTY      Prior to Admission medications   Medication Sig Start Date End Date Taking? Authorizing Provider  aspirin EC 81 MG tablet Take 1 tablet (81 mg total) by mouth daily. 11/09/12   Evans Lance, MD  carvedilol (COREG) 25 MG tablet TAKE 1 TABLET BY MOUTH TWO TIMES DAILY WITH A MEAL 03/17/17   Birdie Sons, MD  digoxin (LANOXIN) 0.125 MG tablet TAKE 1 TABLET BY MOUTH DAILY 02/25/17   Evans Lance, MD  docusate (COLACE) 60 MG/15ML syrup Take 60 mg by mouth daily.      [provider]  loratadine (CLARITIN) 10 MG tablet Take 10 mg by mouth daily.      [provider]  losartan-hydrochlorothiazide (HYZAAR) 100-25 MG tablet TAKE 1 TABLET BY MOUTH DAILY 12/26/16   Birdie Sons, MD  lovastatin (MEVACOR) 20 MG tablet TAKE ONE TABLET  EACH EVENING 04/21/17   Birdie Sons, MD  Multiple Vitamin (MULTIVITAMIN) tablet Take 1 tablet by mouth daily.      [provider]  Naproxen Sodium (ALEVE PO) Take by mouth as needed (for headaches, pain etc...).    [provider]  tadalafil (CIALIS) 20 MG tablet Take 20 mg by mouth daily as needed.      [provider]  valACYclovir (VALTREX) 1000 MG tablet TAKE TWO TABLETS EVERY 12 HOURS IF NEEDED 04/21/17   Birdie Sons,  MD    Allergies as of 04/21/2017  . (No Known Allergies)    Family History  Problem Relation Age of Onset  . Hypertension Mother   . Heart attack Father   . Melanoma Father     Social History   Social History  . Marital status: Married    Spouse name: N/A  . Number of children: 2  . Years of education: N/A   Occupational History  . Retired     Still drives school bus   Social History Main Topics  . Smoking status: Former Smoker    Packs/day: 2.00    Years: 15.00    Types: Cigarettes    Quit date: 11/12/2009  . Smokeless tobacco: Never Used  . Alcohol use 0.6 - 1.2 oz/week    1 - 2 Cans of beer per week  . Drug use: No  . Sexual activity: Not on file   Other Topics Concern  . Not on file   Social History Narrative  . No narrative on file    Review of Systems: See HPI, otherwise negative ROS  Physical Exam: BP (!) 173/99   Pulse 62   Temp (!) 96.6 F (35.9 C) (Tympanic)   Resp 17   Ht 6' (1.829 m)  Wt 260 lb (117.9 kg)   SpO2 100%   BMI 35.26 kg/m  General:   Alert,  pleasant and cooperative in NAD Head:  Normocephalic and atraumatic. Neck:  Supple; no masses or thyromegaly. Lungs:  Clear throughout to auscultation.    Heart:  Regular rate and rhythm. Abdomen:  Soft, nontender and nondistended. Normal bowel sounds, without guarding, and without rebound.   Neurologic:  Alert and  oriented x4;  grossly normal neurologically.  Impression/Plan: Steven Saunders is here for an colonoscopy to be performed for History of polyps   Risks, benefits, limitations, and alternatives regarding  colonoscopy have been reviewed with the patient.  Questions have been answered.  All parties agreeable.   [name], MD  05/26/2017, 7:56 AM

## 2017-05-26 NOTE — Anesthesia Postprocedure Evaluation (Signed)
Anesthesia Post Note  Patient: Steven Saunders  Procedure(s) Performed: Procedure(s) (LRB): COLONOSCOPY WITH PROPOFOL (N/A)  Patient location during evaluation: Endoscopy Anesthesia Type: General Level of consciousness: awake and alert and oriented Pain management: pain level controlled Vital Signs Assessment: post-procedure vital signs reviewed and stable Respiratory status: spontaneous breathing, nonlabored ventilation and respiratory function stable Cardiovascular status: blood pressure returned to baseline and stable Postop Assessment: no signs of nausea or vomiting Anesthetic complications: no     Last Vitals:  Vitals:   05/26/17 0838 05/26/17 0858  BP: 127/84 126/80  Pulse: 79 80  Resp: 20   Temp:    SpO2: 99%     Last Pain:  Vitals:   05/26/17 0818  TempSrc: Tympanic                 Janaisa Birkland

## 2017-05-26 NOTE — Op Note (Signed)
[facility] Gastroenterology Patient Name: Steven Saunders Procedure Date: 05/26/2017 7:50 AM MRN: [id number] Account #: [id number] Date of Birth: 01/27/1951 Admit Type: Outpatient Age: 66 Room: [facility] ENDO ROOM 4 Gender: Male Note Status: Finalized Procedure:            Colonoscopy Indications:          High risk colon cancer surveillance: Personal history                        of colonic polyps Providers:            [name] MD, MD Referring MD:         Kirstie Peri. Caryn Section, MD (Referring MD) Medicines:            Propofol per Anesthesia Complications:        No immediate complications. Procedure:            Pre-Anesthesia Assessment:                       - Prior to the procedure, a History and Physical was                        performed, and patient medications and allergies were                        reviewed. The patient's tolerance of previous                        anesthesia was also reviewed. The risks and benefits of                        the procedure and the sedation options and risks were                        discussed with the patient. All questions were                        answered, and informed consent was obtained. Prior                        Anticoagulants: The patient has taken no previous                        anticoagulant or antiplatelet agents. ASA Grade                        Assessment: II - A patient with mild systemic disease.                        After reviewing the risks and benefits, the patient was                        deemed in satisfactory condition to undergo the                        procedure.                       After obtaining informed consent, the colonoscope was  passed under direct vision. Throughout the procedure,                        the patient's blood pressure, pulse, and oxygen                        saturations were monitored continuously. The   Colonoscope was introduced through the anus and                        advanced to the the cecum, identified by appendiceal                        orifice and ileocecal valve. The colonoscopy was                        performed without difficulty. The patient tolerated the                        procedure well. The quality of the bowel preparation                        was excellent. Findings:      The perianal and digital rectal examinations were normal.      A 3 mm polyp was found in the transverse colon. The polyp was sessile.       The polyp was removed with a cold biopsy forceps. Resection and       retrieval were complete.      A 4 mm polyp was found in the sigmoid colon. The polyp was sessile. The       polyp was removed with a cold biopsy forceps. Resection and retrieval       were complete.      Non-bleeding internal hemorrhoids were found during retroflexion. The       hemorrhoids were Grade I (internal hemorrhoids that do not prolapse). Impression:           - One 3 mm polyp in the transverse colon, removed with                        a cold biopsy forceps. Resected and retrieved.                       - One 4 mm polyp in the sigmoid colon, removed with a                        cold biopsy forceps. Resected and retrieved.                       - Non-bleeding internal hemorrhoids. Recommendation:       - Await pathology results.                       - Discharge patient to home.                       - Resume previous diet.                       - Continue present medications. Procedure Code(s):    --- Professional ---  45380, Colonoscopy, flexible; with biopsy, single or                        multiple Diagnosis Code(s):    --- Professional ---                       Z86.010, Personal history of colonic polyps                       D12.5, Benign neoplasm of sigmoid colon                       D12.3, Benign neoplasm of transverse colon (hepatic                         flexure or splenic flexure) CPT copyright 2016 American Medical Association. All rights reserved. The codes documented in this report are preliminary and upon coder review may  be revised to meet current compliance requirements. [name] MD, MD 05/26/2017 8:19:16 AM This report has been signed electronically. Number of Addenda: 0 Note Initiated On: 05/26/2017 7:50 AM Scope Withdrawal Time: 0 hours 8 minutes 50 seconds  Total Procedure Duration: 0 hours 11 minutes 12 seconds       [facility]

## 2017-05-26 NOTE — Transfer of Care (Signed)
Immediate Anesthesia Transfer of Care Note  Patient: Gilverto Dileonardo  Procedure(s) Performed: Procedure(s): COLONOSCOPY WITH PROPOFOL (N/A)  Patient Location: Endoscopy Unit  Anesthesia Type:General  Level of Consciousness: drowsy and patient cooperative  Airway & Oxygen Therapy: Patient Spontanous Breathing and Patient connected to nasal cannula oxygen  Post-op Assessment: Report given to RN, Post -op Vital signs reviewed and stable and Patient moving all extremities X 4  Post vital signs: Reviewed and stable  Last Vitals:  Vitals:   05/26/17 0730 05/26/17 0818  BP: (!) 173/99 134/85  Pulse: 62 80  Resp: 17 14  Temp: (!) 35.9 C (!) 35.6 C  SpO2: 100% 97%    Last Pain:  Vitals:   05/26/17 0818  TempSrc: Tympanic         Complications: No apparent anesthesia complications

## 2017-05-26 NOTE — Anesthesia Preprocedure Evaluation (Signed)
Anesthesia Evaluation  Patient identified by MRN, date of birth, ID band Patient awake    Reviewed: Allergy & Precautions, NPO status , Patient's Chart, lab work & pertinent test results  History of Anesthesia Complications Negative for: history of anesthetic complications  Airway Mallampati: II  TM Distance: >3 FB Neck ROM: Full    Dental no notable dental hx.    Pulmonary neg sleep apnea, neg COPD, former smoker,    breath sounds clear to auscultation- rhonchi (-) wheezing      Cardiovascular Exercise Tolerance: Good hypertension, Pt. on medications (-) CAD, (-) Past MI and (-) Cardiac Stents + dysrhythmias Atrial Fibrillation  Rhythm:Regular Rate:Normal - Systolic murmurs and - Diastolic murmurs    Neuro/Psych negative neurological ROS  negative psych ROS   GI/Hepatic negative GI ROS, Neg liver ROS,   Endo/Other  negative endocrine ROSneg diabetes  Renal/GU negative Renal ROS     Musculoskeletal  (+) Arthritis ,   Abdominal (+) + obese,   Peds  Hematology negative hematology ROS (+)   Anesthesia Other Findings Past Medical History: No date: Atrial fibrillation ([facility]) No date: Hypertension 10/13/2016: Pneumonia   Reproductive/Obstetrics                             Anesthesia Physical Anesthesia Plan  ASA: III  Anesthesia Plan: General   Post-op Pain Management:    Induction: Intravenous  PONV Risk Score and Plan: 1 and Propofol infusion  Airway Management Planned: Natural Airway  Additional Equipment:   Intra-op Plan:   Post-operative Plan:   Informed Consent: I have reviewed the patients History and Physical, chart, labs and discussed the procedure including the risks, benefits and alternatives for the proposed anesthesia with the patient or authorized representative who has indicated his/her understanding and acceptance.   Dental advisory given  Plan Discussed with:  CRNA and Anesthesiologist  Anesthesia Plan Comments:         Anesthesia Quick Evaluation

## 2017-05-26 NOTE — Anesthesia Post-op Follow-up Note (Signed)
Anesthesia QCDR form completed.        

## 2017-05-27 ENCOUNTER — Encounter: Payer: Self-pay | Admitting: Gastroenterology

## 2017-05-27 LAB — SURGICAL PATHOLOGY

## 2017-05-28 ENCOUNTER — Encounter: Payer: Self-pay | Admitting: Gastroenterology

## 2017-06-09 ENCOUNTER — Ambulatory Visit: Payer: PPO | Admitting: Urology

## 2017-06-09 ENCOUNTER — Encounter: Payer: Self-pay | Admitting: Urology

## 2017-06-09 VITALS — BP 162/110 | HR 54 | Ht 72.0 in | Wt 257.9 lb

## 2017-06-09 DIAGNOSIS — R972 Elevated prostate specific antigen [PSA]: Secondary | ICD-10-CM | POA: Diagnosis not present

## 2017-06-09 MED ORDER — SILDENAFIL CITRATE 20 MG PO TABS: 40.0000 mg | ORAL_TABLET | Freq: Every day | ORAL | 3 refills | Status: DC | PRN

## 2017-06-09 NOTE — Progress Notes (Signed)
06/09/2017 10:47 AM   Steven Saunders 12/09/1950 [id number]  Referring provider: Birdie Sons, MD [address] [place], [place] [address]  Chief Complaint  Patient presents with  . Elevated PSA    HPI: 66 year old male who is seen today for further evaluation and management of an elevated PSA. The patient has had no progression of his urinary tract symptoms over the last 6 months. He denies any hematuria or dysuria. He has no history of recurrent urinary tract infections or prostatitis. He has no history of kidney stones or other GU issues.  The patient has no family history significant for prostate cancer, breast cancer, lung cancer, or colon cancer.  SHIM: 9 (responds reasonably well to medications) IPSS: 9, QoL1  PSA history: 7.3 (11% Free)  on 05/19/17  5.3 on 04/09/17 4.1 on 01/08/15   PMH: Past Medical History:  Diagnosis Date  . Atrial fibrillation ([place])   . Hypertension   . Pneumonia 10/13/2016    Surgical History: Past Surgical History:  Procedure Laterality Date  . APPENDECTOMY    . CARDIAC CATHETERIZATION  2007  . COLONOSCOPY WITH PROPOFOL N/A 05/26/2017   Procedure: COLONOSCOPY WITH PROPOFOL;  Surgeon: [name], MD;  Location: [facility] ENDOSCOPY;  Service: Endoscopy;  Laterality: N/A;  . HERNIA REPAIR     1950's x 2  . JOINT REPLACEMENT Right 2014  . thumb surgery  1980   had staph infection and had to cut out the area  . TOTAL HIP ARTHROPLASTY      Home Medications:  Allergies as of 06/09/2017   No Known Allergies     Medication List       Accurate as of 06/09/17 10:47 AM. Always use your most recent med list.          ALEVE PO Take by mouth as needed (for headaches, pain etc...).   aspirin EC 81 MG tablet Take 1 tablet (81 mg total) by mouth daily.   carvedilol 25 MG tablet Commonly known as:  COREG TAKE 1 TABLET BY MOUTH TWO TIMES DAILY WITH A MEAL   digoxin 0.125 MG tablet Commonly known as:  LANOXIN TAKE 1 TABLET  BY MOUTH DAILY   docusate 60 MG/15ML syrup Commonly known as:  COLACE Take 60 mg by mouth daily.   loratadine 10 MG tablet Commonly known as:  CLARITIN Take 10 mg by mouth daily.   losartan-hydrochlorothiazide 100-25 MG tablet Commonly known as:  HYZAAR TAKE 1 TABLET BY MOUTH DAILY   lovastatin 20 MG tablet Commonly known as:  MEVACOR TAKE ONE TABLET  EACH EVENING   multivitamin tablet Take 1 tablet by mouth daily.   tadalafil 20 MG tablet Commonly known as:  CIALIS Take 20 mg by mouth daily as needed.   valACYclovir 1000 MG tablet Commonly known as:  VALTREX TAKE TWO TABLETS EVERY 12 HOURS IF NEEDED       Allergies: No Known Allergies  Family History: Family History  Problem Relation Age of Onset  . Hypertension Mother   . Heart attack Father   . Melanoma Father   . Prostate cancer Neg Hx   . Bladder Cancer Neg Hx   . Kidney cancer Neg Hx     Social History:  reports that he quit smoking about 7 years ago. His smoking use included Cigarettes. He has a 30.00 pack-year smoking history. He has never used smokeless tobacco. He reports that he drinks about 0.6 - 1.2 oz of alcohol per week .  He reports that he does not use drugs.  ROS: UROLOGY Frequent Urination?: No Hard to postpone urination?: No Burning/pain with urination?: No Get up at night to urinate?: No Leakage of urine?: No Urine stream starts and stops?: No Trouble starting stream?: No Do you have to strain to urinate?: No Blood in urine?: No Urinary tract infection?: No Sexually transmitted disease?: No Injury to kidneys or bladder?: No Painful intercourse?: No Weak stream?: No Erection problems?: Yes Penile pain?: No  Gastrointestinal Nausea?: No Vomiting?: No Indigestion/heartburn?: No Diarrhea?: No Constipation?: No  Constitutional Fever: No Night sweats?: No Weight loss?: No Fatigue?: No  Skin Skin rash/lesions?: No Itching?: No  Eyes Blurred vision?: No Double vision?:  No  Ears/Nose/Throat Sore throat?: No Sinus problems?: No  Hematologic/Lymphatic Swollen glands?: No Easy bruising?: No  Cardiovascular Leg swelling?: No Chest pain?: No  Respiratory Cough?: No Shortness of breath?: No  Endocrine Excessive thirst?: No  Musculoskeletal Back pain?: No Joint pain?: No  Neurological Headaches?: No Dizziness?: No  Psychologic Depression?: No Anxiety?: No  Physical Exam: BP (!) 162/110 (BP Location: Right Arm, Patient Position: Sitting, Cuff Size: Large)   Pulse (!) 54   Ht 6' (1.829 m)   Wt 117 kg (257 lb 14.4 oz)   BMI 34.98 kg/m   Constitutional:  Alert and oriented, No acute distress. HEENT:  AT, moist mucus membranes.  Trachea midline, no masses. Cardiovascular: No clubbing, cyanosis, or edema. Respiratory: Normal respiratory effort, no increased work of breathing. GI: Abdomen is soft, nontender, nondistended, no abdominal masses GU: No CVA tenderness.  DRE: Smooth, symmetric, plus 2 in size with no nodules. Skin: No rashes, bruises or suspicious lesions. Lymph: No cervical or inguinal adenopathy. Neurologic: Grossly intact, no focal deficits, moving all 4 extremities. Psychiatric: Normal mood and affect.  Laboratory Data: Lab Results  Component Value Date   WBC 10.9 (H) 11/15/2016   HGB 12.4 (L) 11/15/2016   HCT 34.5 (L) 11/15/2016   MCV 93.3 11/15/2016   PLT 142 (L) 11/15/2016    Lab Results  Component Value Date   CREATININE 0.69 (L) 04/09/2017    Lab Results  Component Value Date   PSA1 5.3 (H) 04/09/2017    No results found for: TESTOSTERONE  Lab Results  Component Value Date   HGBA1C 5.6 05/19/2017    Urinalysis Lab Results  Component Value Date   APPEARANCEUR Clear 06/07/2013   LEUKOCYTESUR 2+ 06/07/2013   PROTEINUR Negative 06/07/2013   GLUCOSEU Negative 06/07/2013   RBCU 11 /HPF 06/07/2013   BILIRUBINUR Negative 06/07/2013   NITRITE Negative 06/07/2013    Lab Results  Component  Value Date   BACTERIA TRACE 06/07/2013    Pertinent Imaging: None No results found for this or any previous visit. No results found for this or any previous visit. No results found for this or any previous visit. No results found for this or any previous visit. No results found for this or any previous visit. No results found for this or any previous visit. No results found for this or any previous visit. No results found for this or any previous visit.  Assessment & Plan:  Elevated PSA with decrease in free PSA concerning for malignancy. The patient's trend is certainly been arising.  1. Elevated PSA Had a long discussion with the patient about an elevated PSA with his current trend and percent free PSA. I expressed my concern for him having prostate cancer. I discussed with him the prostate cancer risk calculator based on  the prostate cancer prevention trial and explained to him that he had around a 20% risk of high-grade prostate cancer. I then described for him the prostate biopsies in the risk associated with it, predominantly infection. I told him that with the biopsy he could expect to have some blood in his urine and blood in his stool for a few days, and that he would have blood in his ejaculate for up to a month. We will try to get his prostate biopsy scheduled in the next several weeks.  - Urinalysis, Complete   No Follow-up on file.  Ardis Hughs, [facility] [address] [place], [place] [address] [phone]

## 2017-06-09 NOTE — Addendum Note (Signed)
Addended by: Kyra Manges on: 06/09/2017 11:26 AM   Modules accepted: Orders

## 2017-06-10 ENCOUNTER — Telehealth: Payer: Self-pay | Admitting: *Deleted

## 2017-06-10 NOTE — Telephone Encounter (Signed)
   [facility] Pre-operative Risk Assessment    Request for surgical clearance:  1. What type of surgery is being performed? Prostate Biopsy  2. When is this surgery scheduled? 11.01.2018  3. Are there any medications that need to be held prior to surgery and how long? Asprin 81 for 7 days prior to procedure :Cardiac Clearance:  4. Practice name and name of physician performing surgery? [facility] .N/A Awaiting clearance  5. What is your office phone and fax number? Ph: [phone] FX: [phone]  2. Anesthesia type (None, local, MAC, general) ? N/A   Saunders, Steven R 06/10/2017, 1:51 PM  _________________________________________________________________   (provider comments below)

## 2017-06-15 NOTE — Telephone Encounter (Signed)
Left voicemail for patient to call back to try to schedule pre op clearance OV with EP APP on same day Dr. Lovena Le is in office.

## 2017-06-15 NOTE — Telephone Encounter (Signed)
    Chart reviewed as part of pre-operative protocol coverage.   Steven Saunders was last seen on 12/23/16 by Dr. Lovena Le. Per note, Patient has continued to declined systemic anticoagulation. The patient will need appointment with EP APP while Dr. Lovena Le is in clinic or afib clinic.    [place], PA 06/15/2017, 4:24 PM

## 2017-06-16 ENCOUNTER — Telehealth: Payer: Self-pay | Admitting: Internal Medicine

## 2017-06-16 NOTE — Telephone Encounter (Signed)
Earvin Hansen J at 06/16/2017 8:24 AM   Status: Signed    Follow up     Pt returned [place] call - he was set up an appt with Amber on 10/26 840a for his pre-op appt

## 2017-06-16 NOTE — Telephone Encounter (Signed)
Follow up     Pt returned Ch[place]ie call - he was set up an appt with Amber on 10/26 840a for his pre-op appt

## 2017-06-16 NOTE — Telephone Encounter (Signed)
Note added to patient's encounter for surgical clearance

## 2017-06-24 NOTE — Progress Notes (Signed)
Electrophysiology Office Note Date: 06/26/2017  ID:  Steven Saunders, DOB September 07, 1950, MRN [id number]  PCP: Birdie Sons, MD Electrophysiologist: Lovena Le  CC: pre-op clearance  Steven Saunders is a 66 y.o. male seen today for Dr Lovena Le.  He presents today for routine electrophysiology followup.  Since last being seen in our clinic, the patient reports doing very well. 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  He does not have chest pain or shortness of breath with activity.  He is functionally limited by knee pain. His PSA was recently elevated and he is scheduled for prostate biopsy next week.  He denies chest pain, palpitations, dyspnea, PND, orthopnea, nausea, vomiting, dizziness, syncope, edema, weight gain, or early satiety.  Past Medical History:  Diagnosis Date  . Hypertension   . Permanent atrial fibrillation [facility])    Past Surgical History:  Procedure Laterality Date  . APPENDECTOMY    . CARDIAC CATHETERIZATION  2007  . COLONOSCOPY WITH PROPOFOL N/A 05/26/2017   Procedure: COLONOSCOPY WITH PROPOFOL;  Surgeon: [name], MD;  Location: [facility] ENDOSCOPY;  Service: Endoscopy;  Laterality: N/A;  . HERNIA REPAIR     1950's x 2  . JOINT REPLACEMENT Right 2014  . thumb surgery  1980   had staph infection and had to cut out the area  . TOTAL HIP ARTHROPLASTY      Current Outpatient Prescriptions  Medication Sig Dispense Refill  . aspirin EC 81 MG tablet Take 1 tablet (81 mg total) by mouth daily. 90 tablet 3  . carvedilol (COREG) 25 MG tablet TAKE 1 TABLET BY MOUTH TWO TIMES DAILY WITH A MEAL 180 tablet 3  . digoxin (LANOXIN) 0.125 MG tablet TAKE 1 TABLET BY MOUTH DAILY 90 tablet 2  . docusate (COLACE) 60 MG/15ML syrup Take 60 mg by mouth daily.      Marland Kitchen loratadine (CLARITIN) 10 MG tablet Take 10 mg by mouth daily.      Marland Kitchen losartan-hydrochlorothiazide (HYZAAR) 100-25 MG tablet TAKE 1 TABLET BY MOUTH DAILY 30 tablet 12  . lovastatin (MEVACOR)  20 MG tablet TAKE ONE TABLET  EACH EVENING 30 tablet 12  . Multiple Vitamin (MULTIVITAMIN) tablet Take 1 tablet by mouth daily.      . Naproxen Sodium (ALEVE PO) Take by mouth as needed (for headaches, pain etc...).    Marland Kitchen sildenafil (REVATIO) 20 MG tablet Take 2-5 tablets (40-100 mg total) by mouth daily as needed. 50 tablet 3  . valACYclovir (VALTREX) 1000 MG tablet TAKE TWO TABLETS EVERY 12 HOURS IF NEEDED 20 tablet 5   No current facility-administered medications for this visit.     Allergies:   Patient has no known allergies.   Social History: Social History   Social History  . Marital status: Married    Spouse name: N/A  . Number of children: 2  . Years of education: N/A   Occupational History  . Retired     Still drives school bus   Social History Main Topics  . Smoking status: Former Smoker    Packs/day: 2.00    Years: 15.00    Types: Cigarettes    Quit date: 11/12/2009  . Smokeless tobacco: Never Used  . Alcohol use 0.6 - 1.2 oz/week    1 - 2 Cans of beer per week  . Drug use: No  . Sexual activity: Not on file   Other Topics Concern  . Not on file   Social History  Narrative  . No narrative on file    Family History: Family History  Problem Relation Age of Onset  . Hypertension Mother   . Heart attack Father   . Melanoma Father   . Prostate cancer Neg Hx   . Bladder Cancer Neg Hx   . Kidney cancer Neg Hx     Review of Systems: All other systems reviewed and are otherwise negative except as noted above.   Physical Exam: VS:  BP 138/84   Pulse 62   Ht 6' (1.829 m)   Wt 257 lb (116.6 kg)   BMI 34.86 kg/m  , BMI Body mass index is 34.86 kg/m. Wt Readings from Last 3 Encounters:  06/26/17 257 lb (116.6 kg)  06/09/17 257 lb 14.4 oz (117 kg)  05/26/17 260 lb (117.9 kg)    GEN- The patient is well appearing, alert and oriented x 3 today.   HEENT: normocephalic, atraumatic; sclera clear, conjunctiva pink; hearing intact; oropharynx clear; neck  supple, no JVP Lymph- no cervical lymphadenopathy Lungs- Clear to ausculation bilaterally, normal work of breathing.  No wheezes, rales, rhonchi Heart- Regular rate and rhythm, no murmurs, rubs or gallops, PMI not laterally displaced GI- soft, non-tender, non-distended, bowel sounds present, no hepatosplenomegaly Extremities- no clubbing, cyanosis, or edema; DP/PT/radial pulses 2+ bilaterally MS- no significant deformity or atrophy Skin- warm and dry, no rash or lesion  Psych- euthymic mood, full affect Neuro- strength and sensation are intact   EKG:  EKG is ordered today. The ekg ordered today shows atrial fibrillation, V rate 62  Recent Labs: 11/15/2016: Hemoglobin 12.4; Platelets 142 04/09/2017: ALT 28; BUN 10; Creatinine, Ser 0.69; Potassium 4.8; Sodium 133    Other studies Reviewed: Additional studies/ records that were reviewed today include: Dr Tanna Furry office notes  Assessment and Plan:  1.  Permament atrial fibrillation V rates are controlled CHADS2VASC is at least 2.  He continues to respectfully decline [place]. We discussed again today   2.  HTN Stable No change required today  3.  Obesity Body mass index is 34.86 kg/m. Weight loss encouraged  4.  Pre-op clearance  Functional METS capacity is 7 Perioperative risk of major cardiac event is 0.4% per RCRI Pt is at low risk for cardiac events during surgery He is currently holding ASA per instructions from urology He is not on [facility] for AF    Current medicines are reviewed at length with the patient today.   The patient does not have concerns regarding his medicines.  The following changes were made today:  none  Labs/ tests ordered today include: none Orders Placed This Encounter  Procedures  . EKG 12-Lead     Disposition:   Follow up with Dr Lovena Le 1 year   Signed, Chanetta Marshall, NP 06/26/2017 9:27 AM   [address] [place] [address] [phone]  (office) [phone] (fax)

## 2017-06-26 ENCOUNTER — Encounter: Payer: Self-pay | Admitting: Nurse Practitioner

## 2017-06-26 ENCOUNTER — Ambulatory Visit (INDEPENDENT_AMBULATORY_CARE_PROVIDER_SITE_OTHER): Payer: PPO | Admitting: Nurse Practitioner

## 2017-06-26 VITALS — BP 138/84 | HR 62 | Ht 72.0 in | Wt 257.0 lb

## 2017-06-26 DIAGNOSIS — I4821 Permanent atrial fibrillation: Secondary | ICD-10-CM

## 2017-06-26 DIAGNOSIS — I482 Chronic atrial fibrillation: Secondary | ICD-10-CM | POA: Diagnosis not present

## 2017-06-26 NOTE — Patient Instructions (Signed)
Medication Instructions:   Your physician recommends that you continue on your current medications as directed. Please refer to the Current Medication list given to you today.   If you need a refill on your cardiac medications before your next appointment, please call your pharmacy.  Labwork: NONE ORDERED  TODAY     Testing/Procedures: NONE ORDERED  TODAY    Follow-Up:  Your physician wants you to follow-up in: ONE YEAR WITH  TAYLOR You will receive a reminder letter in the mail two months in advance. If you don't receive a letter, please call our office to schedule the follow-up appointment.     Any Other Special Instructions Will Be Listed Below (If Applicable).                                                                                                                                                   

## 2017-07-02 ENCOUNTER — Ambulatory Visit (INDEPENDENT_AMBULATORY_CARE_PROVIDER_SITE_OTHER): Payer: PPO | Admitting: Urology

## 2017-07-02 ENCOUNTER — Other Ambulatory Visit: Payer: Self-pay | Admitting: Urology

## 2017-07-02 ENCOUNTER — Encounter: Payer: Self-pay | Admitting: Urology

## 2017-07-02 VITALS — BP 171/105 | HR 78 | Ht 72.0 in | Wt 256.2 lb

## 2017-07-02 DIAGNOSIS — C61 Malignant neoplasm of prostate: Secondary | ICD-10-CM | POA: Diagnosis not present

## 2017-07-02 DIAGNOSIS — R972 Elevated prostate specific antigen [PSA]: Secondary | ICD-10-CM | POA: Diagnosis not present

## 2017-07-02 DIAGNOSIS — N4231 Prostatic intraepithelial neoplasia: Secondary | ICD-10-CM | POA: Diagnosis not present

## 2017-07-02 MED ORDER — LEVOFLOXACIN 500 MG PO TABS
500.0000 mg | ORAL_TABLET | Freq: Once | ORAL | Status: AC
  Administered 2017-07-02: 500 mg via ORAL

## 2017-07-02 MED ORDER — LIDOCAINE HCL 2 % EX GEL
1.0000 "application " | Freq: Once | CUTANEOUS | Status: AC
  Administered 2017-07-02: 1 via URETHRAL

## 2017-07-02 MED ORDER — GENTAMICIN SULFATE 40 MG/ML IJ SOLN
80.0000 mg | Freq: Once | INTRAMUSCULAR | Status: AC
  Administered 2017-07-02: 80 mg via INTRAMUSCULAR

## 2017-07-02 NOTE — Progress Notes (Signed)
Prostate Biopsy Procedure   Informed consent was obtained after discussing risks/benefits of the procedure.  A time out was performed to ensure correct patient identity.  Pre-Procedure: - Last PSA Level: 7.3 in September 2018  - Gentamicin given prophylactically - Levaquin 500 mg administered PO -Transrectal Ultrasound performed revealing a 51 gm prostate -No significant hypoechoic or median lobe noted  Procedure: - Prostate block performed using 10 cc 1% lidocaine and biopsies taken from sextant areas, a total of 12 under ultrasound guidance.  Post-Procedure: - Patient tolerated the procedure well - He was counseled to seek immediate medical attention if experiences any severe pain, significant bleeding, or fevers - Return in one week to discuss biopsy results

## 2017-07-08 ENCOUNTER — Other Ambulatory Visit: Payer: Self-pay | Admitting: Urology

## 2017-07-08 LAB — PATHOLOGY REPORT

## 2017-07-16 ENCOUNTER — Encounter: Payer: Self-pay | Admitting: Urology

## 2017-07-16 ENCOUNTER — Ambulatory Visit: Payer: PPO | Admitting: Urology

## 2017-07-16 VITALS — BP 178/98 | HR 74 | Ht 72.0 in | Wt 258.0 lb

## 2017-07-16 DIAGNOSIS — C61 Malignant neoplasm of prostate: Secondary | ICD-10-CM

## 2017-07-16 NOTE — Progress Notes (Signed)
07/16/2017 12:07 PM   Steven Saunders 08/20/1951 [id number]  Referring provider: Birdie Sons, MD [address] [place], [place] [address]  Chief Complaint  Patient presents with  . Follow-up    biopsy results    HPI: The patient is a 66 year old gentleman presents today to discuss his prostate biopsy results.  It did show Gleason 3+3 = 6 in 8 of 12 cores.  No previous history of prostate cancer.  Pathology: Bilateral Gleason 3+3 = 6 and 8 of 12 cores.  Percentages ranged from 1% to 37%. PSA at diagnosis: 7.3.  DRE benign.  PSA history: 7.3 (11% Free)  on 05/19/17  5.3 on 04/09/17 4.1 on 01/08/15       PMH: Past Medical History:  Diagnosis Date  . Hypertension   . Permanent atrial fibrillation [facility])     Surgical History: Past Surgical History:  Procedure Laterality Date  . APPENDECTOMY    . CARDIAC CATHETERIZATION  2007  . COLONOSCOPY WITH PROPOFOL N/A 05/26/2017   Procedure: COLONOSCOPY WITH PROPOFOL;  Surgeon: [name], MD;  Location: [facility] ENDOSCOPY;  Service: Endoscopy;  Laterality: N/A;  . HERNIA REPAIR     1950's x 2  . JOINT REPLACEMENT Right 2014  . thumb surgery  1980   had staph infection and had to cut out the area  . TOTAL HIP ARTHROPLASTY      Home Medications:  Allergies as of 07/16/2017   No Known Allergies     Medication List        Accurate as of 07/16/17 12:07 PM. Always use your most recent med list.          ALEVE PO Take by mouth as needed (for headaches, pain etc...).   aspirin EC 81 MG tablet Take 1 tablet (81 mg total) by mouth daily.   carvedilol 25 MG tablet Commonly known as:  COREG TAKE 1 TABLET BY MOUTH TWO TIMES DAILY WITH A MEAL   digoxin 0.125 MG tablet Commonly known as:  LANOXIN TAKE 1 TABLET BY MOUTH DAILY   docusate 60 MG/15ML syrup Commonly known as:  COLACE Take 60 mg by mouth daily.   loratadine 10 MG tablet Commonly known as:  CLARITIN Take 10 mg by mouth daily.     losartan-hydrochlorothiazide 100-25 MG tablet Commonly known as:  HYZAAR TAKE 1 TABLET BY MOUTH DAILY   lovastatin 20 MG tablet Commonly known as:  MEVACOR TAKE ONE TABLET  EACH EVENING   multivitamin tablet Take 1 tablet by mouth daily.   sildenafil 20 MG tablet Commonly known as:  REVATIO Take 2-5 tablets (40-100 mg total) by mouth daily as needed.   valACYclovir 1000 MG tablet Commonly known as:  VALTREX TAKE TWO TABLETS EVERY 12 HOURS IF NEEDED       Allergies: No Known Allergies  Family History: Family History  Problem Relation Age of Onset  . Hypertension Mother   . Heart attack Father   . Melanoma Father   . Prostate cancer Neg Hx   . Bladder Cancer Neg Hx   . Kidney cancer Neg Hx     Social History:  reports that he quit smoking about 7 years ago. His smoking use included cigarettes. He has a 30.00 pack-year smoking history. he has never used smokeless tobacco. He reports that he drinks about 0.6 - 1.2 oz of alcohol per week. He reports that he does not use drugs.  ROS: UROLOGY Frequent Urination?: No Hard to postpone urination?: No  Burning/pain with urination?: No Get up at night to urinate?: No Leakage of urine?: No Urine stream starts and stops?: No Trouble starting stream?: No Do you have to strain to urinate?: No Blood in urine?: Yes Urinary tract infection?: No Sexually transmitted disease?: No Injury to kidneys or bladder?: No Painful intercourse?: No Weak stream?: No Erection problems?: Yes Penile pain?: No  Gastrointestinal Nausea?: No Vomiting?: No Indigestion/heartburn?: No Diarrhea?: No Constipation?: No  Constitutional Fever: No Night sweats?: No Weight loss?: No Fatigue?: No  Skin Skin rash/lesions?: No Itching?: No  Eyes Blurred vision?: No Double vision?: No  Ears/Nose/Throat Sore throat?: No Sinus problems?: No  Hematologic/Lymphatic Swollen glands?: No Easy bruising?: No  Cardiovascular Leg swelling?:  No Chest pain?: No  Respiratory Cough?: No Shortness of breath?: No  Endocrine Excessive thirst?: No  Musculoskeletal Back pain?: No Joint pain?: No  Neurological Headaches?: No Dizziness?: No  Psychologic Depression?: No Anxiety?: No  Physical Exam: BP (!) 178/98 (BP Location: Right Arm, Patient Position: Sitting, Cuff Size: Normal)   Pulse 74   Ht 6' (1.829 m)   Wt 258 lb (117 kg)   BMI 34.99 kg/m   Constitutional:  Alert and oriented, No acute distress. HEENT: [place] AT, moist mucus membranes.  Trachea midline, no masses. Cardiovascular: No clubbing, cyanosis, or edema. Respiratory: Normal respiratory effort, no increased work of breathing. GI: Abdomen is soft, nontender, nondistended, no abdominal masses GU: No CVA tenderness.  Skin: No rashes, bruises or suspicious lesions. Lymph: No cervical or inguinal adenopathy. Neurologic: Grossly intact, no focal deficits, moving all 4 extremities. Psychiatric: Normal mood and affect.  Laboratory Data: Lab Results  Component Value Date   WBC 10.9 (H) 11/15/2016   HGB 12.4 (L) 11/15/2016   HCT 34.5 (L) 11/15/2016   MCV 93.3 11/15/2016   PLT 142 (L) 11/15/2016    Lab Results  Component Value Date   CREATININE 0.69 (L) 04/09/2017    Lab Results  Component Value Date   PSA 4.1 01/08/2015    No results found for: TESTOSTERONE  Lab Results  Component Value Date   HGBA1C 5.6 05/19/2017    Urinalysis    Component Value Date/Time   COLORURINE Yellow 06/07/2013 2156   APPEARANCEUR Clear 06/07/2013 2156   LABSPEC 1.016 06/07/2013 2156   PHURINE 6.0 06/07/2013 2156   GLUCOSEU Negative 06/07/2013 2156   HGBUR 1+ 06/07/2013 2156   BILIRUBINUR Negative 06/07/2013 2156   KETONESUR Negative 06/07/2013 2156   PROTEINUR Negative 06/07/2013 2156   NITRITE Negative 06/07/2013 2156   LEUKOCYTESUR 2+ 06/07/2013 2156    Assessment & Plan:    1.  Low risk prostate cancer I discussed the patient is new diagnosis of  low risk prostate cancer.  We discussed treatment options.  We did focus on the recommendation of active surveillance for his disease volume.  We discussed that this involves active surveillance of his disease process with repeat biopsies and frequent PSA and rectal exams.  The goal is to not over treat prostate cancer but defined any more aggressive disease and treat for care if and when this occurs.  He understands that he will need to repeat his prostate biopsy in 3-6 months.  All questions were answered regarding this.  We also discussed his other options which include robotic prostatectomy, radiation therapy, watchful waiting, cryotherapy.  I discussed each of these treatment options in great detail along with their known side effects.  He was also given the 100 questions of prostate cancer booklet.  He will follow-up in 3-6 months for repeat prostate biopsy as part of the beginning of his active surveillance protocol.  Return for prostate bx in 3-6 months per pt preference.  Nickie Retort, MD  [facility] [address] [place], [place] [address] [phone]

## 2017-11-16 ENCOUNTER — Telehealth: Payer: Self-pay | Admitting: Family Medicine

## 2017-11-16 NOTE — Telephone Encounter (Signed)
[facility] is calling requesting a refill on the following medications  digoxin (LANOXIN) 0.125 MG tablet

## 2017-11-20 ENCOUNTER — Other Ambulatory Visit: Payer: Self-pay

## 2017-11-20 MED ORDER — DIGOXIN 125 MCG PO TABS: 125.0000 ug | ORAL_TABLET | Freq: Every day | ORAL | 0 refills | Status: DC

## 2017-12-03 ENCOUNTER — Other Ambulatory Visit: Payer: Self-pay

## 2017-12-18 ENCOUNTER — Ambulatory Visit: Payer: Self-pay

## 2018-01-20 ENCOUNTER — Other Ambulatory Visit: Payer: Self-pay | Admitting: Family Medicine

## 2018-02-19 ENCOUNTER — Other Ambulatory Visit: Payer: Self-pay | Admitting: Family Medicine

## 2018-02-19 NOTE — Telephone Encounter (Signed)
Steven Saunders faxed a refill request for the following medication. Thanks CC  digoxin (LANOXIN) 0.125 MG tablet

## 2018-02-20 MED ORDER — DIGOXIN 125 MCG PO TABS: 125.0000 ug | ORAL_TABLET | Freq: Every day | ORAL | 0 refills | Status: DC

## 2018-03-16 ENCOUNTER — Ambulatory Visit (INDEPENDENT_AMBULATORY_CARE_PROVIDER_SITE_OTHER): Payer: PPO

## 2018-03-16 VITALS — BP 136/82 | HR 64 | Temp 98.1°F | Ht 72.0 in | Wt 260.4 lb

## 2018-03-16 DIAGNOSIS — Z0000 Encounter for general adult medical examination without abnormal findings: Secondary | ICD-10-CM | POA: Diagnosis not present

## 2018-03-16 NOTE — Patient Instructions (Addendum)
[name] , Thank you for taking time to come for your Medicare Wellness Visit. I appreciate your ongoing commitment to your health goals. Please review the following plan we discussed and let me know if I can assist you in the future.   Screening recommendations/referrals: Colonoscopy: Up to date Recommended yearly ophthalmology/optometry visit for glaucoma screening and checkup Recommended yearly dental visit for hygiene and checkup  Vaccinations: Influenza vaccine: Up to date Pneumococcal vaccine: Up to date Tdap vaccine: Up to date Shingles vaccine: Pt declines today.     Advanced directives: Already on file.   Conditions/risks identified: Obesity- recommend current diet plan of cutting out sugars and avoiding bad carbohydrates.   Next appointment: 03/22/18 @ 9 AM with Dr Steven Saunders. Pt declined scheduling AWV for 2020 at this time.  Preventive Care 26 Years and Older, Male Preventive care refers to lifestyle choices and visits with your health care provider that can promote health and wellness. What does preventive care include?  A yearly physical exam. This is also called an annual well check.  Dental exams once or twice a year.  Routine eye exams. Ask your health care provider how often you should have your eyes checked.  Personal lifestyle choices, including:  Daily care of your teeth and gums.  Regular physical activity.  Eating a healthy diet.  Avoiding tobacco and drug use.  Limiting alcohol use.  Practicing safe sex.  Taking low doses of aspirin every day.  Taking vitamin and mineral supplements as recommended by your health care provider. What happens during an annual well check? The services and screenings done by your health care provider during your annual well check will depend on your age, overall health, lifestyle risk factors, and family history of disease. Counseling  Your health care provider may ask you questions about your:  Alcohol  use.  Tobacco use.  Drug use.  Emotional well-being.  Home and relationship well-being.  Sexual activity.  Eating habits.  History of falls.  Memory and ability to understand (cognition).  Work and work Statistician. Screening  You may have the following tests or measurements:  Height, weight, and BMI.  Blood pressure.  Lipid and cholesterol levels. These may be checked every 5 years, or more frequently if you are over 71 years old.  Skin check.  Lung cancer screening. You may have this screening every year starting at age 41 if you have a 30-pack-year history of smoking and currently smoke or have quit within the past 15 years.  Fecal occult blood test (FOBT) of the stool. You may have this test every year starting at age 24.  Flexible sigmoidoscopy or colonoscopy. You may have a sigmoidoscopy every 5 years or a colonoscopy every 10 years starting at age 73.  Prostate cancer screening. Recommendations will vary depending on your family history and other risks.  Hepatitis C blood test.  Hepatitis B blood test.  Sexually transmitted disease (STD) testing.  Diabetes screening. This is done by checking your blood sugar (glucose) after you have not eaten for a while (fasting). You may have this done every 1-3 years.  Abdominal aortic aneurysm (AAA) screening. You may need this if you are a current or former smoker.  Osteoporosis. You may be screened starting at age 45 if you are at high risk. Talk with your health care provider about your test results, treatment options, and if necessary, the need for more tests. Vaccines  Your health care provider may recommend certain vaccines, such as:  Influenza vaccine. This is recommended every year.  Tetanus, diphtheria, and acellular pertussis (Tdap, Td) vaccine. You may need a Td booster every 10 years.  Zoster vaccine. You may need this after age 19.  Pneumococcal 13-valent conjugate (PCV13) vaccine. One dose is  recommended after age 32.  Pneumococcal polysaccharide (PPSV23) vaccine. One dose is recommended after age 51. Talk to your health care provider about which screenings and vaccines you need and how often you need them. This information is not intended to replace advice given to you by your health care provider. Make sure you discuss any questions you have with your health care provider. Document Released: 09/14/2015 Document Revised: 05/07/2016 Document Reviewed: 06/19/2015 Elsevier Interactive Patient Education  2017 [place] Prevention in the Home Falls can cause injuries. They can happen to people of all ages. There are many things you can do to make your home safe and to help prevent falls. What can I do on the outside of my home?  Regularly fix the edges of walkways and driveways and fix any cracks.  Remove anything that might make you trip as you walk through a door, such as a raised step or threshold.  Trim any bushes or trees on the path to your home.  Use bright outdoor lighting.  Clear any walking paths of anything that might make someone trip, such as rocks or tools.  Regularly check to see if handrails are loose or broken. Make sure that both sides of any steps have handrails.  Any raised decks and porches should have guardrails on the edges.  Have any leaves, snow, or ice cleared regularly.  Use sand or salt on walking paths during winter.  Clean up any spills in your garage right away. This includes oil or grease spills. What can I do in the bathroom?  Use night lights.  Install grab bars by the toilet and in the tub and shower. Do not use towel bars as grab bars.  Use non-skid mats or decals in the tub or shower.  If you need to sit down in the shower, use a plastic, non-slip stool.  Keep the floor dry. Clean up any water that spills on the floor as soon as it happens.  Remove soap buildup in the tub or shower regularly.  Attach bath mats  securely with double-sided non-slip rug tape.  Do not have throw rugs and other things on the floor that can make you trip. What can I do in the bedroom?  Use night lights.  Make sure that you have a light by your bed that is easy to reach.  Do not use any sheets or blankets that are too big for your bed. They should not hang down onto the floor.  Have a firm chair that has side arms. You can use this for support while you get dressed.  Do not have throw rugs and other things on the floor that can make you trip. What can I do in the kitchen?  Clean up any spills right away.  Avoid walking on wet floors.  Keep items that you use a lot in easy-to-reach places.  If you need to reach something above you, use a strong step stool that has a grab bar.  Keep electrical cords out of the way.  Do not use floor polish or wax that makes floors slippery. If you must use wax, use non-skid floor wax.  Do not have throw rugs and other things on the floor that  can make you trip. What can I do with my stairs?  Do not leave any items on the stairs.  Make sure that there are handrails on both sides of the stairs and use them. Fix handrails that are broken or loose. Make sure that handrails are as long as the stairways.  Check any carpeting to make sure that it is firmly attached to the stairs. Fix any carpet that is loose or worn.  Avoid having throw rugs at the top or bottom of the stairs. If you do have throw rugs, attach them to the floor with carpet tape.  Make sure that you have a light switch at the top of the stairs and the bottom of the stairs. If you do not have them, ask someone to add them for you. What else can I do to help prevent falls?  Wear shoes that:  Do not have high heels.  Have rubber bottoms.  Are comfortable and fit you well.  Are closed at the toe. Do not wear sandals.  If you use a stepladder:  Make sure that it is fully opened. Do not climb a closed  stepladder.  Make sure that both sides of the stepladder are locked into place.  Ask someone to hold it for you, if possible.  Clearly mark and make sure that you can see:  Any grab bars or handrails.  First and last steps.  Where the edge of each step is.  Use tools that help you move around (mobility aids) if they are needed. These include:  Canes.  Walkers.  Scooters.  Crutches.  Turn on the lights when you go into a dark area. Replace any light bulbs as soon as they burn out.  Set up your furniture so you have a clear path. Avoid moving your furniture around.  If any of your floors are uneven, fix them.  If there are any pets around you, be aware of where they are.  Review your medicines with your doctor. Some medicines can make you feel dizzy. This can increase your chance of falling. Ask your doctor what other things that you can do to help prevent falls. This information is not intended to replace advice given to you by your health care provider. Make sure you discuss any questions you have with your health care provider. Document Released: 06/14/2009 Document Revised: 01/24/2016 Document Reviewed: 09/22/2014 Elsevier Interactive Patient Education  2017 [employer].

## 2018-03-16 NOTE — Progress Notes (Addendum)
Subjective:   Steven Saunders is a 67 y.o. male who presents for an Initial Medicare Annual Wellness Visit.  Review of Systems  N/A  Cardiac Risk Factors include: advanced age (>31men, >93 women);dyslipidemia;hypertension;male gender;obesity (BMI >30kg/m2)    Objective:    Today's Vitals   03/16/18 0920  BP: 136/82  Pulse: 64  Temp: 98.1 F (36.7 C)  TempSrc: Oral  Weight: 260 lb 6.4 oz (118.1 kg)  Height: 6' (1.829 m)  PainSc: 0-No pain   Body mass index is 35.32 kg/m.  Advanced Directives 03/16/2018 05/26/2017 03/03/2017 11/15/2016  Does Patient Have a Medical Advance Directive? Yes Yes Yes Yes  Type of Paramedic of [place];Living will [place];Living will [place];Living will [place] in Chart? Yes Yes Yes No - copy requested    Current Medications (verified) Outpatient Encounter Medications as of 03/16/2018  Medication Sig  . APPLE CIDER VINEGAR PO Take by mouth daily.  Marland Kitchen aspirin EC 81 MG tablet Take 1 tablet (81 mg total) by mouth daily.  . carvedilol (COREG) 25 MG tablet TAKE 1 TABLET BY MOUTH TWO TIMES DAILY WITH A MEAL  . digoxin (LANOXIN) 0.125 MG tablet Take 1 tablet (125 mcg total) by mouth daily.  Marland Kitchen docusate (COLACE) 60 MG/15ML syrup Take 60 mg by mouth daily.    Marland Kitchen loratadine (CLARITIN) 10 MG tablet Take 10 mg by mouth daily.    Marland Kitchen losartan-hydrochlorothiazide (HYZAAR) 100-25 MG tablet TAKE 1 TABLET BY MOUTH ONCE DAILY  . lovastatin (MEVACOR) 20 MG tablet TAKE ONE TABLET  EACH EVENING  . Multiple Vitamin (MULTIVITAMIN) tablet Take 1 tablet by mouth daily.    . Naproxen Sodium (ALEVE PO) Take by mouth as needed (for headaches, pain etc...).  Marland Kitchen Pomegranate, Punica granatum, (POMEGRANATE PO) Take by mouth daily.  . sildenafil (REVATIO) 20 MG tablet Take 2-5 tablets (40-100 mg total) by mouth daily as needed.  . valACYclovir (VALTREX) 1000  MG tablet TAKE TWO TABLETS EVERY 12 HOURS IF NEEDED   No facility-administered encounter medications on file as of 03/16/2018.     Allergies (verified) Patient has no allergy information on record.   History: Past Medical History:  Diagnosis Date  . Cancer [facility])    prostate  . Hypertension   . Permanent atrial fibrillation [facility])    Past Surgical History:  Procedure Laterality Date  . APPENDECTOMY    . CARDIAC CATHETERIZATION  2007  . COLONOSCOPY WITH PROPOFOL N/A 05/26/2017   Procedure: COLONOSCOPY WITH PROPOFOL;  Surgeon: [name], MD;  Location: [facility] ENDOSCOPY;  Service: Endoscopy;  Laterality: N/A;  . HERNIA REPAIR     1950's x 2  . JOINT REPLACEMENT Right 2014  . thumb surgery  1980   had staph infection and had to cut out the area  . TOTAL HIP ARTHROPLASTY     Family History  Problem Relation Age of Onset  . Hypertension Mother   . Heart attack Father   . Melanoma Father   . Prostate cancer Neg Hx   . Bladder Cancer Neg Hx   . Kidney cancer Neg Hx    Social History   Socioeconomic History  . Marital status: Married    Spouse name: Not on file  . Number of children: 2  . Years of education: Not on file  . Highest education level: Some college, no degree  Occupational History  . Occupation: Retired  Comment: Still drives school bus  Social Needs  . Financial resource strain: Not hard at all  . Food insecurity:    Worry: Never true    Inability: Never true  . Transportation needs:    Medical: No    Non-medical: No  Tobacco Use  . Smoking status: Former Smoker    Packs/day: 2.00    Years: 15.00    Pack years: 30.00    Types: Cigarettes    Last attempt to quit: 11/12/2009    Years since quitting: 8.3  . Smokeless tobacco: Never Used  Substance and Sexual Activity  . Alcohol use: Yes    Alcohol/week: 4.8 - 6.0 oz    Types: 8 - 10 Cans of beer per week  . Drug use: No  . Sexual activity: Not on file  Lifestyle  . Physical activity:    Days  per week: Not on file    Minutes per session: Not on file  . Stress: Not at all  Relationships  . Social connections:    Talks on phone: Not on file    Gets together: Not on file    Attends religious service: Not on file    Active member of club or organization: Not on file    Attends meetings of clubs or organizations: Not on file    Relationship status: Not on file  Other Topics Concern  . Not on file  Social History Narrative  . Not on file   Tobacco Counseling Counseling given: Not Answered   Clinical Intake:  Pre-visit preparation completed: Yes  Pain Score: 0-No pain     Nutritional Status: BMI > 30  Obese Nutritional Risks: None Diabetes: No  How often do you need to have someone help you when you read instructions, pamphlets, or other written materials from your doctor or pharmacy?: 1 - Never  Interpreter Needed?: No  Information entered by :: [facility], LPN  Activities of Daily Living In your present state of health, do you have any difficulty performing the following activities: 03/16/2018  Hearing? N  Vision? N  Difficulty concentrating or making decisions? N  Walking or climbing stairs? Y  Comment Due to right hip pain.  Dressing or bathing? N  Doing errands, shopping? N  Preparing Food and eating ? N  Using the Toilet? N  In the past six months, have you accidently leaked urine? N  Do you have problems with loss of bowel control? N  Managing your Medications? N  Managing your Finances? N  Housekeeping or managing your Housekeeping? N  Some recent data might be hidden     Immunizations and Health Maintenance Immunization History  Administered Date(s) Administered  . Influenza-Unspecified 04/14/2017  . Pneumococcal Conjugate-13 04/14/2014  . Pneumococcal Polysaccharide-23 01/10/2016  . Tdap 04/14/2014   There are no preventive care reminders to display for this patient.  Patient Care Team: Birdie Sons, MD as PCP - General (Family  Medicine) Evans Lance, MD as Consulting Physician (Cardiology) Earnestine Leys, MD (Orthopedic Surgery) Nickie Retort, MD as Consulting Physician (Urology)  Indicate any recent Medical Services you may have received from other than [facility] providers in the past year (date may be approximate).    Assessment:   This is a routine wellness examination for [facility].  Hearing/Vision screen No exam data present  Dietary issues and exercise activities discussed: Current Exercise Habits: The patient does not participate in regular exercise at present, Exercise limited by: None identified  Goals    .  DIET - REDUCE SUGAR INTAKE     Recommend current diet plan of cutting out sugars and avoiding bad carbohydrates.       Depression Screen PHQ 2/9 Scores 03/16/2018 03/16/2018 03/03/2017 03/03/2017  PHQ - 2 Score 0 0 0 0  PHQ- 9 Score 0 - 0 -    Fall Risk Fall Risk  03/16/2018 03/03/2017 01/10/2016  Falls in the past year? No No No    Is the patient's home free of loose throw rugs in walkways, pet beds, electrical cords, etc?   yes      Grab bars in the bathroom? yes      Handrails on the stairs?   no      Adequate lighting?   yes  Timed Get Up and Go performed: N/A  Cognitive Function: Pt declined screening today.      6CIT Screen 03/03/2017  What Year? 0 points  What month? 0 points  What time? 0 points  Count back from 20 0 points  Months in reverse 0 points  Repeat phrase 0 points  Total Score 0    Screening Tests Health Maintenance  Topic Date Due  . INFLUENZA VACCINE  04/01/2018  . COLONOSCOPY  05/26/2022  . TETANUS/TDAP  04/14/2024  . Hepatitis C Screening  Completed  . PNA vac Low Risk Adult  Completed    Qualifies for Shingles Vaccine? Due for Shingles vaccine. Declined my offer to administer today. Education has been provided regarding the importance of this vaccine. Pt has been advised to call her insurance company to determine her out of pocket expense. Advised she  may also receive this vaccine at her local pharmacy or Health Dept. Verbalized acceptance and understanding.  Cancer Screenings: Lung: Low Dose CT Chest recommended if Age 61-80 years, 30 pack-year currently smoking OR have quit w/in 15years. Patient does qualify, however declines the chest CT scan at this time.  Colorectal: Up to date  Additional Screenings:  Hepatitis C Screening: Up to date      Plan:  I have personally reviewed and addressed the Medicare Annual Wellness questionnaire and have noted the following in the patient's chart:  A. Medical and social history B. Use of alcohol, tobacco or illicit drugs  C. Current medications and supplements D. Functional ability and status E.  Nutritional status F.  Physical activity G. Advance directives H. List of other physicians I.  Hospitalizations, surgeries, and ER visits in previous 12 months J.  [place] such as hearing and vision if needed, cognitive and depression L. Referrals and appointments - none  In addition, I have reviewed and discussed with patient certain preventive protocols, quality metrics, and best practice recommendations. A written personalized care plan for preventive services as well as general preventive health recommendations were provided to patient.  See attached scanned questionnaire for additional information.   Signed,  Fabio Neighbors, LPN Nurse Health Advisor   Nurse Recommendations: None. Pt declined the chest CT scan order today.

## 2018-03-19 NOTE — Progress Notes (Signed)
Patient: Steven Saunders, Male    DOB: 1950/11/16, 67 y.o.   MRN: [id number] Visit Date: 03/22/2018  Today's Provider: Lelon Huh, MD   Chief Complaint  Patient presents with  . Annual Exam  . Hypertension  . Atrial Fibrillation  . Hyperlipidemia   Subjective:   Patient saw McKenzie for AWV on 03/16/2018.   Complete Physical Steven Saunders is a 67 y.o. male. He feels fairly well. He reports exercising 2-3 times a week. He reports he is sleeping fairly well.  -----------------------------------------------------------   Hypertension, follow-up:  BP Readings from Last 3 Encounters:  03/22/18 (!) 156/84  03/16/18 136/82  07/16/17 (!) 178/98    He was last seen for hypertension 11 months ago.  BP at that visit was 130/90. Management since that visit includes; labs checked, no cahnges.He reports good compliance with treatment. He is not having side effects.  He is exercising. He is adherent to low salt diet.   Outside blood pressures are checked occasionally. He is experiencing none.  Patient denies chest pain, chest pressure/discomfort, claudication, dyspnea, exertional chest pressure/discomfort, fatigue, irregular heart beat, lower extremity edema, near-syncope, orthopnea, palpitations, paroxysmal nocturnal dyspnea, syncope and tachypnea.   Cardiovascular risk factors include advanced age (older than 67 for men, 60 for women), hypertension and male gender.  Use of agents associated with hypertension: NSAIDS.   ------------------------------------------------------------------------   Lipid/Cholesterol, Follow-up:   Last seen for this 11 months ago.  Management since that visit includes; labs checked, no cahnges.  Last Lipid Panel:    Component Value Date/Time   CHOL 136 04/09/2017 0946   TRIG 127 04/09/2017 0946   HDL 53 04/09/2017 0946   CHOLHDL 2.6 04/09/2017 0946   LDLCALC 58 04/09/2017 0946    He reports good compliance with  treatment. He is not having side effects.   Wt Readings from Last 3 Encounters:  03/22/18 258 lb (117 kg)  03/16/18 260 lb 6.4 oz (118.1 kg)  07/16/17 258 lb (117 kg)    ------------------------------------------------------------------------  Chronic atrial fibrillation ([place]) From 04/09/2017-Rate controlled. Patient refuses Noac or warfarin, will continue ECASE. Patient reports good compliance with treatment.    Elevated PSA From 04/09/2017-referred to Urology. He states he is scheduled to follow up with Dr. Sharin Grave in October and wants to go ahead and get PSA rechecked.     Review of Systems  Constitutional: Negative for appetite change, chills, fatigue and fever.  HENT: Negative for congestion, ear pain, hearing loss, nosebleeds and trouble swallowing.   Eyes: Negative for pain and visual disturbance.  Respiratory: Negative for cough, chest tightness and shortness of breath.   Cardiovascular: Negative for chest pain, palpitations and leg swelling.  Gastrointestinal: Negative for abdominal pain, blood in stool, constipation, diarrhea, nausea and vomiting.  Endocrine: Negative for polydipsia, polyphagia and polyuria.  Genitourinary: Negative for dysuria and flank pain.  Musculoskeletal: Negative for arthralgias, back pain, joint swelling, myalgias and neck stiffness.  Skin: Negative for color change, rash and wound.  Neurological: Negative for dizziness, tremors, seizures, speech difficulty, weakness, light-headedness and headaches.  Psychiatric/Behavioral: Negative for behavioral problems, confusion, decreased concentration, dysphoric mood and sleep disturbance. The patient is not nervous/anxious.   All other systems reviewed and are negative.   Social History   Socioeconomic History  . Marital status: Married    Spouse name: Not on file  . Number of children: 2  . Years of education: Not on file  . Highest education level: Some  college, no degree  Occupational History  .  Occupation: Retired    Comment: Still drives school bus  Social Needs  . Financial resource strain: Not hard at all  . Food insecurity:    Worry: Never true    Inability: Never true  . Transportation needs:    Medical: No    Non-medical: No  Tobacco Use  . Smoking status: Former Smoker    Packs/day: 2.00    Years: 15.00    Pack years: 30.00    Types: Cigarettes    Last attempt to quit: 11/12/2009    Years since quitting: 8.3  . Smokeless tobacco: Never Used  Substance and Sexual Activity  . Alcohol use: Yes    Alcohol/week: 4.8 - 6.0 oz    Types: 8 - 10 Cans of beer per week  . Drug use: No  . Sexual activity: Not on file  Lifestyle  . Physical activity:    Days per week: Not on file    Minutes per session: Not on file  . Stress: Not at all  Relationships  . Social connections:    Talks on phone: Not on file    Gets together: Not on file    Attends religious service: Not on file    Active member of club or organization: Not on file    Attends meetings of clubs or organizations: Not on file    Relationship status: Not on file  . Intimate partner violence:    Fear of current or ex partner: Not on file    Emotionally abused: Not on file    Physically abused: Not on file    Forced sexual activity: Not on file  Other Topics Concern  . Not on file  Social History Narrative  . Not on file    Past Medical History:  Diagnosis Date  . Cancer [facility])    prostate  . Hypertension   . Permanent atrial fibrillation [facility])      Patient Active Problem List   Diagnosis Date Noted  . History of colon polyps   . Polyp of sigmoid colon   . Benign neoplasm of transverse colon   . Elevated PSA 04/10/2017  . Nevus of back 01/10/2016  . History of adenomatous polyp of colon 01/08/2016  . Hyperlipidemia 01/08/2016  . Enthesopathy of hip 01/08/2016  . Impotence of organic origin 01/08/2016  . Abnormal blood sugar 01/08/2016  . History of tobacco use 01/08/2016  . OA  (osteoarthritis) of hip 01/08/2016  . Morbid obesity ([place]) 11/17/2008  . Essential hypertension 11/17/2008  . ATRIAL FIBRILLATION 11/17/2008    Past Surgical History:  Procedure Laterality Date  . APPENDECTOMY    . CARDIAC CATHETERIZATION  2007  . COLONOSCOPY WITH PROPOFOL N/A 05/26/2017   Procedure: COLONOSCOPY WITH PROPOFOL;  Surgeon: [name], MD;  Location: [facility] ENDOSCOPY;  Service: Endoscopy;  Laterality: N/A;  . HERNIA REPAIR     1950's x 2  . JOINT REPLACEMENT Right 2014  . thumb surgery  1980   had staph infection and had to cut out the area  . TOTAL HIP ARTHROPLASTY      His family history includes Heart attack in his father; Hypertension in his mother; Melanoma in his father. There is no history of Prostate cancer, Bladder Cancer, or Kidney cancer.      Current Outpatient Medications:  .  APPLE CIDER VINEGAR PO, Take by mouth daily., Disp: , Rfl:  .  aspirin EC 81 MG tablet, Take 1  tablet (81 mg total) by mouth daily., Disp: 90 tablet, Rfl: 3 .  carvedilol (COREG) 25 MG tablet, TAKE 1 TABLET BY MOUTH TWO TIMES DAILY WITH A MEAL, Disp: 180 tablet, Rfl: 3 .  digoxin (LANOXIN) 0.125 MG tablet, Take 1 tablet (125 mcg total) by mouth daily., Disp: 90 tablet, Rfl: 0 .  docusate (COLACE) 60 MG/15ML syrup, Take 60 mg by mouth daily.  , Disp: , Rfl:  .  loratadine (CLARITIN) 10 MG tablet, Take 10 mg by mouth daily.  , Disp: , Rfl:  .  losartan-hydrochlorothiazide (HYZAAR) 100-25 MG tablet, TAKE 1 TABLET BY MOUTH ONCE DAILY, Disp: 30 tablet, Rfl: 11 .  lovastatin (MEVACOR) 20 MG tablet, TAKE ONE TABLET  EACH EVENING, Disp: 30 tablet, Rfl: 12 .  Misc Natural Products (LUTEIN 20 PO), Take 1 tablet by mouth daily., Disp: , Rfl:  .  Multiple Vitamin (MULTIVITAMIN) tablet, Take 1 tablet by mouth daily.  , Disp: , Rfl:  .  Naproxen Sodium (ALEVE PO), Take by mouth as needed (for headaches, pain etc...)., Disp: , Rfl:  .  Pomegranate, Punica granatum, (POMEGRANATE PO), Take by mouth  daily., Disp: , Rfl:  .  sildenafil (REVATIO) 20 MG tablet, Take 2-5 tablets (40-100 mg total) by mouth daily as needed., Disp: 50 tablet, Rfl: 3 .  valACYclovir (VALTREX) 1000 MG tablet, TAKE TWO TABLETS EVERY 12 HOURS IF NEEDED, Disp: 20 tablet, Rfl: 5  Patient Care Team: Birdie Sons, MD as PCP - General (Family Medicine) Evans Lance, MD as Consulting Physician (Cardiology) Earnestine Leys, MD (Orthopedic Surgery) Nickie Retort, MD as Consulting Physician (Urology)     Objective:   Vitals: BP (!) 156/84 (BP Location: Right Arm, Cuff Size: Large)   Pulse 60   Temp 98 F (36.7 C) (Oral)   Resp 18   Ht 6' (1.829 m)   Wt 258 lb (117 kg)   SpO2 98% Comment: room air  BMI 34.99 kg/m    Vitals:   03/22/18 0911 03/22/18 0914  BP: (!) 158/88 (!) 156/84  Pulse: 60   Resp: 18   Temp: 98 F (36.7 C)   TempSrc: Oral   SpO2: 98%   Weight: 258 lb (117 kg)   Height: 6' (1.829 m)      Physical Exam   General Appearance:    Alert, cooperative, no distress, appears stated age  Head:    Normocephalic, without obvious abnormality, atraumatic  Eyes:    PERRL, conjunctiva/corneas clear, EOM's intact, fundi    benign, both eyes       Ears:    Normal TM's and external ear canals, both ears  Nose:   Nares normal, septum midline, mucosa normal, no drainage   or sinus tenderness  Throat:   Lips, mucosa, and tongue normal; teeth and gums normal  Neck:   Supple, symmetrical, trachea midline, no adenopathy;       thyroid:  No enlargement/tenderness/nodules; no carotid   bruit or JVD  Back:     Symmetric, no curvature, ROM normal, no CVA tenderness  Lungs:     Clear to auscultation bilaterally, respirations unlabored  Chest wall:    No tenderness or deformity  Heart:    Regular rate and rhythm, S1 and S2 normal, no murmur, rub   or gallop  Abdomen:     Soft, non-tender, bowel sounds active all four quadrants,    no masses, no organomegaly  Genitalia:    deferred  Rectal:  deferred  Extremities:   Extremities normal, atraumatic, no cyanosis or edema  Pulses:   2+ and symmetric all extremities  Skin:   Skin color, texture, turgor normal, no rashes or lesions  Lymph nodes:   Cervical, supraclavicular, and axillary nodes normal  Neurologic:   CNII-XII intact. Normal strength, sensation and reflexes      throughout    Activities of Daily Living In your present state of health, do you have any difficulty performing the following activities: 03/16/2018  Hearing? N  Vision? N  Difficulty concentrating or making decisions? N  Walking or climbing stairs? Y  Comment Due to right hip pain.  Dressing or bathing? N  Doing errands, shopping? N  Preparing Food and eating ? N  Using the Toilet? N  In the past six months, have you accidently leaked urine? N  Do you have problems with loss of bowel control? N  Managing your Medications? N  Managing your Finances? N  Housekeeping or managing your Housekeeping? N  Some recent data might be hidden    Fall Risk Assessment Fall Risk  03/16/2018 03/03/2017 01/10/2016  Falls in the past year? No No No     Depression Screen PHQ 2/9 Scores 03/16/2018 03/16/2018 03/03/2017 03/03/2017  PHQ - 2 Score 0 0 0 0  PHQ- 9 Score 0 - 0 -        Assessment & Plan:    Annual Physical Reviewed patient's Family Medical History Reviewed and updated list of patient's medical providers Assessment of cognitive impairment was done Assessed patient's functional ability Established a written schedule for health screening [place] Completed and Reviewed  Exercise Activities and Dietary recommendations Goals    . DIET - REDUCE SUGAR INTAKE     Recommend current diet plan of cutting out sugars and avoiding bad carbohydrates.        Immunization History  Administered Date(s) Administered  . Influenza-Unspecified 04/14/2017  . Pneumococcal Conjugate-13 04/14/2014  . Pneumococcal Polysaccharide-23 01/10/2016  .  Tdap 04/14/2014    Health Maintenance  Topic Date Due  . INFLUENZA VACCINE  04/01/2018  . COLONOSCOPY  05/26/2022  . TETANUS/TDAP  04/14/2024  . Hepatitis C Screening  Completed  . PNA vac Low Risk Adult  Completed     Discussed health benefits of physical activity, and encouraged him to engage in regular exercise appropriate for his age and condition.    ------------------------------------------------------------------------------------------------------------  1. Annual physical exam   2. Permanent atrial fibrillation ([facility])  - Digoxin level  3. Essential hypertension Unchanged. Change losartan-hctz to irbesartan-hctz 300-12.5 and recheck in about 2 months.   4. Abnormal blood sugar -hgba1c  5. Elevated PSA Follow up Dr. Sharin Grave as scheduled.  - PSA   6. Hyperlipidemia, unspecified hyperlipidemia type He is tolerating lovastatin well with no adverse effects.   - Comprehensive metabolic panel - Lipid panel   Lelon Huh, MD  [facility]

## 2018-03-22 ENCOUNTER — Ambulatory Visit (INDEPENDENT_AMBULATORY_CARE_PROVIDER_SITE_OTHER): Payer: PPO | Admitting: Family Medicine

## 2018-03-22 VITALS — BP 150/82 | HR 60 | Temp 98.0°F | Resp 18 | Ht 72.0 in | Wt 258.0 lb

## 2018-03-22 DIAGNOSIS — R7309 Other abnormal glucose: Secondary | ICD-10-CM

## 2018-03-22 DIAGNOSIS — R972 Elevated prostate specific antigen [PSA]: Secondary | ICD-10-CM

## 2018-03-22 DIAGNOSIS — I4821 Permanent atrial fibrillation: Secondary | ICD-10-CM

## 2018-03-22 DIAGNOSIS — E785 Hyperlipidemia, unspecified: Secondary | ICD-10-CM | POA: Diagnosis not present

## 2018-03-22 DIAGNOSIS — Z0000 Encounter for general adult medical examination without abnormal findings: Secondary | ICD-10-CM | POA: Diagnosis not present

## 2018-03-22 DIAGNOSIS — I10 Essential (primary) hypertension: Secondary | ICD-10-CM

## 2018-03-22 DIAGNOSIS — I482 Chronic atrial fibrillation: Secondary | ICD-10-CM | POA: Diagnosis not present

## 2018-03-22 MED ORDER — IRBESARTAN-HYDROCHLOROTHIAZIDE 300-12.5 MG PO TABS: 1.0000 | ORAL_TABLET | Freq: Every day | ORAL | 2 refills | Status: DC

## 2018-03-22 NOTE — Patient Instructions (Signed)
   The CDC recommends two doses of Shingrix (the shingles vaccine) separated by 2 to 6 months for adults age 69 years and older. I recommend checking with your insurance plan regarding coverage for this vaccine.   

## 2018-03-23 LAB — DIGOXIN LEVEL: Digoxin, Serum: 0.5 ng/mL (ref 0.5–0.9)

## 2018-03-23 LAB — LIPID PANEL
Chol/HDL Ratio: 2.6 ratio (ref 0.0–5.0)
Cholesterol, Total: 124 mg/dL (ref 100–199)
HDL: 48 mg/dL (ref >39)
LDL CALC: 53 mg/dL (ref 0–99)
Triglycerides: 113 mg/dL (ref 0–149)
VLDL CHOLESTEROL CAL: 23 mg/dL (ref 5–40)

## 2018-03-23 LAB — COMPREHENSIVE METABOLIC PANEL
ALT: 24 IU/L (ref 0–44)
AST: 20 IU/L (ref 0–40)
Albumin/Globulin Ratio: 1.8 (ref 1.2–2.2)
Albumin: 4.4 g/dL (ref 3.6–4.8)
Alkaline Phosphatase: 69 IU/L (ref 39–117)
BUN/Creatinine Ratio: 12 (ref 10–24)
BUN: 10 mg/dL (ref 8–27)
Bilirubin Total: 0.9 mg/dL (ref 0.0–1.2)
CALCIUM: 9.2 mg/dL (ref 8.6–10.2)
CO2: 23 mmol/L (ref 20–29)
CREATININE: 0.83 mg/dL (ref 0.76–1.27)
Chloride: 92 mmol/L — ABNORMAL LOW (ref 96–106)
GFR calc Af Amer: 104 mL/min/{1.73_m2} (ref >59)
GFR, EST NON AFRICAN AMERICAN: 90 mL/min/{1.73_m2} (ref >59)
GLUCOSE: 130 mg/dL — AB (ref 65–99)
Globulin, Total: 2.5 g/dL (ref 1.5–4.5)
Potassium: 4.6 mmol/L (ref 3.5–5.2)
Sodium: 131 mmol/L — ABNORMAL LOW (ref 134–144)
Total Protein: 6.9 g/dL (ref 6.0–8.5)

## 2018-03-23 LAB — PSA: Prostate Specific Ag, Serum: 6.3 ng/mL — ABNORMAL HIGH (ref 0.0–4.0)

## 2018-03-23 LAB — HEMOGLOBIN A1C
Est. average glucose Bld gHb Est-mCnc: 120 mg/dL
Hgb A1c MFr Bld: 5.8 % — ABNORMAL HIGH (ref 4.8–5.6)

## 2018-04-02 DIAGNOSIS — M1611 Unilateral primary osteoarthritis, right hip: Secondary | ICD-10-CM | POA: Diagnosis not present

## 2018-04-02 DIAGNOSIS — M1711 Unilateral primary osteoarthritis, right knee: Secondary | ICD-10-CM | POA: Diagnosis not present

## 2018-04-02 DIAGNOSIS — M48061 Spinal stenosis, lumbar region without neurogenic claudication: Secondary | ICD-10-CM | POA: Insufficient documentation

## 2018-04-02 DIAGNOSIS — Z96649 Presence of unspecified artificial hip joint: Secondary | ICD-10-CM | POA: Insufficient documentation

## 2018-04-16 ENCOUNTER — Encounter: Payer: Self-pay | Admitting: Family Medicine

## 2018-04-16 ENCOUNTER — Ambulatory Visit (INDEPENDENT_AMBULATORY_CARE_PROVIDER_SITE_OTHER): Payer: PPO | Admitting: Family Medicine

## 2018-04-16 ENCOUNTER — Other Ambulatory Visit: Payer: Self-pay

## 2018-04-16 VITALS — BP 170/102 | HR 72 | Temp 98.1°F | Resp 16 | Wt 265.8 lb

## 2018-04-16 DIAGNOSIS — M545 Low back pain: Secondary | ICD-10-CM | POA: Diagnosis not present

## 2018-04-16 DIAGNOSIS — M1611 Unilateral primary osteoarthritis, right hip: Secondary | ICD-10-CM | POA: Diagnosis not present

## 2018-04-16 DIAGNOSIS — M1711 Unilateral primary osteoarthritis, right knee: Secondary | ICD-10-CM | POA: Diagnosis not present

## 2018-04-16 DIAGNOSIS — I10 Essential (primary) hypertension: Secondary | ICD-10-CM

## 2018-04-16 DIAGNOSIS — M48061 Spinal stenosis, lumbar region without neurogenic claudication: Secondary | ICD-10-CM | POA: Diagnosis not present

## 2018-04-16 DIAGNOSIS — Z96649 Presence of unspecified artificial hip joint: Secondary | ICD-10-CM | POA: Diagnosis not present

## 2018-04-16 MED ORDER — HYDROCHLOROTHIAZIDE 25 MG PO TABS: 25.0000 mg | ORAL_TABLET | Freq: Every day | ORAL | 0 refills | Status: DC

## 2018-04-16 NOTE — Patient Instructions (Signed)
Stop the meloxicam

## 2018-04-16 NOTE — Progress Notes (Signed)
Patient walked into the office today for a nurse blood pressure check. He reports his blood pressure has been elevated, and this week he has been short of breath. I checked his reading and the results are as followed:   183/115 right arm (automatic cuff) 170/98 left arm (manually)   I consulted with Dr. Caryn Section who advised me that patient needs an office visit for evaluation. Appointment scheduled today at 11pm with Steven Ginsberg, PA-C.

## 2018-04-16 NOTE — Progress Notes (Signed)
  Subjective:     Patient ID: Steven Saunders, male   DOB: 01-16-1951, 67 y.o.   MRN: [id number] Chief Complaint  Patient presents with  . Shortness of Breath    Patient comes in office today with concerns of shortness of breath on exertion for the past week, patient states that upon awakening in the morning he has a hard time taking a deep breath. Patient states that he had concerns about his blood pressure since Dr. Caryn Section changed blood pressure medication. Patient reports b/p readings >176/70   HPI Reports compliance with medication and has recently been placed on meloxicam with prednisone ordered today per orthopedics. No chest pain or palpitations. Reports prior negative cardiology evaluations for CAD.  Review of Systems     Objective:   Physical Exam  Constitutional: He appears well-developed and well-nourished. No distress.  Cardiovascular: Normal rate.  Irregular rhythm.  Pulmonary/Chest: Breath sounds normal.  Musculoskeletal:       Right lower leg: He exhibits no edema.       Left lower leg: He exhibits no edema.       Assessment:    1. Essential hypertension; case discussed with Dr. Caryn Section. Will add additional diuretic. - hydrochlorothiazide (HYDRODIURIL) 25 MG tablet; Take 1 tablet (25 mg total) by mouth daily.  Dispense: 30 tablet; Refill: 0    Plan:    Stop meloxicam. F/u in one week

## 2018-04-16 NOTE — Progress Notes (Signed)
error 

## 2018-04-23 ENCOUNTER — Other Ambulatory Visit: Payer: Self-pay | Admitting: Family Medicine

## 2018-04-23 ENCOUNTER — Ambulatory Visit (INDEPENDENT_AMBULATORY_CARE_PROVIDER_SITE_OTHER): Payer: PPO | Admitting: Family Medicine

## 2018-04-23 ENCOUNTER — Encounter: Payer: Self-pay | Admitting: Family Medicine

## 2018-04-23 DIAGNOSIS — I10 Essential (primary) hypertension: Secondary | ICD-10-CM | POA: Diagnosis not present

## 2018-04-23 MED ORDER — IRBESARTAN-HYDROCHLOROTHIAZIDE 300-12.5 MG PO TABS: 1.0000 | ORAL_TABLET | Freq: Every day | ORAL | 2 refills | Status: DC

## 2018-04-23 NOTE — Progress Notes (Signed)
Patient: Steven Saunders Male    DOB: [birth year]   67 y.o.   MRN: [id number] Visit Date: 04/23/2018  Today's Provider: Lelon Huh, MD   Chief Complaint  Patient presents with  . Hypertension   Subjective:    HPI   Hypertension, follow-up:  BP Readings from Last 3 Encounters:  04/23/18 126/76  04/16/18 (!) 170/102  04/16/18 (!) 170/98    He was last seen for hypertension 7 days ago by Steven Saunders BP at that visit was 170/102 and he was having some shortness of breath. He had recently been started on meloxicam for back pain and changed from losartan hctz 100-25 to irbesartan-hctz 300-12.5. At that visit he had meloxicam discontinued and added additional  25 mg hctz. Qd. He states that by the next day he was feeling much better. dysnea had resolved, and blood pressure was back to normal.  Outside blood pressures are averaging 150-168/ 79-85.  Patient denies chest pain, chest pressure/discomfort, claudication, dyspnea, exertional chest pressure/discomfort, fatigue, irregular heart beat, lower extremity edema, near-syncope, orthopnea, palpitations, paroxysmal nocturnal dyspnea, syncope and tachypnea.     ------------------------------------------------------------------------    No Known Allergies   Current Outpatient Medications:  .  APPLE CIDER VINEGAR PO, Take by mouth daily., Disp: , Rfl:  .  aspirin EC 81 MG tablet, Take 1 tablet (81 mg total) by mouth daily., Disp: 90 tablet, Rfl: 3 .  carvedilol (COREG) 25 MG tablet, TAKE 1 TABLET BY MOUTH TWO TIMES DAILY WITH A MEAL, Disp: 180 tablet, Rfl: 3 .  digoxin (LANOXIN) 0.125 MG tablet, Take 1 tablet (125 mcg total) by mouth daily., Disp: 90 tablet, Rfl: 0 .  docusate (COLACE) 60 MG/15ML syrup, Take 60 mg by mouth daily.  , Disp: , Rfl:  .  hydrochlorothiazide (HYDRODIURIL) 25 MG tablet, Take 1 tablet (25 mg total) by mouth daily., Disp: 30 tablet, Rfl: 0 .  irbesartan-hydrochlorothiazide (AVALIDE) 300-12.5 MG  tablet, Take 1 tablet by mouth daily., Disp: 30 tablet, Rfl: 2 .  loratadine (CLARITIN) 10 MG tablet, Take 10 mg by mouth daily.  , Disp: , Rfl:  .  lovastatin (MEVACOR) 20 MG tablet, TAKE ONE TABLET  EACH EVENING, Disp: 30 tablet, Rfl: 12 .  Misc Natural Products (LUTEIN 20 PO), Take 1 tablet by mouth daily., Disp: , Rfl:  .  Multiple Vitamin (MULTIVITAMIN) tablet, Take 1 tablet by mouth daily.  , Disp: , Rfl:  .  Naproxen Sodium (ALEVE PO), Take by mouth as needed (for headaches, pain etc...)., Disp: , Rfl:  .  Pomegranate, Punica granatum, (POMEGRANATE PO), Take by mouth daily., Disp: , Rfl:  .  predniSONE (DELTASONE) 10 MG tablet, , Disp: , Rfl:  .  sildenafil (REVATIO) 20 MG tablet, Take 2-5 tablets (40-100 mg total) by mouth daily as needed., Disp: 50 tablet, Rfl: 3 .  valACYclovir (VALTREX) 1000 MG tablet, TAKE TWO TABLETS EVERY 12 HOURS IF NEEDED, Disp: 20 tablet, Rfl: 5 .  meloxicam (MOBIC) 15 MG tablet, Mobic 15 mg tablet  Take 1 tablet every day by oral route., Disp: , Rfl:  (NOT TAKING)  Review of Systems  Constitutional: Negative for appetite change, chills and fever.  Respiratory: Negative for chest tightness, shortness of breath and wheezing.   Cardiovascular: Negative for chest pain and palpitations.  Gastrointestinal: Negative for abdominal pain, nausea and vomiting.    Social History   Tobacco Use  . Smoking status: Former Smoker    Packs/day: 2.00  Years: 15.00    Pack years: 30.00    Types: Cigarettes    Last attempt to quit: 11/12/2009    Years since quitting: 8.4  . Smokeless tobacco: Never Used  Substance Use Topics  . Alcohol use: Yes    Alcohol/week: 8.0 - 10.0 standard drinks    Types: 8 - 10 Cans of beer per week   Objective:   BP 126/76 (BP Location: Right Arm, Cuff Size: Large)   Pulse (!) 59   Temp 97.9 F (36.6 C) (Oral)   Resp 16   Wt 252 lb (114.3 kg)   SpO2 99% Comment: room air  BMI 34.18 kg/m  Vitals:   04/23/18 0850 04/23/18 0855    BP: (!) 136/93 126/76  Pulse: (!) 59   Resp: 16   Temp: 97.9 F (36.6 C)   TempSrc: Oral   SpO2: 99%   Weight: 252 lb (114.3 kg)      Physical Exam   General appearance: alert, well developed, well nourished, cooperative and in no distress Head: Normocephalic, without obvious abnormality, atraumatic Respiratory: Respirations even and unlabored, normal respiratory rate Extremities: No gross deformities Skin: Skin color, texture, turgor normal. No rashes seen  Psych: Appropriate mood and affect. Neurologic: Mental status: Alert, oriented to person, place, and time, thought content appropriate.     Assessment & Plan:     1. Essential hypertension Much better with discontinuation of meloxicam and additional 25mg  hctz. Continue current medications.   - irbesartan-hydrochlorothiazide (AVALIDE) 300-12.5 MG tablet; Take 1 tablet by mouth daily.  Dispense: 30 tablet; Refill: 2  Return in about 6 weeks to check BP end renal panel.       Lelon Huh, MD  [facility]

## 2018-04-30 DIAGNOSIS — M1711 Unilateral primary osteoarthritis, right knee: Secondary | ICD-10-CM | POA: Diagnosis not present

## 2018-05-17 DIAGNOSIS — M545 Low back pain: Secondary | ICD-10-CM | POA: Diagnosis not present

## 2018-05-20 ENCOUNTER — Other Ambulatory Visit: Payer: Self-pay | Admitting: Family Medicine

## 2018-05-20 DIAGNOSIS — I10 Essential (primary) hypertension: Secondary | ICD-10-CM

## 2018-05-20 NOTE — Telephone Encounter (Signed)
See refill request.

## 2018-05-21 DIAGNOSIS — M48061 Spinal stenosis, lumbar region without neurogenic claudication: Secondary | ICD-10-CM | POA: Diagnosis not present

## 2018-05-25 ENCOUNTER — Encounter: Payer: Self-pay | Admitting: Internal Medicine

## 2018-06-03 NOTE — Progress Notes (Signed)
Patient: Steven Saunders Male    DOB: 27-Feb-1951   67 y.o.   MRN: [id number] Visit Date: 06/04/2018  Today's Provider: Lelon Huh, MD   Chief Complaint  Patient presents with  . Hypertension   Subjective:    HPI    Hypertension, follow-up:  BP Readings from Last 3 Encounters:  06/04/18 (!) 144/90  04/23/18 126/76  04/16/18 (!) 170/102    He was last seen for hypertension 6 weeks ago.  BP at that visit was 126 /76. At that time we had recently added additional 25mg  hctz to his BP regiment due to BP of 170/102 on 8/16  He reports good compliance with treatment. He is not having side effects.  He is not exercising. He is adherent to low salt diet.    Since last visit he has started back on meloxicam for OA of hip   ------------------------------------------------------------------------    No Known Allergies   Current Outpatient Medications:  .  APPLE CIDER VINEGAR PO, Take by mouth daily., Disp: , Rfl:  .  aspirin EC 81 MG tablet, Take 1 tablet (81 mg total) by mouth daily., Disp: 90 tablet, Rfl: 3 .  carvedilol (COREG) 25 MG tablet, TAKE 1 TABLET BY MOUTH TWO TIMES DAILY WITH A MEAL, Disp: 180 tablet, Rfl: 3 .  digoxin (LANOXIN) 0.125 MG tablet, TAKE 1 TABLET BY MOUTH ONCE DAILY, Disp: 90 tablet, Rfl: 3 .  docusate (COLACE) 60 MG/15ML syrup, Take 60 mg by mouth daily.  , Disp: , Rfl:  .  hydrochlorothiazide (HYDRODIURIL) 25 MG tablet, TAKE 1 TABLET BY MOUTH ONCE DAILY, Disp: 90 tablet, Rfl: 3 .  irbesartan-hydrochlorothiazide (AVALIDE) 300-12.5 MG tablet, Take 1 tablet by mouth daily., Disp: 30 tablet, Rfl: 2 .  loratadine (CLARITIN) 10 MG tablet, Take 10 mg by mouth daily.  , Disp: , Rfl:  .  lovastatin (MEVACOR) 20 MG tablet, TAKE 1 TABLET BY MOUTH ONCE EVERY EVENING AS DIRECTED, Disp: 30 tablet, Rfl: 12 .  Misc Natural Products (LUTEIN 20 PO), Take 1 tablet by mouth daily., Disp: , Rfl:  .  Multiple Vitamin (MULTIVITAMIN) tablet, Take 1 tablet by  mouth daily.  , Disp: , Rfl:  .  Naproxen Sodium (ALEVE PO), Take by mouth as needed (for headaches, pain etc...)., Disp: , Rfl:  .  Pomegranate, Punica granatum, (POMEGRANATE PO), Take by mouth daily., Disp: , Rfl:  .  sildenafil (REVATIO) 20 MG tablet, Take 2-5 tablets (40-100 mg total) by mouth daily as needed., Disp: 50 tablet, Rfl: 3 .  valACYclovir (VALTREX) 1000 MG tablet, TAKE TWO TABLETS EVERY 12 HOURS IF NEEDED, Disp: 20 tablet, Rfl: 5  Review of Systems  Constitutional: Negative for appetite change, chills and fever.  Respiratory: Negative for chest tightness, shortness of breath and wheezing.   Cardiovascular: Negative for chest pain and palpitations.  Gastrointestinal: Negative for abdominal pain, nausea and vomiting.    Social History   Tobacco Use  . Smoking status: Former Smoker    Packs/day: 2.00    Years: 15.00    Pack years: 30.00    Types: Cigarettes    Last attempt to quit: 11/12/2009    Years since quitting: 8.5  . Smokeless tobacco: Never Used  Substance Use Topics  . Alcohol use: Yes    Alcohol/week: 8.0 - 10.0 standard drinks    Types: 8 - 10 Cans of beer per week   Objective:    Vitals:   06/04/18 0815 06/04/18  3383 06/04/18 0832  BP: (!) 157/94 (!) 144/90 138/78  Pulse: 71    Resp: 16    Temp: 98 F (36.7 C)    TempSrc: Oral    SpO2: 99%    Weight: 256 lb (116.1 kg)       Physical Exam   General Appearance:    Alert, cooperative, no distress  Eyes:    PERRL, conjunctiva/corneas clear, EOM's intact       Lungs:     Clear to auscultation bilaterally, respirations unlabored  Heart:    Regular rate and rhythm  Neurologic:   Awake, alert, oriented x 3. No apparent focal neurological           defect.           Assessment & Plan:     1. Essential hypertension Improved since adding additional 25mg  hctz, but up since last visit, likely due to restarting meloxicam. Will continue current medication. May need to add CCB. He does have  appointment with Dr. Lovena Le next week and will see how BP is then.  - Renal function panel'      Lelon Huh, MD  [facility]

## 2018-06-04 ENCOUNTER — Ambulatory Visit (INDEPENDENT_AMBULATORY_CARE_PROVIDER_SITE_OTHER): Payer: PPO | Admitting: Family Medicine

## 2018-06-04 ENCOUNTER — Encounter: Payer: Self-pay | Admitting: Family Medicine

## 2018-06-04 VITALS — BP 138/78 | HR 71 | Temp 98.0°F | Resp 16 | Wt 256.0 lb

## 2018-06-04 DIAGNOSIS — I10 Essential (primary) hypertension: Secondary | ICD-10-CM

## 2018-06-04 NOTE — Patient Instructions (Signed)
.   I recommend that you get a flu vaccine this year. Please call our office at [phone] at your earliest convenience to schedule a flu shot.    

## 2018-06-05 LAB — RENAL FUNCTION PANEL
ALBUMIN: 4.4 g/dL (ref 3.6–4.8)
BUN/Creatinine Ratio: 17 (ref 10–24)
BUN: 14 mg/dL (ref 8–27)
CALCIUM: 9.2 mg/dL (ref 8.6–10.2)
CO2: 20 mmol/L (ref 20–29)
Chloride: 94 mmol/L — ABNORMAL LOW (ref 96–106)
Creatinine, Ser: 0.83 mg/dL (ref 0.76–1.27)
GFR calc Af Amer: 104 mL/min/{1.73_m2} (ref >59)
GFR calc non Af Amer: 90 mL/min/{1.73_m2} (ref >59)
GLUCOSE: 140 mg/dL — AB (ref 65–99)
PHOSPHORUS: 3.7 mg/dL (ref 2.5–4.5)
POTASSIUM: 4.4 mmol/L (ref 3.5–5.2)
SODIUM: 134 mmol/L (ref 134–144)

## 2018-06-09 ENCOUNTER — Encounter: Payer: Self-pay | Admitting: Internal Medicine

## 2018-06-09 ENCOUNTER — Ambulatory Visit: Payer: PPO | Admitting: Internal Medicine

## 2018-06-09 VITALS — BP 138/86 | HR 62 | Ht 72.0 in | Wt 257.2 lb

## 2018-06-09 DIAGNOSIS — I4821 Permanent atrial fibrillation: Secondary | ICD-10-CM | POA: Diagnosis not present

## 2018-06-09 DIAGNOSIS — I10 Essential (primary) hypertension: Secondary | ICD-10-CM

## 2018-06-09 NOTE — Progress Notes (Signed)
HPI Mr. Cajamarca returns today for followup. He is a very pleasant 67 year old man with a history of hypertension, and atrial fibrillation. He continues to do well. 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 He denies chest pain, sob, or sincope. No edema. He admits to some dietary indiscretion and has not been able to lose weight. He refuses to take systemic anti-coagulation.     No Known Allergies   Current Outpatient Medications  Medication Sig Dispense Refill  . APPLE CIDER VINEGAR PO Take by mouth daily.    Marland Kitchen aspirin EC 81 MG tablet Take 1 tablet (81 mg total) by mouth daily. 90 tablet 3  . carvedilol (COREG) 25 MG tablet TAKE 1 TABLET BY MOUTH TWO TIMES DAILY WITH A MEAL 180 tablet 3  . digoxin (LANOXIN) 0.125 MG tablet TAKE 1 TABLET BY MOUTH ONCE DAILY 90 tablet 3  . docusate (COLACE) 60 MG/15ML syrup Take 60 mg by mouth daily.      . hydrochlorothiazide (HYDRODIURIL) 25 MG tablet TAKE 1 TABLET BY MOUTH ONCE DAILY 90 tablet 3  . irbesartan-hydrochlorothiazide (AVALIDE) 300-12.5 MG tablet Take 1 tablet by mouth daily. 30 tablet 2  . loratadine (CLARITIN) 10 MG tablet Take 10 mg by mouth daily.      Marland Kitchen lovastatin (MEVACOR) 20 MG tablet TAKE 1 TABLET BY MOUTH ONCE EVERY EVENING AS DIRECTED 30 tablet 12  . meloxicam (MOBIC) 15 MG tablet Take 15 mg by mouth daily.    . Misc Natural Products (LUTEIN 20 PO) Take 1 tablet by mouth daily.    . Multiple Vitamin (MULTIVITAMIN) tablet Take 1 tablet by mouth daily.      . Naproxen Sodium (ALEVE PO) Take by mouth as needed (for headaches, pain etc...).    Marland Kitchen Pomegranate, Punica granatum, (POMEGRANATE PO) Take by mouth daily.    . sildenafil (REVATIO) 20 MG tablet Take 2-5 tablets (40-100 mg total) by mouth daily as needed. 50 tablet 3  . valACYclovir (VALTREX) 1000 MG tablet TAKE TWO TABLETS EVERY 12 HOURS IF NEEDED 20 tablet 5   No current facility-administered medications for  this visit.      Past Medical History:  Diagnosis Date  . Cancer [facility])    prostate  . Hypertension   . Permanent atrial fibrillation     ROS:   All systems reviewed and negative except as noted in the HPI.   Past Surgical History:  Procedure Laterality Date  . APPENDECTOMY    . CARDIAC CATHETERIZATION  2007  . COLONOSCOPY WITH PROPOFOL N/A 05/26/2017   Procedure: COLONOSCOPY WITH PROPOFOL;  Surgeon: [name], MD;  Location: [facility] ENDOSCOPY;  Service: Endoscopy;  Laterality: N/A;  . HERNIA REPAIR     1950's x 2  . JOINT REPLACEMENT Right 2014  . thumb surgery  1980   had staph infection and had to cut out the area  . TOTAL HIP ARTHROPLASTY       Family History  Problem Relation Age of Onset  . Hypertension Mother   . Heart attack Father   . Melanoma Father   . Prostate cancer Neg Hx   . Bladder Cancer Neg Hx   . Kidney cancer Neg Hx      Social History   Socioeconomic History  . Marital status: Married    Spouse name: Not on file  . Number of children: 2  . Years of  education: Not on file  . Highest education level: Some college, no degree  Occupational History  . Occupation: Retired    Comment: Still drives school bus  Social Needs  . Financial resource strain: Not hard at all  . Food insecurity:    Worry: Never true    Inability: Never true  . Transportation needs:    Medical: No    Non-medical: No  Tobacco Use  . Smoking status: Former Smoker    Packs/day: 2.00    Years: 15.00    Pack years: 30.00    Types: Cigarettes    Last attempt to quit: 11/12/2009    Years since quitting: 8.5  . Smokeless tobacco: Never Used  Substance and Sexual Activity  . Alcohol use: Yes    Alcohol/week: 8.0 - 10.0 standard drinks    Types: 8 - 10 Cans of beer per week  . Drug use: No  . Sexual activity: Not on file  Lifestyle  . Physical activity:    Days per week: Not on file    Minutes per session: Not on file  . Stress: Not at all  Relationships  .  Social connections:    Talks on phone: Not on file    Gets together: Not on file    Attends religious service: Not on file    Active member of club or organization: Not on file    Attends meetings of clubs or organizations: Not on file    Relationship status: Not on file  . Intimate partner violence:    Fear of current or ex partner: Not on file    Emotionally abused: Not on file    Physically abused: Not on file    Forced sexual activity: Not on file  Other Topics Concern  . Not on file  Social History Narrative  . Not on file     BP 138/86   Pulse 62   Ht 6' (1.829 m)   Wt 257 lb 3.2 oz (116.7 kg)   SpO2 97%   BMI 34.88 kg/m   Physical Exam:  Well appearing NAD HEENT: Unremarkable Neck:  No JVD, no thyromegally Lymphatics:  No adenopathy Back:  No CVA tenderness Lungs:  Clear HEART:  IRegular rate rhythm, no murmurs, no rubs, no clicks Abd:  soft, positive bowel sounds, no organomegally, no rebound, no guarding Ext:  2 plus pulses, no edema, no cyanosis, no clubbing Skin:  No rashes no nodules Neuro:  CN II through XII intact, motor grossly intact  EKG - atrial fib with a controlled VR and RBBB   Assess/Plan: 1. Atrial fib - his VR is controlled. He refuses systemic anti-coagulation 2. HTN - his blood pressure is well controlled.  Mikle Bosworth.D.

## 2018-06-09 NOTE — Patient Instructions (Signed)

## 2018-06-14 ENCOUNTER — Other Ambulatory Visit: Payer: Self-pay | Admitting: Family Medicine

## 2018-06-25 ENCOUNTER — Other Ambulatory Visit: Payer: Self-pay

## 2018-07-06 ENCOUNTER — Other Ambulatory Visit: Payer: PPO | Admitting: Urology

## 2018-07-08 IMAGING — CR DG CHEST 2V
1 series · 2 of 2 positions shown · non-contrast
Comparison: Portable chest x-ray dated September 18, 2005.

CLINICAL DATA: Off, chest congestion, and fever to 102 degrees for
the past 5 days. Negative flu test. History of atrial fibrillation,
former smoker, morbid obesity.

EXAM:
CHEST  2 VIEW

[Series 1: dg chest 2 view · 0.14mm/px · 2 of 2 slices shown]
[im 1/2]
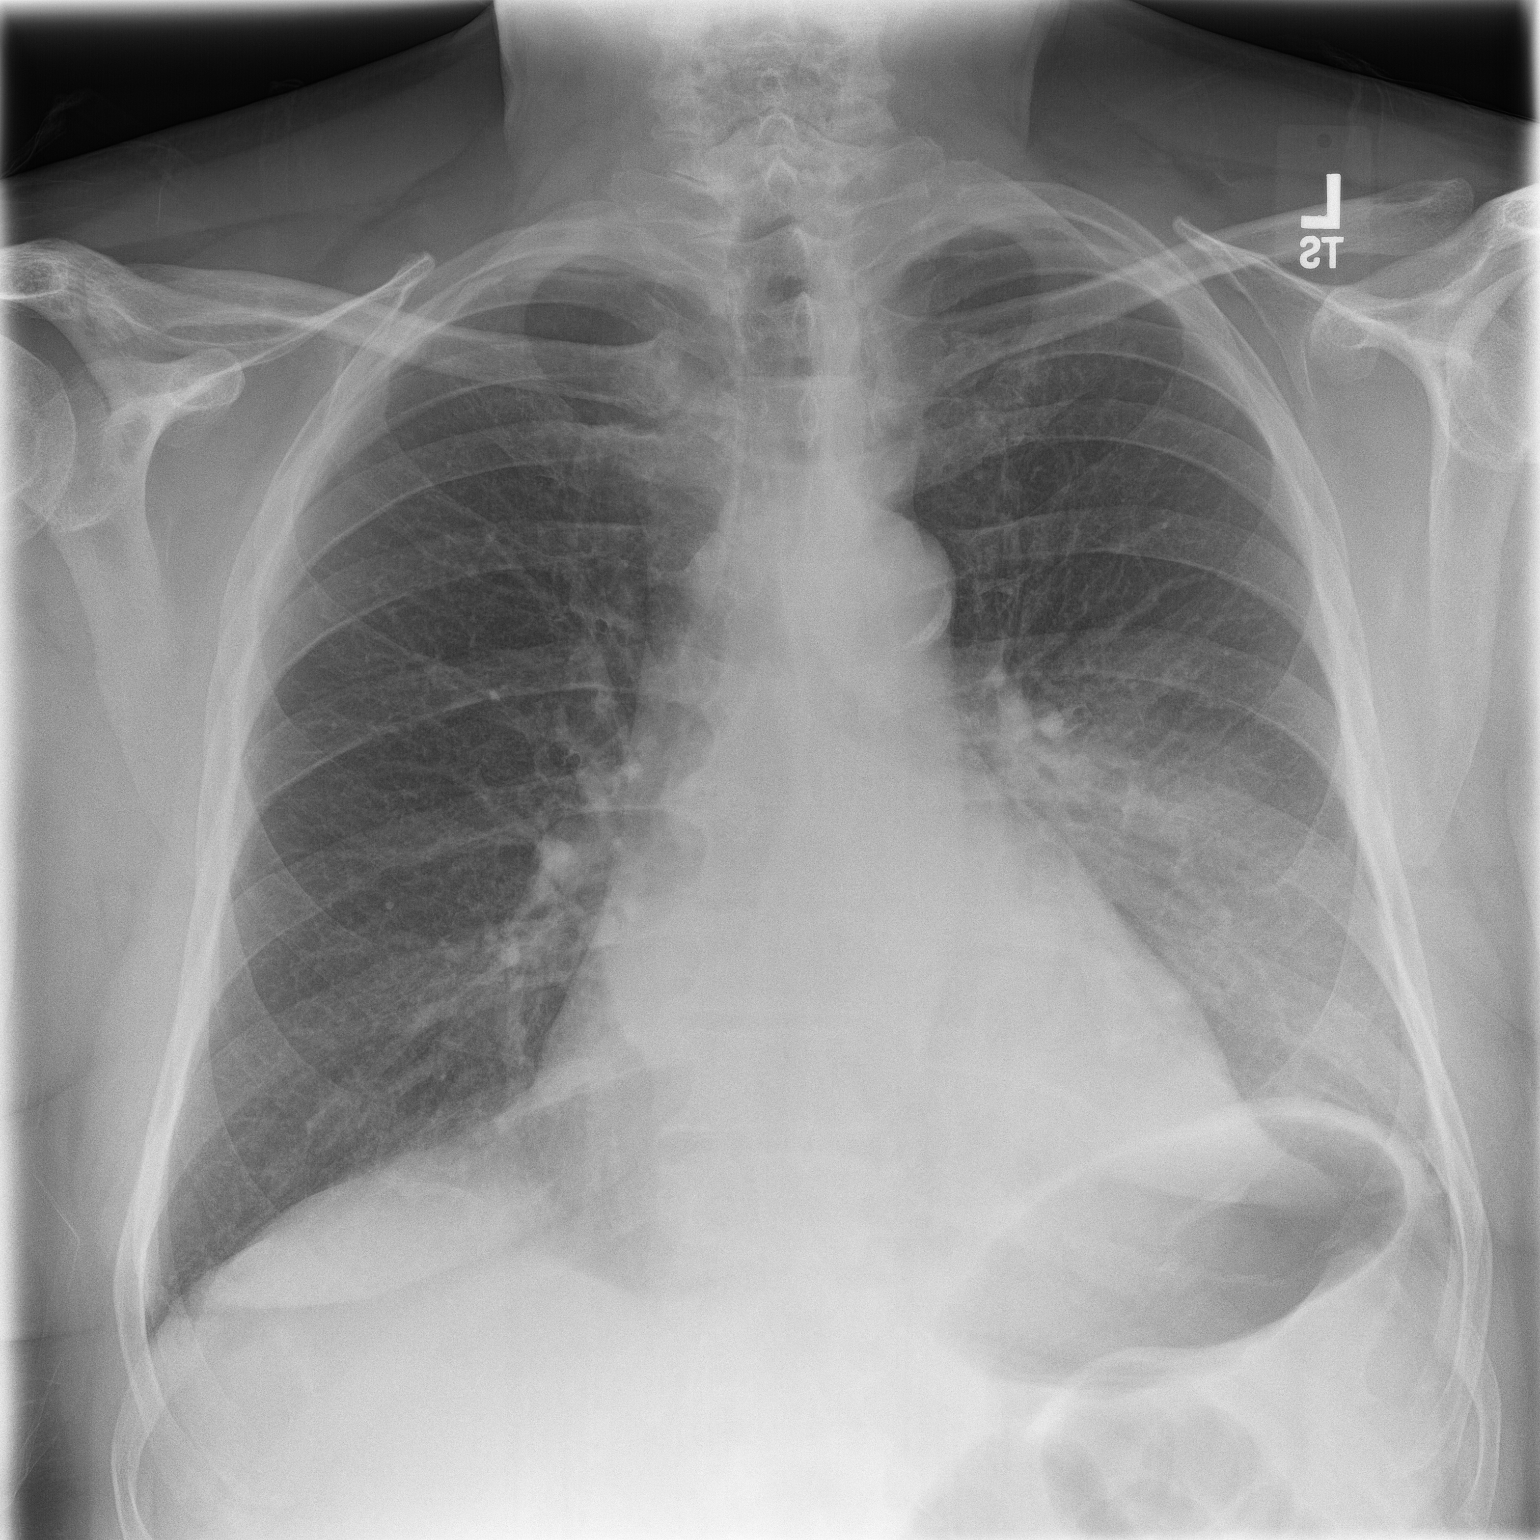
[im 2/2]
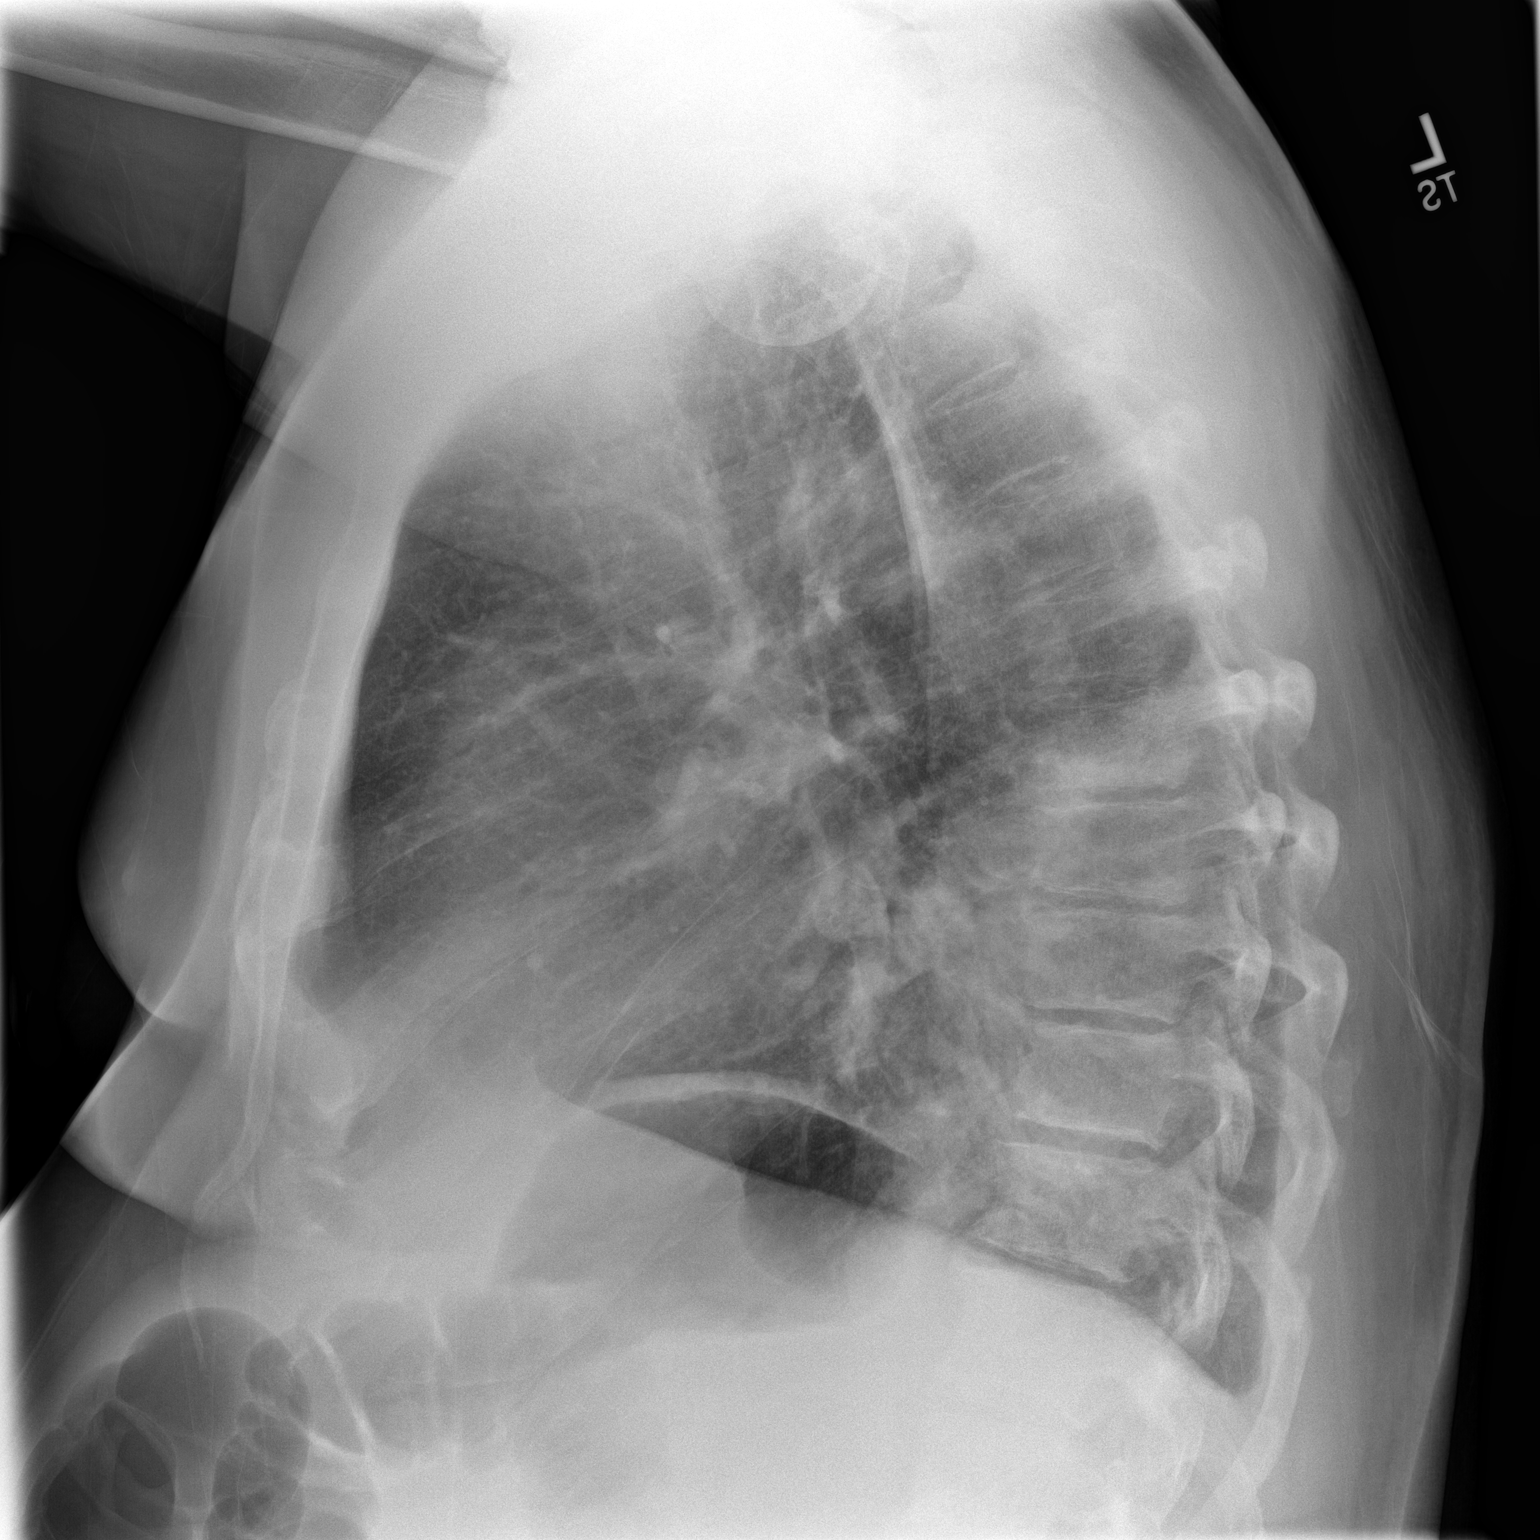

[2 of 2 positions shown; findings below may reference images not displayed]

FINDINGS: There is dense infiltrate in the left lower lobe. There may be a
small left pleural effusion. The right lung is clear. The heart is
top-normal in size. The pulmonary vascularity is normal. There is
calcification in the wall of the aortic arch. The bony thorax
exhibits no acute abnormality.
IMPRESSION: Left lower lobe pneumonia. Followup PA and lateral chest X-ray is
recommended in 3-4 weeks following trial of antibiotic therapy to
ensure resolution and exclude underlying malignancy.

Mild cardiomegaly without CHF.

Thoracic aortic atherosclerosis.

## 2018-07-09 ENCOUNTER — Other Ambulatory Visit: Payer: PPO | Admitting: Urology

## 2018-07-09 ENCOUNTER — Ambulatory Visit: Payer: Self-pay

## 2018-07-20 ENCOUNTER — Ambulatory Visit: Payer: PPO | Admitting: Urology

## 2018-07-20 ENCOUNTER — Encounter: Payer: Self-pay | Admitting: Urology

## 2018-07-20 ENCOUNTER — Other Ambulatory Visit: Payer: Self-pay | Admitting: Urology

## 2018-07-20 ENCOUNTER — Other Ambulatory Visit: Payer: Self-pay

## 2018-07-20 VITALS — BP 174/107 | HR 67 | Ht 72.0 in | Wt 258.0 lb

## 2018-07-20 DIAGNOSIS — C61 Malignant neoplasm of prostate: Secondary | ICD-10-CM | POA: Diagnosis not present

## 2018-07-20 MED ORDER — GENTAMICIN SULFATE 40 MG/ML IJ SOLN: 80.0000 mg | Freq: Once | INTRAMUSCULAR | Status: DC

## 2018-07-20 MED ORDER — LEVOFLOXACIN 500 MG PO TABS: 500.0000 mg | ORAL_TABLET | Freq: Once | ORAL | Status: DC

## 2018-07-20 NOTE — Progress Notes (Signed)
   07/20/18  Indication: Low risk PCa on prior biopsy 07/2017, here for confirmatory biopsy for active surveillance  Prostate Biopsy Procedure   Informed consent was obtained, and we discussed the risks of bleeding and infection/sepsis. A time out was performed to ensure correct patient identity.  Pre-Procedure: - Last PSA Level: 6.3 - Gentamicin and levaquin given for antibiotic prophylaxis - Transrectal Ultrasound performed revealing a 51 gm prostate, PSA density 0.12 - No significant hypoechoic or median lobe noted  Procedure: - Prostate block performed using 10 cc 1% lidocaine and biopsies taken from sextant areas, a total of 12 under ultrasound guidance.  Post-Procedure: - Patient tolerated the procedure well - He was counseled to seek immediate medical attention if experiences significant bleeding, fevers, or severe pain - Return in one week to discuss biopsy results  Assessment/ Plan: Will follow up in 1-2 weeks to discuss pathology  Nickolas Madrid, MD 07/20/2018

## 2018-07-26 LAB — PATHOLOGY REPORT

## 2018-07-28 ENCOUNTER — Other Ambulatory Visit: Payer: Self-pay | Admitting: Urology

## 2018-08-09 ENCOUNTER — Ambulatory Visit: Payer: PPO | Admitting: Urology

## 2018-08-09 ENCOUNTER — Encounter: Payer: Self-pay | Admitting: Urology

## 2018-08-09 VITALS — BP 168/100 | HR 76 | Ht 72.0 in | Wt 257.0 lb

## 2018-08-09 DIAGNOSIS — C61 Malignant neoplasm of prostate: Secondary | ICD-10-CM

## 2018-08-09 NOTE — Progress Notes (Addendum)
08/09/2018 8:52 AM   Steven Saunders 10-20-1950 [id number]  Reason for visit: Discuss prostate biopsy results  HPI: I saw Steven Saunders back in urology clinic today to discuss his prostate biopsy results.  He is a 67 year old male with a cardiac history, mild lower urinary tract symptoms, and erectile dysfunction on sildenafil.  He originally underwent a prostate biopsy for an elevated PSA of 7.3 with Dr. Pilar Jarvis in November 2018.  This showed low risk disease with Gleason score 3+3 equal 6 and 8/12 cores, with max core involvement of 37%.  He elected for active surveillance with confirmatory biopsy.  He underwent a confirmatory biopsy with myself 07/20/2018, with PSA of 6.3.  This showed primarily Gleason score 3+3 = 6 in 9/12 cores, however there was a single core of Gleason score 3+4 = 7 with grade 4 comprising 10% of the tumor.  Max core involvement overall was 40%.  He has done well since his biopsy and denies any fevers, his hematospermia is improving.  He would strongly like to remain on active surveillance.  We had a lengthy conversation today about the patient's new diagnosis of prostate cancer.  We reviewed the risk classifications per the AUA guidelines including very low risk, low risk, intermediate risk, and high risk disease, and the need for additional staging imaging with CT and bone scan in patients with unfavorable intermediate risk and high risk disease.  I explained that his life expectancy, clinical stage, Gleason score, PSA, and other co-morbidities influence treatment strategies.  We discussed the roles of active surveillance, radiation therapy, surgical therapy with robotic prostatectomy, and hormone therapy with androgen deprivation.  We discussed that patients urinary symptoms also impact treatment strategy, as patients with severe lower urinary tract symptoms may have significant worsening or even develop urinary retention after undergoing radiation.  In regards to  surgery, we discussed robotic prostatectomy +/- lymphadenectomy at length.  The procedure takes 3 to 4 hours, and patient's typically discharge home on post-op day #1.  A Foley catheter is left in place for 7 to 10 days to allow for healing of the vesicourethral anastomosis.  There is a small risk of bleeding, infection, damage to surrounding structures or bowel, hernia, DVT/PE, or serious cardiac or pulmonary complications.  We discussed at length post-op side effects including erectile dysfunction, and the importance of pre-operative erectile function on long-term outcomes.  Even with a nerve sparing approach, there is an approximately 25% rate of permanent erectile dysfunction.  We also discussed postop urinary incontinence at length.  We expect patients to have stress incontinence post-operatively that will improve over period of weeks to months.  Less than 10% of men will require a pad at 1 year after surgery.  Patients will need to avoid heavy lifting and strenuous activity for 3 to 4 weeks, but most men return to their baseline activity status by 6 weeks.  In summary, Steven Saunders is a 67 y.o. man originally diagnosed with low risk prostate cancer in November 2018, with confirmatory biopsy November 2019 showing primarily low risk disease, however there was a single core of Gleason score 3+4 = 7, with 10% of pattern for disease.  We long conversation about these findings including his technical upstaging to favorable intermediate risk.  We discussed typically these patients are managed with treatment with surgery or radiation, however active surveillance is still a reasonable option in some cases.  We discussed the 25 to 30% risk of failing active surveillance with ultimate  need for treatment in the future.  He has done quite a bit of reading and is very educated, and he would like to continue active surveillance.  I did discuss the small, but not 0, risk of clinical spread or metastatic disease on  active surveillance with pattern for disease.  He understands these risks and agrees to continue active surveillance.  He has an artificial hip and would not be a candidate for prostate MRI.  Repeat PSA and DRE in 6 months Low threshold to repeat biopsy or move to definitive treatment, likely with radiation, if PSA rises.  A total of 15 minutes were spent face-to-face with the patient, greater than 50% was spent in patient education, counseling, and coordination of care regarding favorable intermediate risk prostate cancer, active surveillance, need for close follow-up.   Billey Co, [facility] [address] [place], [place] [address] [phone]

## 2018-08-30 DIAGNOSIS — M5127 Other intervertebral disc displacement, lumbosacral region: Secondary | ICD-10-CM | POA: Diagnosis not present

## 2018-08-30 DIAGNOSIS — M9903 Segmental and somatic dysfunction of lumbar region: Secondary | ICD-10-CM | POA: Diagnosis not present

## 2018-08-31 DIAGNOSIS — M9903 Segmental and somatic dysfunction of lumbar region: Secondary | ICD-10-CM | POA: Diagnosis not present

## 2018-08-31 DIAGNOSIS — M5127 Other intervertebral disc displacement, lumbosacral region: Secondary | ICD-10-CM | POA: Diagnosis not present

## 2018-09-02 DIAGNOSIS — M9903 Segmental and somatic dysfunction of lumbar region: Secondary | ICD-10-CM | POA: Diagnosis not present

## 2018-09-02 DIAGNOSIS — M5127 Other intervertebral disc displacement, lumbosacral region: Secondary | ICD-10-CM | POA: Diagnosis not present

## 2018-09-06 DIAGNOSIS — M9903 Segmental and somatic dysfunction of lumbar region: Secondary | ICD-10-CM | POA: Diagnosis not present

## 2018-09-06 DIAGNOSIS — M5127 Other intervertebral disc displacement, lumbosacral region: Secondary | ICD-10-CM | POA: Diagnosis not present

## 2018-09-08 DIAGNOSIS — M9903 Segmental and somatic dysfunction of lumbar region: Secondary | ICD-10-CM | POA: Diagnosis not present

## 2018-09-08 DIAGNOSIS — M5127 Other intervertebral disc displacement, lumbosacral region: Secondary | ICD-10-CM | POA: Diagnosis not present

## 2018-09-15 DIAGNOSIS — M5127 Other intervertebral disc displacement, lumbosacral region: Secondary | ICD-10-CM | POA: Diagnosis not present

## 2018-09-15 DIAGNOSIS — M9903 Segmental and somatic dysfunction of lumbar region: Secondary | ICD-10-CM | POA: Diagnosis not present

## 2018-09-20 ENCOUNTER — Other Ambulatory Visit: Payer: Self-pay | Admitting: Family Medicine

## 2018-09-20 DIAGNOSIS — I10 Essential (primary) hypertension: Secondary | ICD-10-CM

## 2018-11-12 DIAGNOSIS — M1611 Unilateral primary osteoarthritis, right hip: Secondary | ICD-10-CM | POA: Diagnosis not present

## 2018-11-12 DIAGNOSIS — M48061 Spinal stenosis, lumbar region without neurogenic claudication: Secondary | ICD-10-CM | POA: Diagnosis not present

## 2018-11-15 DIAGNOSIS — M1611 Unilateral primary osteoarthritis, right hip: Secondary | ICD-10-CM | POA: Diagnosis not present

## 2018-11-15 DIAGNOSIS — M48061 Spinal stenosis, lumbar region without neurogenic claudication: Secondary | ICD-10-CM | POA: Diagnosis not present

## 2018-12-06 ENCOUNTER — Other Ambulatory Visit: Payer: Self-pay | Admitting: Family Medicine

## 2018-12-15 DIAGNOSIS — M1611 Unilateral primary osteoarthritis, right hip: Secondary | ICD-10-CM | POA: Diagnosis not present

## 2018-12-15 DIAGNOSIS — M48061 Spinal stenosis, lumbar region without neurogenic claudication: Secondary | ICD-10-CM | POA: Diagnosis not present

## 2018-12-15 DIAGNOSIS — Z79899 Other long term (current) drug therapy: Secondary | ICD-10-CM | POA: Diagnosis not present

## 2019-01-21 DIAGNOSIS — M5416 Radiculopathy, lumbar region: Secondary | ICD-10-CM | POA: Diagnosis not present

## 2019-02-04 ENCOUNTER — Other Ambulatory Visit: Payer: PPO

## 2019-02-04 ENCOUNTER — Other Ambulatory Visit: Payer: Self-pay

## 2019-02-04 DIAGNOSIS — C61 Malignant neoplasm of prostate: Secondary | ICD-10-CM

## 2019-02-05 LAB — PSA: Prostate Specific Ag, Serum: 8.1 ng/mL — ABNORMAL HIGH (ref 0.0–4.0)

## 2019-02-08 ENCOUNTER — Other Ambulatory Visit: Payer: Self-pay

## 2019-02-08 ENCOUNTER — Ambulatory Visit: Payer: PPO | Admitting: Urology

## 2019-02-08 ENCOUNTER — Encounter: Payer: Self-pay | Admitting: Urology

## 2019-02-08 VITALS — BP 149/82 | HR 73 | Ht 72.0 in | Wt 254.9 lb

## 2019-02-08 DIAGNOSIS — N529 Male erectile dysfunction, unspecified: Secondary | ICD-10-CM

## 2019-02-08 DIAGNOSIS — C61 Malignant neoplasm of prostate: Secondary | ICD-10-CM

## 2019-02-08 MED ORDER — TADALAFIL 20 MG PO TABS: 20.0000 mg | ORAL_TABLET | Freq: Every day | ORAL | 11 refills | Status: DC | PRN

## 2019-02-08 NOTE — Progress Notes (Signed)
° °  02/08/2019 10:22 AM   Steven Saunders [birth year] [id number]  Reason for visit: Follow up prostate cancer on active surveillance  HPI: I saw [name] back in urology clinic today for active surveillance for favorable intermediate risk prostate cancer.  He is a 68 year old male with a cardiac history, mild lower urinary tract symptoms, and erectile dysfunction on sildenafil.  He originally underwent a prostate biopsy for an elevated PSA of 7.3 with Dr. Pilar Jarvis in November 2018.  This showed low risk disease with Gleason score 3+3 equal 6 and 8/12 cores, with max core involvement of 37%.  He elected for active surveillance with confirmatory biopsy.  He underwent a confirmatory biopsy with myself 07/20/2018, with PSA of 6.3.  This showed primarily Gleason score 3+3 = 6 in 9/12 cores, however there was a single core of Gleason score 3+4 = 7 with grade 4 comprising 10% of the tumor.  Max core involvement overall was 40%.  Prostate volume was 51 g.  We had discussed the AUA guidelines regarding low risk, intermediate risk, and high risk disease and treatment strategies including active surveillance versus intervention with surgery or radiation at our last visit.  The patient was adamant about remaining on active surveillance.  We discussed the risks of this at length.  PSA today is relatively stable at 8.1 from 7.3 at time of initial diagnosis.  We again reviewed treatment strategies for favorable intermediate risk disease, and that active surveillance is not typically the first-line management.  He is again adamant that he would like to remain on active surveillance, especially as he has had numerous other medical problems recently including ongoing hip pain that will likely require hip replacement, as well as spinal stenosis requiring injections.  We discussed the risks of active surveillance including the low, but not 0, risk of developing metastatic prostate cancer.  He understands these risks and  would like to continue active surveillance.  He has mild urinary symptoms at baseline with minimal bother during the day, but nocturia 2-3 times per night.  We discussed strategies including minimizing fluid in the evening, and double voiding prior to bed.  He also has moderate erectile dysfunction that is mostly responsive to Revatio, however he has noticed the effectiveness has decreased over the last few months.  He takes 100 mg Revatio as needed.  DRE today limited secondary to body habitus, 50 g gland, no masses or nodules.  In summary, the patient is a 68 year old male who has chosen active surveillance for favorable intermediate risk prostate cancer.  PSA is relatively stable at 8.1 from 7.3 at time of diagnosis.  DRE is limited secondary to body habitus, but no gross abnormalities.  We discussed other strategies including prostate MRI (he has a hip replacement that is MRI compatible reportedly), repeat biopsy, or definitive treatment with radiation or surgery.  He has a number of other medical issues right now and would like to defer further work-up at this time.  RTC 6 months for PSA/DRE for active surveillance, re-consider prostate MRI if PSA rising Trial of Cialis 20 mg as needed for ED, good Rx coupon provided   A total of 15 minutes were spent face-to-face with the patient, greater than 50% was spent in patient education, counseling, and coordination of care regarding active surveillance for prostate cancer, erectile dysfunction, and urinary symptoms.  Billey Co, [facility] [address] [place], [place] [address] [phone]

## 2019-02-10 DIAGNOSIS — M5416 Radiculopathy, lumbar region: Secondary | ICD-10-CM | POA: Diagnosis not present

## 2019-02-14 ENCOUNTER — Encounter: Payer: Self-pay | Admitting: Family Medicine

## 2019-02-16 DIAGNOSIS — M5416 Radiculopathy, lumbar region: Secondary | ICD-10-CM | POA: Diagnosis not present

## 2019-02-19 ENCOUNTER — Other Ambulatory Visit: Payer: Self-pay | Admitting: Family Medicine

## 2019-02-24 ENCOUNTER — Telehealth: Payer: Self-pay | Admitting: *Deleted

## 2019-02-24 DIAGNOSIS — M1611 Unilateral primary osteoarthritis, right hip: Secondary | ICD-10-CM | POA: Diagnosis not present

## 2019-02-24 NOTE — Telephone Encounter (Signed)
   [facility] Pre-operative Risk Assessment    Request for surgical clearance:  1. What type of surgery is being performed? RT THA ANTERIOR HIP  2. When is this surgery scheduled? SURGERY DATE 05/04/19; SURGEON STATES PRE-OP DATE IS 04/20/19  3. What type of clearance is required (medical clearance vs. Pharmacy clearance to hold med vs. Both)? MEDICAL  4. Are there any medications that need to be held prior to surgery and how long? ASA   5. Practice name and name of physician performing surgery? [facility]; DR. Jeneen Rinks [place]  6. What is your office phone number [phone]   7.   What is your office fax number [phone]  8.   Anesthesia type (None, local, MAC, general) ? CHOICE   Julaine Hua 02/24/2019, 4:28 PM  _________________________________________________________________   (provider comments below)

## 2019-02-25 NOTE — Telephone Encounter (Signed)
Holding ASA is ok. GT

## 2019-02-25 NOTE — Telephone Encounter (Signed)
Dr. Lovena Le, Steven Saunders has history of permanent atrial fibrillation on aspirin (refused anticoagulation), would you be ok with him holding the aspirin for 5-7 days prior to hip surgery. Please send your reply to P CV DIV PREOP  Thank you

## 2019-02-28 ENCOUNTER — Ambulatory Visit: Payer: Self-pay | Admitting: Orthopedic Surgery

## 2019-02-28 NOTE — Telephone Encounter (Signed)
   Primary Cardiologist: Cristopher Peru, MD  Chart reviewed as part of pre-operative protocol coverage. Patient was contacted 02/28/2019 in reference to pre-operative risk assessment for pending surgery as outlined below.  Steven Saunders was last seen on 06/09/18 by Dr. Lovena Le.  Since that day, Steven Saunders has done well. He does not have a history of CAD or MI. Despite hip pain, he can still complete 4.0 METS without anginal symptoms. He denies changes to his cardiac health and history. He takes ASA only for atrial fibrillation.   Per Dr. Lovena Le: OK to hold ASA as requested.   Therefore, based on ACC/AHA guidelines, the patient would be at acceptable risk for the planned procedure without further cardiovascular testing.   I will route this recommendation to the requesting party via Epic fax function and remove from pre-op pool.  Please call with questions.  Tami Lin Duke, PA 02/28/2019, 9:59 AM

## 2019-03-02 ENCOUNTER — Other Ambulatory Visit: Payer: Self-pay

## 2019-03-02 DIAGNOSIS — N401 Enlarged prostate with lower urinary tract symptoms: Secondary | ICD-10-CM

## 2019-03-02 MED ORDER — TAMSULOSIN HCL 0.4 MG PO CAPS: 0.4000 mg | ORAL_CAPSULE | Freq: Every day | ORAL | 3 refills | Status: DC

## 2019-03-03 ENCOUNTER — Encounter: Payer: Self-pay | Admitting: Family Medicine

## 2019-03-03 DIAGNOSIS — M87051 Idiopathic aseptic necrosis of right femur: Secondary | ICD-10-CM | POA: Insufficient documentation

## 2019-04-20 DIAGNOSIS — Z01818 Encounter for other preprocedural examination: Secondary | ICD-10-CM | POA: Diagnosis not present

## 2019-04-20 DIAGNOSIS — M1611 Unilateral primary osteoarthritis, right hip: Secondary | ICD-10-CM | POA: Diagnosis not present

## 2019-04-25 ENCOUNTER — Ambulatory Visit (INDEPENDENT_AMBULATORY_CARE_PROVIDER_SITE_OTHER): Payer: PPO | Admitting: Family Medicine

## 2019-04-25 ENCOUNTER — Encounter: Payer: Self-pay | Admitting: Family Medicine

## 2019-04-25 ENCOUNTER — Other Ambulatory Visit: Payer: Self-pay

## 2019-04-25 VITALS — BP 150/86 | HR 75 | Temp 96.8°F | Resp 16 | Wt 245.0 lb

## 2019-04-25 DIAGNOSIS — Z01818 Encounter for other preprocedural examination: Secondary | ICD-10-CM | POA: Diagnosis not present

## 2019-04-25 DIAGNOSIS — I4821 Permanent atrial fibrillation: Secondary | ICD-10-CM

## 2019-04-25 DIAGNOSIS — I10 Essential (primary) hypertension: Secondary | ICD-10-CM

## 2019-04-25 NOTE — Progress Notes (Signed)
Patient: Steven Saunders. Male    DOB: 01/22/1951   68 y.o.   MRN: [id number] Visit Date: 04/25/2019  Today's Provider: Lelon Huh, MD   Chief Complaint  Patient presents with  . Medical Clearance   Subjective:     HPI Surgical Clearance: Patient plans to have Total right hip arthoplasty on 05/04/2019. Surgeon will be Dr. Kurtis Bushman. He feels well denies chest pain, palpitations and shortness of breath. Is scheduled for pre-op labs later this week. Is followed by Dr. Lovena Le for a-fib and patient reports cleared him for surgery. Denies any history of surgical or anesthetic complications.   Past Surgical History:  Procedure Laterality Date  . APPENDECTOMY    . CARDIAC CATHETERIZATION  2007  . COLONOSCOPY WITH PROPOFOL N/A 05/26/2017   Procedure: COLONOSCOPY WITH PROPOFOL;  Surgeon: [name], MD;  Location: [facility] ENDOSCOPY;  Service: Endoscopy;  Laterality: N/A;  . HERNIA REPAIR     1950's x 2  . JOINT REPLACEMENT Left 2014  . PROSTATE BIOPSY    . thumb surgery  1980   had staph infection and had to cut out the area  . TOTAL HIP ARTHROPLASTY Left 2014   Dr. Sabra Heck    No Known Allergies   Current Outpatient Medications:  .  acetaminophen (TYLENOL) 500 MG tablet, Take 500 mg by mouth 2 (two) times daily., Disp: , Rfl:  .  aspirin EC 81 MG tablet, Take 1 tablet (81 mg total) by mouth daily., Disp: 90 tablet, Rfl: 3 .  carvedilol (COREG) 25 MG tablet, Take 1 tablet (25 mg total) by mouth 2 (two) times daily., Disp: 180 tablet, Rfl: 4 .  digoxin (LANOXIN) 0.125 MG tablet, TAKE 1 TABLET BY MOUTH ONCE DAILY (Patient taking differently: Take 0.125 mg by mouth daily. ), Disp: 90 tablet, Rfl: 4 .  docusate sodium (COLACE) 100 MG capsule, Take 100 mg by mouth daily., Disp: , Rfl:  .  glucosamine-chondroitin 500-400 MG tablet, Take 1 tablet by mouth daily., Disp: , Rfl:  .  hydrochlorothiazide (HYDRODIURIL) 25 MG tablet, TAKE 1 TABLET BY MOUTH ONCE DAILY (Patient taking  differently: Take 25 mg by mouth daily. ), Disp: 90 tablet, Rfl: 3 .  irbesartan-hydrochlorothiazide (AVALIDE) 300-12.5 MG tablet, TAKE 1 TABLET BY MOUTH ONCE DAILY (Patient taking differently: Take 1 tablet by mouth daily. ), Disp: 30 tablet, Rfl: 11 .  loratadine (CLARITIN) 10 MG tablet, Take 10 mg by mouth daily.  , Disp: , Rfl:  .  lovastatin (MEVACOR) 20 MG tablet, TAKE 1 TABLET BY MOUTH ONCE EVERY EVENING AS DIRECTED (Patient taking differently: Take 20 mg by mouth at bedtime. ), Disp: 30 tablet, Rfl: 12 .  Lutein 40 MG CAPS, Take 40 mg by mouth daily., Disp: , Rfl:  .  meloxicam (MOBIC) 15 MG tablet, Take 15 mg by mouth daily., Disp: , Rfl:  .  Multiple Vitamin (MULTIVITAMIN) tablet, Take 1 tablet by mouth daily.  , Disp: , Rfl:  .  sildenafil (REVATIO) 20 MG tablet, Take 2-5 tablets (40-100 mg total) by mouth daily as needed., Disp: 50 tablet, Rfl: 3 .  tadalafil (CIALIS) 20 MG tablet, Take 1 tablet (20 mg total) by mouth daily as needed for erectile dysfunction., Disp: 15 tablet, Rfl: 11 .  tamsulosin (FLOMAX) 0.4 MG CAPS capsule, Take 1 capsule (0.4 mg total) by mouth daily., Disp: 30 capsule, Rfl: 3 .  valACYclovir (VALTREX) 1000 MG tablet, TAKE 2 TABLETS BY MOUTH EVERY 12 HOURS  AS NEEDED (Patient taking differently: Take 2,000 mg by mouth every 12 (twelve) hours as needed (outbreaks). ), Disp: 20 tablet, Rfl: 5  Review of Systems  Constitutional: Negative for appetite change, chills and fever.  Respiratory: Negative for chest tightness, shortness of breath and wheezing.   Cardiovascular: Negative for chest pain and palpitations.  Gastrointestinal: Negative for abdominal pain, nausea and vomiting.  Musculoskeletal: Positive for arthralgias (hip pain).    Social History   Tobacco Use  . Smoking status: Former Smoker    Packs/day: 2.00    Years: 15.00    Pack years: 30.00    Types: Cigarettes    Quit date: 11/12/2009    Years since quitting: 9.4  . Smokeless tobacco: Never Used   Substance Use Topics  . Alcohol use: Yes    Alcohol/week: 8.0 - 10.0 standard drinks    Types: 8 - 10 Cans of beer per week      Objective:   BP (!) 150/86 (BP Location: Right Arm, Cuff Size: Large)   Pulse 75   Temp (!) 96.8 F (36 C) (Temporal)   Resp 16   Wt 245 lb (111.1 kg)   SpO2 97% Comment: room air  BMI 33.23 kg/m  Vitals:   04/25/19 1059 04/25/19 1101  BP: (!) 152/84 (!) 150/86  Pulse: 75   Resp: 16   Temp: (!) 96.8 F (36 C)   TempSrc: Temporal   SpO2: 97%   Weight: 245 lb (111.1 kg)      Physical Exam   General Appearance:    Alert, cooperative, no distress, appears stated age  Head:    Normocephalic, without obvious abnormality, atraumatic  Eyes:    PERRL, conjunctiva/corneas clear, EOM's intact, fundi    benign, both eyes       Ears:    Normal TM's and external ear canals, both ears  Nose:   Nares normal, septum midline, mucosa normal, no drainage   or sinus tenderness  Throat:   Lips, mucosa, and tongue normal; teeth and gums normal  Neck:   Supple, symmetrical, trachea midline, no adenopathy;       thyroid:  No enlargement/tenderness/nodules; no carotid   bruit or JVD  Back:     Symmetric, no curvature, ROM normal, no CVA tenderness  Lungs:     Clear to auscultation bilaterally, respirations unlabored  Chest wall:    No tenderness or deformity  Heart:    Normal heart rate. Irregularly irregular rhythm. No murmurs, rubs, or gallops.  S1 and S2 normal  Abdomen:     Soft, non-tender, bowel sounds active all four quadrants,    no masses, no organomegaly  Extremities:   All extremities are intact. No cyanosis or edema  Pulses:   2+ and symmetric all extremities  Skin:   Skin color, texture, turgor normal, no rashes or lesions  Lymph nodes:   Cervical, supraclavicular, and axillary nodes normal  Neurologic:   CNII-XII intact. Normal strength, sensation and reflexes      throughout        Assessment & Plan    1. Encounter for preoperative  examination for general surgical procedure Patient has no absolute or relative contraindications to planned surgery and anesthesia including general anesthesia. He is at low risk for non-cardiac complications. Anticipates pre-op labs being done at upcoming appointment. If labs normal then no additional medical precautions recommended.  Have forwarded copy of today's EKG to patient's cardiologist Dr. Lovena Le.   - EKG 12-Lead  2.  Permanent atrial fibrillation Rate well controlled. On ECASA antiplatelet/anticoagulation management deferred to cardiology.   3. Essential hypertension Doing well current medications. BP somewhat elevated today, but usually well controlled on current regiment which is to be continued. Consider addition of amlodipine if BP remains elevated at anesthesia pre-op     Lelon Huh, MD  [place]

## 2019-04-27 ENCOUNTER — Encounter
Admission: RE | Admit: 2019-04-27 | Discharge: 2019-04-27 | Disposition: A | Discharge status: Home / Self Care | Payer: PPO | Source: Ambulatory Visit | Attending: Orthopedic Surgery | Admitting: Orthopedic Surgery

## 2019-04-27 ENCOUNTER — Other Ambulatory Visit: Payer: Self-pay

## 2019-04-27 DIAGNOSIS — Z01812 Encounter for preprocedural laboratory examination: Secondary | ICD-10-CM | POA: Diagnosis not present

## 2019-04-27 DIAGNOSIS — Z20828 Contact with and (suspected) exposure to other viral communicable diseases: Secondary | ICD-10-CM | POA: Diagnosis not present

## 2019-04-27 LAB — URINALYSIS, ROUTINE W REFLEX MICROSCOPIC
Bilirubin Urine: NEGATIVE
Glucose, UA: NEGATIVE mg/dL
Hgb urine dipstick: NEGATIVE
Ketones, ur: NEGATIVE mg/dL
Leukocytes,Ua: NEGATIVE
Nitrite: NEGATIVE
Protein, ur: NEGATIVE mg/dL
Specific Gravity, Urine: 1.015 (ref 1.005–1.030)
pH: 7 (ref 5.0–8.0)

## 2019-04-27 LAB — BASIC METABOLIC PANEL
Anion gap: 10 (ref 5–15)
BUN: 10 mg/dL (ref 8–23)
CO2: 28 mmol/L (ref 22–32)
Calcium: 9.3 mg/dL (ref 8.9–10.3)
Chloride: 92 mmol/L — ABNORMAL LOW (ref 98–111)
Creatinine, Ser: 0.71 mg/dL (ref 0.61–1.24)
GFR calc Af Amer: >60 mL/min (ref >60)
GFR calc non Af Amer: >60 mL/min (ref >60)
Glucose, Bld: 121 mg/dL — ABNORMAL HIGH (ref 70–99)
Potassium: 3.5 mmol/L (ref 3.5–5.1)
Sodium: 130 mmol/L — ABNORMAL LOW (ref 135–145)

## 2019-04-27 LAB — CBC
HCT: 40.8 % (ref 39.0–52.0)
Hemoglobin: 14.9 g/dL (ref 13.0–17.0)
MCH: 34.6 pg — ABNORMAL HIGH (ref 26.0–34.0)
MCHC: 36.5 g/dL — ABNORMAL HIGH (ref 30.0–36.0)
MCV: 94.7 fL (ref 80.0–100.0)
Platelets: 161 10*3/uL (ref 150–400)
RBC: 4.31 MIL/uL (ref 4.22–5.81)
RDW: 12.8 % (ref 11.5–15.5)
WBC: 9.1 10*3/uL (ref 4.0–10.5)
nRBC: 0 % (ref 0.0–0.2)

## 2019-04-27 LAB — TYPE AND SCREEN
ABO/RH(D): O NEG
Antibody Screen: NEGATIVE

## 2019-04-27 LAB — SURGICAL PCR SCREEN
MRSA, PCR: NEGATIVE
Staphylococcus aureus: NEGATIVE

## 2019-04-27 LAB — PROTIME-INR
INR: 1 (ref 0.8–1.2)
Prothrombin Time: 13.3 seconds (ref 11.4–15.2)

## 2019-04-27 LAB — APTT: aPTT: 31 seconds (ref 24–36)

## 2019-04-27 NOTE — Patient Instructions (Addendum)
Your procedure is scheduled on: Wed 05/04/19 Report to [facility]. To find out your arrival time please call [phone] between 1PM - 3PM on Tue 05/03/19.  Remember: Instructions that are not followed completely may result in serious medical risk, up to and including death, or upon the discretion of your surgeon and anesthesiologist your surgery may need to be rescheduled.     _X__ 1. Do not eat food after midnight the night before your procedure.                 No gum chewing or hard candies. You may drink clear liquids up to 2 hours                 before you are scheduled to arrive for your surgery- DO not drink clear                 liquids within 2 hours of the start of your surgery.                 Clear Liquids include:  water, apple juice without pulp, clear carbohydrate                 drink such as Clearfast or Gatorade, Black Coffee or Tea (Do not add                 anything to coffee or tea). Diabetics water only  __X__2.  On the morning of surgery brush your teeth with toothpaste and water, you                 may rinse your mouth with mouthwash if you wish.  Do not swallow any              toothpaste of mouthwash.     _X__ 3.  No Alcohol for 24 hours before or after surgery.   _X__ 4.  Do Not Smoke or use e-cigarettes For 24 Hours Prior to Your Surgery.                 Do not use any chewable tobacco products for at least 6 hours prior to                 surgery.  ____  5.  Bring all medications with you on the day of surgery if instructed.   __X__  6.  Notify your doctor if there is any change in your medical condition      (cold, fever, infections).     Do not wear jewelry, make-up, hairpins, clips or nail polish. Do not wear lotions, powders, or perfumes.  Do not shave 48 hours prior to surgery. Men may shave face and neck. Do not bring valuables to the hospital.    [facility] is not responsible for any  belongings or valuables.  Contacts, dentures/partials or body piercings may not be worn into surgery. Bring a case for your contacts, glasses or hearing aids, a denture cup will be supplied. Leave your suitcase in the car. After surgery it may be brought to your room. For patients admitted to the hospital, discharge time is determined by your treatment team.   Patients discharged the day of surgery will not be allowed to drive home.   Please read over the following fact sheets that you were given:   MRSA Information  __X__ Take these medicines the morning of surgery with A SIP OF WATER:  1. carvedilol (COREG  2. digoxin (LANOXIN)  3. loratadine (CLARITIN  4. tamsulosin (FLOMAX  5.  6.  ____ Fleet Enema (as directed)   __X__ Use CHG Soap/SAGE wipes as directed  ____ Use inhalers on the day of surgery  ____ Stop metformin/Janumet/Farxiga 2 days prior to surgery    ____ Take 1/2 of usual insulin dose the night before surgery. No insulin the morning          of surgery.   __X__ Stop Blood Thinners Coumadin/Plavix/Xarelto/Pleta/Pradaxa/Eliquis/Effient/Aspirin  on   Or contact your Surgeon, Cardiologist or Medical Doctor regarding  ability to stop your blood thinners  __X__ Stop Anti-inflammatories 7 days before surgery such as Advil, Ibuprofen, Motrin,  BC or Goodies Powder, Naprosyn, Naproxen, Aleve, MELOXICAM   __X__ Stop all herbal supplements, fish oil or vitamin E until after surgery.    ____ Bring C-Pap to the hospital.

## 2019-04-27 NOTE — Patient Instructions (Addendum)
Please review the attached list of medications and notify my office if there are any errors.   Please bring all of your medications to every appointment so we can make sure that our medication list is the same as yours.   It is especially important to get the annual flu vaccine this year. If you haven't had it already, please go to your pharmacy or call the office as soon as possible to schedule you flu shot. .  . Please review the attached list of medications and notify my office if there are any errors.   . Please bring all of your medications to every appointment so we can make sure that our medication list is the same as yours.   . It is especially important to get the annual flu vaccine this year. If you haven't had it already, please go to your pharmacy or call the office as soon as possible to schedule you flu shot.

## 2019-04-29 ENCOUNTER — Other Ambulatory Visit: Payer: Self-pay

## 2019-04-29 ENCOUNTER — Other Ambulatory Visit
Admission: RE | Admit: 2019-04-29 | Discharge: 2019-04-29 | Disposition: A | Discharge status: Home / Self Care | Payer: PPO | Source: Ambulatory Visit | Attending: Orthopedic Surgery | Admitting: Orthopedic Surgery

## 2019-04-29 DIAGNOSIS — Z01812 Encounter for preprocedural laboratory examination: Secondary | ICD-10-CM | POA: Diagnosis not present

## 2019-04-29 LAB — SARS CORONAVIRUS 2 (TAT 6-24 HRS): SARS Coronavirus 2: NEGATIVE

## 2019-05-03 MED ORDER — CEFAZOLIN SODIUM-DEXTROSE 2-4 GM/100ML-% IV SOLN
2.0000 g | INTRAVENOUS | Status: AC
  Administered 2019-05-04: 2 g via INTRAVENOUS

## 2019-05-03 MED ORDER — TRANEXAMIC ACID-NACL 1000-0.7 MG/100ML-% IV SOLN
1000.0000 mg | INTRAVENOUS | Status: AC
  Administered 2019-05-04: 1000 mg via INTRAVENOUS
  Filled 2019-05-03: qty 100

## 2019-05-04 ENCOUNTER — Encounter: Payer: Self-pay | Admitting: *Deleted

## 2019-05-04 ENCOUNTER — Inpatient Hospital Stay: Payer: PPO

## 2019-05-04 ENCOUNTER — Inpatient Hospital Stay: Payer: PPO | Admitting: Certified Registered Nurse Anesthetist

## 2019-05-04 ENCOUNTER — Encounter
Admission: RE | Disposition: A | Discharge status: Home / Self Care | Payer: Self-pay | Source: Home / Self Care | Attending: Orthopedic Surgery

## 2019-05-04 ENCOUNTER — Other Ambulatory Visit: Payer: Self-pay

## 2019-05-04 ENCOUNTER — Inpatient Hospital Stay
Admission: RE | Admit: 2019-05-04 | Discharge: 2019-05-05 | DRG: 470 | Disposition: A | Discharge status: Home / Self Care | Payer: PPO | Attending: Orthopedic Surgery | Admitting: Orthopedic Surgery

## 2019-05-04 DIAGNOSIS — Z419 Encounter for procedure for purposes other than remedying health state, unspecified: Secondary | ICD-10-CM

## 2019-05-04 DIAGNOSIS — Z96641 Presence of right artificial hip joint: Secondary | ICD-10-CM

## 2019-05-04 DIAGNOSIS — M1611 Unilateral primary osteoarthritis, right hip: Principal | ICD-10-CM | POA: Diagnosis present

## 2019-05-04 DIAGNOSIS — M879 Osteonecrosis, unspecified: Secondary | ICD-10-CM | POA: Diagnosis present

## 2019-05-04 DIAGNOSIS — Z471 Aftercare following joint replacement surgery: Secondary | ICD-10-CM | POA: Diagnosis not present

## 2019-05-04 DIAGNOSIS — I4821 Permanent atrial fibrillation: Secondary | ICD-10-CM | POA: Diagnosis present

## 2019-05-04 DIAGNOSIS — Z8546 Personal history of malignant neoplasm of prostate: Secondary | ICD-10-CM

## 2019-05-04 DIAGNOSIS — I10 Essential (primary) hypertension: Secondary | ICD-10-CM | POA: Diagnosis not present

## 2019-05-04 DIAGNOSIS — Z09 Encounter for follow-up examination after completed treatment for conditions other than malignant neoplasm: Secondary | ICD-10-CM

## 2019-05-04 DIAGNOSIS — Z79899 Other long term (current) drug therapy: Secondary | ICD-10-CM

## 2019-05-04 DIAGNOSIS — E785 Hyperlipidemia, unspecified: Secondary | ICD-10-CM | POA: Diagnosis present

## 2019-05-04 DIAGNOSIS — Z96642 Presence of left artificial hip joint: Secondary | ICD-10-CM | POA: Diagnosis present

## 2019-05-04 DIAGNOSIS — Z87891 Personal history of nicotine dependence: Secondary | ICD-10-CM | POA: Diagnosis not present

## 2019-05-04 DIAGNOSIS — Z791 Long term (current) use of non-steroidal anti-inflammatories (NSAID): Secondary | ICD-10-CM | POA: Diagnosis not present

## 2019-05-04 HISTORY — PX: TOTAL HIP ARTHROPLASTY: SHX124

## 2019-05-04 LAB — ABO/RH: ABO/RH(D): O NEG

## 2019-05-04 SURGERY — ARTHROPLASTY, HIP, TOTAL, ANTERIOR APPROACH
Anesthesia: Spinal | Laterality: Right

## 2019-05-04 MED ORDER — FENTANYL CITRATE (PF) 100 MCG/2ML IJ SOLN
INTRAMUSCULAR | Status: DC | PRN
  Administered 2019-05-04 (×2): 50 ug via INTRAVENOUS

## 2019-05-04 MED ORDER — KETOROLAC TROMETHAMINE 15 MG/ML IJ SOLN
15.0000 mg | Freq: Four times a day (QID) | INTRAMUSCULAR | Status: DC
  Administered 2019-05-04 – 2019-05-05 (×3): 15 mg via INTRAVENOUS
  Filled 2019-05-04 (×3): qty 1

## 2019-05-04 MED ORDER — METHOCARBAMOL 1000 MG/10ML IJ SOLN
500.0000 mg | Freq: Four times a day (QID) | INTRAVENOUS | Status: DC | PRN
  Filled 2019-05-04: qty 5

## 2019-05-04 MED ORDER — CARVEDILOL 25 MG PO TABS
25.0000 mg | ORAL_TABLET | Freq: Two times a day (BID) | ORAL | Status: DC
  Administered 2019-05-05: 25 mg via ORAL
  Filled 2019-05-04: qty 1

## 2019-05-04 MED ORDER — DOCUSATE SODIUM 100 MG PO CAPS
100.0000 mg | ORAL_CAPSULE | Freq: Two times a day (BID) | ORAL | Status: DC
  Administered 2019-05-04 – 2019-05-05 (×2): 100 mg via ORAL
  Filled 2019-05-04 (×2): qty 1

## 2019-05-04 MED ORDER — ZOLPIDEM TARTRATE 5 MG PO TABS: 5.0000 mg | ORAL_TABLET | Freq: Every evening | ORAL | Status: DC | PRN

## 2019-05-04 MED ORDER — CELECOXIB 200 MG PO CAPS
400.0000 mg | ORAL_CAPSULE | Freq: Once | ORAL | Status: AC
  Administered 2019-05-04: 07:00:00 400 mg via ORAL

## 2019-05-04 MED ORDER — PROPOFOL 500 MG/50ML IV EMUL
INTRAVENOUS | Status: DC | PRN
  Administered 2019-05-04: 50 ug/kg/min via INTRAVENOUS

## 2019-05-04 MED ORDER — LACTATED RINGERS IV SOLN
INTRAVENOUS | Status: DC
  Administered 2019-05-04 (×2): via INTRAVENOUS

## 2019-05-04 MED ORDER — PROPOFOL 500 MG/50ML IV EMUL
INTRAVENOUS | Status: AC
  Filled 2019-05-04: qty 50

## 2019-05-04 MED ORDER — BISACODYL 10 MG RE SUPP: 10.0000 mg | Freq: Every day | RECTAL | Status: DC | PRN

## 2019-05-04 MED ORDER — ALUM & MAG HYDROXIDE-SIMETH 200-200-20 MG/5ML PO SUSP: 30.0000 mL | ORAL | Status: DC | PRN

## 2019-05-04 MED ORDER — GLYCOPYRROLATE 0.2 MG/ML IJ SOLN
INTRAMUSCULAR | Status: AC
  Administered 2019-05-04: 0.4 mg via INTRAVENOUS
  Filled 2019-05-04: qty 2

## 2019-05-04 MED ORDER — ONDANSETRON HCL 4 MG PO TABS: 4.0000 mg | ORAL_TABLET | Freq: Four times a day (QID) | ORAL | Status: DC | PRN

## 2019-05-04 MED ORDER — DIPHENHYDRAMINE HCL 12.5 MG/5ML PO ELIX: 12.5000 mg | ORAL_SOLUTION | ORAL | Status: DC | PRN

## 2019-05-04 MED ORDER — TRAMADOL HCL 50 MG PO TABS
50.0000 mg | ORAL_TABLET | Freq: Four times a day (QID) | ORAL | Status: DC
  Administered 2019-05-04 – 2019-05-05 (×3): 50 mg via ORAL
  Filled 2019-05-04 (×3): qty 1

## 2019-05-04 MED ORDER — SODIUM CHLORIDE 0.9 % IV SOLN
INTRAVENOUS | Status: DC | PRN
  Administered 2019-05-04: 25 ug/min via INTRAVENOUS

## 2019-05-04 MED ORDER — TAMSULOSIN HCL 0.4 MG PO CAPS
0.4000 mg | ORAL_CAPSULE | Freq: Every day | ORAL | Status: DC
  Administered 2019-05-05: 0.4 mg via ORAL
  Filled 2019-05-04: qty 1

## 2019-05-04 MED ORDER — IRBESARTAN 150 MG PO TABS
300.0000 mg | ORAL_TABLET | Freq: Every day | ORAL | Status: DC
  Administered 2019-05-05: 300 mg via ORAL
  Filled 2019-05-04: qty 2

## 2019-05-04 MED ORDER — SUCCINYLCHOLINE CHLORIDE 20 MG/ML IJ SOLN
INTRAMUSCULAR | Status: AC
  Filled 2019-05-04: qty 1

## 2019-05-04 MED ORDER — MAGNESIUM CITRATE PO SOLN
1.0000 | Freq: Once | ORAL | Status: DC | PRN
  Filled 2019-05-04: qty 296

## 2019-05-04 MED ORDER — ACETAMINOPHEN 325 MG PO TABS: 325.0000 mg | ORAL_TABLET | Freq: Four times a day (QID) | ORAL | Status: DC | PRN

## 2019-05-04 MED ORDER — GABAPENTIN 300 MG PO CAPS
300.0000 mg | ORAL_CAPSULE | Freq: Once | ORAL | Status: AC
  Administered 2019-05-04: 07:00:00 300 mg via ORAL

## 2019-05-04 MED ORDER — IRBESARTAN-HYDROCHLOROTHIAZIDE 300-12.5 MG PO TABS: 1.0000 | ORAL_TABLET | Freq: Every day | ORAL | Status: DC

## 2019-05-04 MED ORDER — MORPHINE SULFATE (PF) 2 MG/ML IV SOLN: 0.5000 mg | INTRAVENOUS | Status: DC | PRN

## 2019-05-04 MED ORDER — MENTHOL 3 MG MT LOZG
1.0000 | LOZENGE | OROMUCOSAL | Status: DC | PRN
  Filled 2019-05-04: qty 9

## 2019-05-04 MED ORDER — DIGOXIN 125 MCG PO TABS
125.0000 ug | ORAL_TABLET | Freq: Every day | ORAL | Status: DC
  Administered 2019-05-05: 125 ug via ORAL
  Filled 2019-05-04: qty 1

## 2019-05-04 MED ORDER — METOCLOPRAMIDE HCL 10 MG PO TABS: 5.0000 mg | ORAL_TABLET | Freq: Three times a day (TID) | ORAL | Status: DC | PRN

## 2019-05-04 MED ORDER — ACETAMINOPHEN 500 MG PO TABS
500.0000 mg | ORAL_TABLET | Freq: Four times a day (QID) | ORAL | Status: DC
  Administered 2019-05-04 – 2019-05-05 (×3): 500 mg via ORAL
  Filled 2019-05-04 (×3): qty 1

## 2019-05-04 MED ORDER — BUPIVACAINE-EPINEPHRINE (PF) 0.25% -1:200000 IJ SOLN
INTRAMUSCULAR | Status: DC | PRN
  Administered 2019-05-04: 20 mL

## 2019-05-04 MED ORDER — CELECOXIB 200 MG PO CAPS
ORAL_CAPSULE | ORAL | Status: AC
  Administered 2019-05-04: 400 mg via ORAL
  Filled 2019-05-04: qty 2

## 2019-05-04 MED ORDER — ASPIRIN 81 MG PO CHEW
81.0000 mg | CHEWABLE_TABLET | Freq: Two times a day (BID) | ORAL | Status: DC
  Administered 2019-05-04 – 2019-05-05 (×2): 81 mg via ORAL
  Filled 2019-05-04 (×2): qty 1

## 2019-05-04 MED ORDER — GABAPENTIN 300 MG PO CAPS
ORAL_CAPSULE | ORAL | Status: AC
  Administered 2019-05-04: 300 mg via ORAL
  Filled 2019-05-04: qty 1

## 2019-05-04 MED ORDER — PHENYLEPHRINE HCL (PRESSORS) 10 MG/ML IV SOLN
INTRAVENOUS | Status: AC
  Filled 2019-05-04: qty 1

## 2019-05-04 MED ORDER — CEFAZOLIN SODIUM-DEXTROSE 2-4 GM/100ML-% IV SOLN
2.0000 g | Freq: Four times a day (QID) | INTRAVENOUS | Status: AC
  Administered 2019-05-04 – 2019-05-05 (×2): 2 g via INTRAVENOUS
  Filled 2019-05-04 (×2): qty 100

## 2019-05-04 MED ORDER — HYDROCODONE-ACETAMINOPHEN 5-325 MG PO TABS: 1.0000 | ORAL_TABLET | ORAL | Status: DC | PRN

## 2019-05-04 MED ORDER — BUPIVACAINE-EPINEPHRINE (PF) 0.25% -1:200000 IJ SOLN
INTRAMUSCULAR | Status: AC
  Filled 2019-05-04: qty 30

## 2019-05-04 MED ORDER — HYDROCODONE-ACETAMINOPHEN 7.5-325 MG PO TABS: 1.0000 | ORAL_TABLET | ORAL | Status: DC | PRN

## 2019-05-04 MED ORDER — BACITRACIN 50000 UNITS IM SOLR
INTRAMUSCULAR | Status: AC
  Filled 2019-05-04: qty 1

## 2019-05-04 MED ORDER — BUPIVACAINE HCL (PF) 0.5 % IJ SOLN
INTRAMUSCULAR | Status: DC | PRN
  Administered 2019-05-04: 3 mL

## 2019-05-04 MED ORDER — ACETAMINOPHEN 500 MG PO TABS
1000.0000 mg | ORAL_TABLET | Freq: Once | ORAL | Status: AC
  Administered 2019-05-04: 07:00:00 1000 mg via ORAL

## 2019-05-04 MED ORDER — MIDAZOLAM HCL 2 MG/2ML IJ SOLN
INTRAMUSCULAR | Status: AC
  Filled 2019-05-04: qty 2

## 2019-05-04 MED ORDER — LACTATED RINGERS IV SOLN
INTRAVENOUS | Status: DC
  Administered 2019-05-04: 16:00:00 via INTRAVENOUS

## 2019-05-04 MED ORDER — CEFAZOLIN SODIUM-DEXTROSE 2-4 GM/100ML-% IV SOLN
INTRAVENOUS | Status: AC
  Filled 2019-05-04: qty 100

## 2019-05-04 MED ORDER — LACTATED RINGERS IV BOLUS
500.0000 mL | Freq: Once | INTRAVENOUS | Status: AC
  Administered 2019-05-04: 500 mL via INTRAVENOUS

## 2019-05-04 MED ORDER — HYDROCHLOROTHIAZIDE 25 MG PO TABS
25.0000 mg | ORAL_TABLET | Freq: Every day | ORAL | Status: DC
  Administered 2019-05-05: 25 mg via ORAL
  Filled 2019-05-04: qty 1

## 2019-05-04 MED ORDER — POVIDONE-IODINE 10 % EX SWAB: 2.0000 "application " | Freq: Once | CUTANEOUS | Status: DC

## 2019-05-04 MED ORDER — ONDANSETRON HCL 4 MG/2ML IJ SOLN: 4.0000 mg | Freq: Once | INTRAMUSCULAR | Status: DC | PRN

## 2019-05-04 MED ORDER — MAGNESIUM HYDROXIDE 400 MG/5ML PO SUSP: 30.0000 mL | Freq: Every day | ORAL | Status: DC | PRN

## 2019-05-04 MED ORDER — SODIUM CHLORIDE FLUSH 0.9 % IV SOLN
INTRAVENOUS | Status: AC
  Filled 2019-05-04: qty 20

## 2019-05-04 MED ORDER — BACITRACIN 50000 UNITS IM SOLR
INTRAMUSCULAR | Status: AC
  Filled 2019-05-04: qty 2

## 2019-05-04 MED ORDER — PHENOL 1.4 % MT LIQD
1.0000 | OROMUCOSAL | Status: DC | PRN
  Filled 2019-05-04: qty 177

## 2019-05-04 MED ORDER — FENTANYL CITRATE (PF) 100 MCG/2ML IJ SOLN: 25.0000 ug | INTRAMUSCULAR | Status: DC | PRN

## 2019-05-04 MED ORDER — ACETAMINOPHEN 500 MG PO TABS
ORAL_TABLET | ORAL | Status: AC
  Administered 2019-05-04: 1000 mg via ORAL
  Filled 2019-05-04: qty 2

## 2019-05-04 MED ORDER — SODIUM CHLORIDE FLUSH 0.9 % IV SOLN
INTRAVENOUS | Status: AC
  Filled 2019-05-04: qty 10

## 2019-05-04 MED ORDER — PRAVASTATIN SODIUM 20 MG PO TABS
20.0000 mg | ORAL_TABLET | Freq: Every day | ORAL | Status: DC
  Administered 2019-05-04: 20 mg via ORAL
  Filled 2019-05-04: qty 1

## 2019-05-04 MED ORDER — CHLORHEXIDINE GLUCONATE 4 % EX LIQD: 60.0000 mL | Freq: Once | CUTANEOUS | Status: DC

## 2019-05-04 MED ORDER — ONDANSETRON HCL 4 MG/2ML IJ SOLN: 4.0000 mg | Freq: Four times a day (QID) | INTRAMUSCULAR | Status: DC | PRN

## 2019-05-04 MED ORDER — METOCLOPRAMIDE HCL 5 MG/ML IJ SOLN: 5.0000 mg | Freq: Three times a day (TID) | INTRAMUSCULAR | Status: DC | PRN

## 2019-05-04 MED ORDER — LORATADINE 10 MG PO TABS
10.0000 mg | ORAL_TABLET | Freq: Every day | ORAL | Status: DC
  Administered 2019-05-05: 10 mg via ORAL
  Filled 2019-05-04: qty 1

## 2019-05-04 MED ORDER — GLYCOPYRROLATE 0.2 MG/ML IJ SOLN
0.4000 mg | Freq: Once | INTRAMUSCULAR | Status: AC
  Administered 2019-05-04: 11:00:00 0.4 mg via INTRAVENOUS

## 2019-05-04 MED ORDER — MIDAZOLAM HCL 5 MG/5ML IJ SOLN
INTRAMUSCULAR | Status: DC | PRN
  Administered 2019-05-04: 2 mg via INTRAVENOUS

## 2019-05-04 MED ORDER — FENTANYL CITRATE (PF) 100 MCG/2ML IJ SOLN
INTRAMUSCULAR | Status: AC
  Filled 2019-05-04: qty 2

## 2019-05-04 MED ORDER — SODIUM CHLORIDE 0.9 % IR SOLN
Status: DC | PRN
  Administered 2019-05-04: 1000 mL
  Administered 2019-05-04: 09:00:00 500 mL

## 2019-05-04 MED ORDER — METHOCARBAMOL 500 MG PO TABS: 500.0000 mg | ORAL_TABLET | Freq: Four times a day (QID) | ORAL | Status: DC | PRN

## 2019-05-04 MED ORDER — SODIUM CHLORIDE (PF) 0.9 % IJ SOLN
INTRAMUSCULAR | Status: DC | PRN
  Administered 2019-05-04: 20 mL

## 2019-05-04 SURGICAL SUPPLY — 49 items
BLADE SAGITTAL WIDE XTHICK NO (BLADE) ×2 IMPLANT
BRUSH SCRUB EZ  4% CHG (MISCELLANEOUS) ×2
BRUSH SCRUB EZ 4% CHG (MISCELLANEOUS) ×2 IMPLANT
CHLORAPREP W/TINT 26 (MISCELLANEOUS) ×2 IMPLANT
COVER HOLE (Hips) ×2 IMPLANT
COVER WAND RF STERILE (DRAPES) ×2 IMPLANT
CUP R3 54MM 3 HOLE (Hips) ×2 IMPLANT
DRAPE 3/4 80X56 (DRAPES) ×2 IMPLANT
DRAPE C-ARM 42X72 X-RAY (DRAPES) ×2 IMPLANT
DRAPE IMP U-DRAPE 54X76 (DRAPES) ×2 IMPLANT
DRAPE STERI IOBAN 125X83 (DRAPES) IMPLANT
DRSG AQUACEL AG ADV 3.5X10 (GAUZE/BANDAGES/DRESSINGS) ×2 IMPLANT
DRSG AQUACEL AG ADV 3.5X14 (GAUZE/BANDAGES/DRESSINGS) IMPLANT
ELECT BLADE 6.5 EXT (BLADE) ×2 IMPLANT
ELECT REM PT RETURN 9FT ADLT (ELECTROSURGICAL) ×2
ELECTRODE REM PT RTRN 9FT ADLT (ELECTROSURGICAL) ×1 IMPLANT
GAUZE XEROFORM 1X8 LF (GAUZE/BANDAGES/DRESSINGS) ×2 IMPLANT
GLOVE INDICATOR 8.0 STRL GRN (GLOVE) ×6 IMPLANT
GLOVE SURG ORTHO 8.0 STRL STRW (GLOVE) ×6 IMPLANT
GOWN STRL REUS W/ TWL LRG LVL3 (GOWN DISPOSABLE) ×3 IMPLANT
GOWN STRL REUS W/ TWL XL LVL3 (GOWN DISPOSABLE) ×3 IMPLANT
GOWN STRL REUS W/TWL LRG LVL3 (GOWN DISPOSABLE) ×3
GOWN STRL REUS W/TWL XL LVL3 (GOWN DISPOSABLE) ×3
HEAD FEMORAL TAPER 36MM P0 (Head) ×2 IMPLANT
HOOD PEEL AWAY FLYTE STAYCOOL (MISCELLANEOUS) ×8 IMPLANT
IV NS 1000ML (IV SOLUTION) ×1
IV NS 1000ML BAXH (IV SOLUTION) ×1 IMPLANT
KIT PATIENT CARE HANA TABLE (KITS) ×2 IMPLANT
KIT TURNOVER CYSTO (KITS) ×2 IMPLANT
LINER ACETABULAR 36X54 OD (Liner) ×2 IMPLANT
MAT ABSORB  FLUID 56X50 GRAY (MISCELLANEOUS) ×1
MAT ABSORB FLUID 56X50 GRAY (MISCELLANEOUS) ×1 IMPLANT
NDL SAFETY ECLIPSE 18X1.5 (NEEDLE) ×2 IMPLANT
NEEDLE HYPO 18GX1.5 SHARP (NEEDLE) ×2
NEEDLE HYPO 22GX1.5 SAFETY (NEEDLE) ×2 IMPLANT
NEEDLE SPNL 20GX3.5 QUINCKE YW (NEEDLE) ×2 IMPLANT
PACK HIP PROSTHESIS (MISCELLANEOUS) ×2 IMPLANT
PADDING CAST BLEND 4X4 NS (MISCELLANEOUS) ×4 IMPLANT
PILLOW ABDUCTION MEDIUM (MISCELLANEOUS) ×2 IMPLANT
PULSAVAC PLUS IRRIG FAN TIP (DISPOSABLE) ×2
SCREW 6.5X25MM (Screw) ×2 IMPLANT
STAPLER SKIN PROX 35W (STAPLE) ×2 IMPLANT
STEM STD COLLAR SZ5 POLARSTEM (Stem) ×2 IMPLANT
SUT BONE WAX W31G (SUTURE) ×2 IMPLANT
SUT DVC 2 QUILL PDO  T11 36X36 (SUTURE) ×1
SUT DVC 2 QUILL PDO T11 36X36 (SUTURE) ×1 IMPLANT
SUT VIC AB 2-0 CT1 18 (SUTURE) ×2 IMPLANT
SYR 20ML LL LF (SYRINGE) ×2 IMPLANT
TIP FAN IRRIG PULSAVAC PLUS (DISPOSABLE) ×1 IMPLANT

## 2019-05-04 NOTE — Progress Notes (Signed)
Bladder scan 289cc.  Able to bend knees and push foot forward against examiners hands.   No pain VS stable.  Pulse fluctuates due to A fib.  Transferred to room 153

## 2019-05-04 NOTE — Anesthesia Procedure Notes (Signed)
Spinal  Patient location during procedure: OR Staffing Anesthesiologist: Adams, James G, MD Resident/CRNA: Beane, Catherine, CRNA Performed: resident/CRNA  Preanesthetic Checklist Completed: patient identified, site marked, surgical consent, pre-op evaluation, timeout performed, IV checked, risks and benefits discussed and monitors and equipment checked Spinal Block Patient position: sitting Prep: Betadine Patient monitoring: heart rate, continuous pulse ox, blood pressure and cardiac monitor Approach: midline Location: L3-4 Injection technique: single-shot Needle Needle type: Quincke  Needle gauge: 22 G Needle length: 9 cm Additional Notes Negative paresthesia. Negative blood return. Positive free-flowing CSF. Expiration date of kit checked and confirmed. Patient tolerated procedure well, without complications.       

## 2019-05-04 NOTE — Transfer of Care (Signed)
Immediate Anesthesia Transfer of Care Note  Patient: Steven Saunders.  Procedure(s) Performed: TOTAL HIP ARTHROPLASTY ANTERIOR APPROACH (Right )  Patient Location: PACU  Anesthesia Type:Spinal  Level of Consciousness: awake and alert   Airway & Oxygen Therapy: Patient Spontanous Breathing and Patient connected to face mask oxygen  Post-op Assessment: Report given to RN and Post -op Vital signs reviewed and stable  Post vital signs: Reviewed and stable  Last Vitals:  Vitals Value Taken Time  BP 112/75 05/04/19 1052  Temp    Pulse 76 05/04/19 1052  Resp 14 05/04/19 1052  SpO2 98 % 05/04/19 1052  Vitals shown include unvalidated device data.  Last Pain:  Vitals:   05/04/19 0702  TempSrc: Tympanic  PainSc: 6          Complications: No apparent anesthesia complications

## 2019-05-04 NOTE — Anesthesia Preprocedure Evaluation (Signed)
Anesthesia Evaluation  Patient identified by MRN, date of birth, ID band Patient awake    Reviewed: Allergy & Precautions, H&P , NPO status , Patient's Chart, lab work & pertinent test results, reviewed documented beta blocker date and time   Airway Mallampati: II   Neck ROM: full    Dental  (+) Poor Dentition   Pulmonary neg pulmonary ROS, former smoker,    Pulmonary exam normal        Cardiovascular Exercise Tolerance: Poor hypertension, On Medications + dysrhythmias Atrial Fibrillation  Rhythm:regular Rate:Normal     Neuro/Psych negative neurological ROS  negative psych ROS   GI/Hepatic negative GI ROS, Neg liver ROS,   Endo/Other  negative endocrine ROS  Renal/GU negative Renal ROS  negative genitourinary   Musculoskeletal   Abdominal   Peds  Hematology negative hematology ROS (+)   Anesthesia Other Findings Past Medical History: No date: Cancer ([place])     Comment:  prostate No date: Hypertension No date: Permanent atrial fibrillation Past Surgical History: No date: APPENDECTOMY 2007: CARDIAC CATHETERIZATION 05/26/2017: COLONOSCOPY WITH PROPOFOL; N/A     Comment:  Procedure: COLONOSCOPY WITH PROPOFOL;  Surgeon: [name], MD;  Location: [facility] ENDOSCOPY;  Service:               Endoscopy;  Laterality: N/A; No date: HERNIA REPAIR     Comment:  1950's x 2 2014: JOINT REPLACEMENT; Left No date: PROSTATE BIOPSY 1980: thumb surgery     Comment:  had staph infection and had to cut out the area 2014: [place]; Left     Comment:  Dr. Sabra Heck BMI    Body Mass Index: 33.49 kg/m     Reproductive/Obstetrics negative OB ROS                             Anesthesia Physical Anesthesia Plan  ASA: III  Anesthesia Plan: Spinal   Post-op Pain Management:    Induction:   PONV Risk Score and Plan:   Airway Management Planned:   Additional Equipment:    Intra-op Plan:   Post-operative Plan:   Informed Consent: I have reviewed the patients History and Physical, chart, labs and discussed the procedure including the risks, benefits and alternatives for the proposed anesthesia with the patient or authorized representative who has indicated his/her understanding and acceptance.     Dental Advisory Given  Plan Discussed with: CRNA  Anesthesia Plan Comments:         Anesthesia Quick Evaluation

## 2019-05-04 NOTE — Anesthesia Post-op Follow-up Note (Signed)
Anesthesia QCDR form completed.        

## 2019-05-04 NOTE — Op Note (Signed)
05/04/2019  10:31 AM  PATIENT:  Steven Saunders.   MRN: [id number]  PRE-OPERATIVE DIAGNOSIS:  Osteoarthritis right hip   POST-OPERATIVE DIAGNOSIS: Same  Procedure: Right Total Hip Replacement  Surgeon: Elyn Aquas. Harlow Mares, MD   Assist: Carlynn Spry, PA-C  Anesthesia: Spinal   EBL: 200 mL   Specimens: None   Drains: None   Components used: A size 5 Polarstem Smith [employer]hew, R3 size 54 mm shell, and a 36 mm by +0 mm head    Description of the procedure in detail: After informed consent was obtained and the appropriate extremity marked in the pre-operative holding area, the patient was taken to the operating room and placed in the supine position on the fracture table. All pressure points were well padded and bilateral lower extremities were place in traction spars. The hip was prepped and draped in standard sterile fashion. A spinal anesthetic had been delivered by the anesthesia team. The skin and subcutaneous tissues were injected with a mixture of Marcaine with epinephrine for post-operative pain. A longitudinal incision approximately 10 cm in length was carried out from the anterior superior iliac spine to the greater trochanter. The tensor fascia was divided and blunt dissection was taken down to the level of the joint capsule. The lateral circumflex vessels were cauterized. Deep retractors were placed and a portion of the anterior capsule was excised. Using fluoroscopy the neck cut was planned and carried out with a sagittal saw. The head was passed from the field with use of a corkscrew and hip skid. Deep retractors were placed along the acetabulum and the degenerative labrum and large osteophytes were removed with a Rongeur. The cup was sequentially reamed to a size 54 mm. The wound was irrigated and using fluoroscopy the size 54 mm cup was impacted in to anatomic position. A single screw was placed followed by a threaded hole cover. The final liner was impacted in to position.  Attention was then turned to the proximal femur. The leg was placed in extension and external rotation. The canal was opened and sequentially broached to a size 5. The trial components were placed and the hip relocated. The components were found to be in good position using fluoroscopy. The hip was dislocated and the trial components removed. The final components were impacted in to position and the hip relocated. The final components were again check with fluoroscopy and found to be in good position. Hemostasis was achieved with electrocautery. The deep capsule was injected with Marcaine and epinephrine. The wound was irrigated with bacitracin laced normal saline and the tensor fascia closed with #2 Quill suture. The subcutaneous tissues were closed with 2-0 vicryl and staples for the skin. A sterile dressing was applied and an abduction pillow. Patient tolerated the procedure well and there were no apparent complication. Patient was taken to the recovery room in good condition.   Kurtis Bushman, MD

## 2019-05-04 NOTE — Evaluation (Signed)
Physical Therapy Evaluation Patient Details Name: Steven Saunders. MRN: [id number] DOB: 11/22/1950 Today's Date: 05/04/2019   History of Present Illness  pt is a 68 yo male s/p right THR direct anterior approach on 05/04/19. PMH includes HTN, permanent Afib, prostate cancer, left total hip replacement (2014)  Clinical Impression  Pt is a pleasant 68 yo male admitted for total hip replacement. Pt in bed upon arrival and eager to participate with PT. Pt educated on precautions and home exercise program with pt verbalizing understanding. Pt performed therex requiring min cuing for correct performance and no reports of increased pain. Pt ambulated one lap around nurses station and to/from restroom with min guard assist and RW. Pt presents with decreased strength, endurance, balance and pain consistent with recent surgery. PT will progress to stair training tomorrow. Pt would benefit from skilled acute therapy to improve deficits and home health PT post discharge to improve mobility and allow for return to PLOF.     Follow Up Recommendations Home health PT    Equipment Recommendations  None recommended by PT    Recommendations for Other Services       Precautions / Restrictions Precautions Precautions: Anterior Hip Precaution Booklet Issued: Yes (comment) Restrictions Weight Bearing Restrictions: Yes RLE Weight Bearing: Weight bearing as tolerated      Mobility  Bed Mobility Overal bed mobility: Needs Assistance Bed Mobility: Supine to Sit     Supine to sit: Min guard;HOB elevated     General bed mobility comments: pt required increased time but no physical assist required to sit EOB  Transfers Overall transfer level: Needs assistance Equipment used: Rolling walker (2 wheeled) Transfers: Sit to/from Stand Sit to Stand: Min guard         General transfer comment: x2 trials from bed and recliner, pt did not require physical assist to rise, steady with standing, cuing for hand  placement with good carryover on second sit to stand transfer, increased reliance on UE support from RW with standing  Ambulation/Gait Ambulation/Gait assistance: Min guard Gait Distance (Feet): 180 Feet Assistive device: Rolling walker (2 wheeled) Gait Pattern/deviations: Step-through pattern;Decreased stance time - right;Decreased weight shift to right;Decreased step length - right;Trunk flexed;Antalgic Gait velocity: decreased   General Gait Details: pt with antalgic and slow gait pattern however steady throughout and no LOB, heavy use of UE on RW, pt required 2 standing rest breaks however had no increases in pain throughout ambulation around nurses station  Stairs            Wheelchair Mobility    Modified Rankin (Stroke Patients Only)       Balance Overall balance assessment: Mild deficits observed, not formally tested(pt reliant on UE support in standing but able to maintain standing balance with single UE support in order to finish donning shorts)                                           Pertinent Vitals/Pain Pain Assessment: Faces Faces Pain Scale: Hurts a little bit Pain Location: operative site, low back Pain Descriptors / Indicators: Aching;Sore Pain Intervention(s): Limited activity within patient's tolerance;Monitored during session;Repositioned    Home Living Family/patient expects to be discharged to:: Private residence Living Arrangements: Spouse/significant other Available Help at Discharge: Family;Available PRN/intermittently(spouse works 3x/week) Type of Home: House Home Access: Stairs to enter Entrance Stairs-Rails: Can reach both [employer] of Steps:  4 Home Layout: One level Home Equipment: Walker - 2 wheels;Cane - single point;Shower seat - built in;Hand held shower head      Prior Function Level of Independence: Independent         Comments: pt reports independent with all ADLs/IADLs, pt reports in the last  week having to use walker and cane for ambulation particularly in the mornings when he has increased stiffness     Hand Dominance        Extremity/Trunk Assessment   Upper Extremity Assessment Upper Extremity Assessment: Overall WFL for tasks assessed    Lower Extremity Assessment Lower Extremity Assessment: RLE deficits/detail RLE Deficits / Details: decreased strength, ROM, balance consistent with recent total hip replacement    Cervical / Trunk Assessment Cervical / Trunk Assessment: Kyphotic  Communication   Communication: No difficulties  Cognition Arousal/Alertness: Awake/alert Behavior During Therapy: WFL for tasks assessed/performed Overall Cognitive Status: Within Functional Limits for tasks assessed                                        General Comments      Exercises Total Joint Exercises Ankle Circles/Pumps: AROM;Both;20 reps Quad Sets: AROM;Right;10 reps Gluteal Sets: AROM;Both;10 reps Towel Squeeze: AROM;Both;10 reps Hip ABduction/ADduction: AROM;Right;5 reps;Supine Straight Leg Raises: AROM;Right;5 reps;Supine   Assessment/Plan    PT Assessment Patient needs continued PT services  PT Problem List Decreased strength;Decreased mobility;Decreased range of motion;Decreased activity tolerance;Decreased balance;Decreased knowledge of use of DME;Pain;Decreased knowledge of precautions       PT Treatment Interventions DME instruction;Therapeutic exercise;Gait training;Balance training;Stair training;Neuromuscular re-education;Functional mobility training;Therapeutic activities;Patient/family education    PT Goals (Current goals can be found in the Care Plan section)  Acute Rehab PT Goals Patient Stated Goal: return home and back to ambulating with less pain PT Goal Formulation: With patient Time For Goal Achievement: 05/11/19 Potential to Achieve Goals: Good    Frequency BID   Barriers to discharge Decreased caregiver support       Co-evaluation               AM-PAC PT "6 Clicks" Mobility  Outcome Measure Help needed turning from your back to your side while in a flat bed without using bedrails?: A Little Help needed moving from lying on your back to sitting on the side of a flat bed without using bedrails?: A Little Help needed moving to and from a bed to a chair (including a wheelchair)?: A Little Help needed standing up from a chair using your arms (e.g., wheelchair or bedside chair)?: A Little Help needed to walk in hospital room?: A Little Help needed climbing 3-5 steps with a railing? : A Little 6 Click Score: 18    End of Session Equipment Utilized During Treatment: Gait belt Activity Tolerance: Patient tolerated treatment well Patient left: in chair;with call bell/phone within reach;with SCD's reapplied;Other (comment)(hip adduction pillow in place) Nurse Communication: Mobility status PT Visit Diagnosis: Other abnormalities of gait and mobility (R26.89);Difficulty in walking, not elsewhere classified (R26.2)    Time: [id number] PT Time Calculation (min) (ACUTE ONLY): 48 min   Charges:   PT Evaluation $PT Eval Moderate Complexity: 1 Mod PT Treatments $Therapeutic Exercise: 8-22 mins        Honestie Kulik PT, DPT 5:17 PM,05/04/19 [phone]

## 2019-05-04 NOTE — H&P (Signed)
The patient has been re-examined, and the chart reviewed, and there have been no interval changes to the documented history and physical.  Plan a right total hip replacement today.  Anesthesia is not consulted regarding a peripheral nerve block for post-operative pain.  The risks, benefits, and alternatives have been discussed at length, and the patient is willing to proceed.     

## 2019-05-05 ENCOUNTER — Encounter: Payer: Self-pay | Admitting: Orthopedic Surgery

## 2019-05-05 LAB — CBC
HCT: 34.4 % — ABNORMAL LOW (ref 39.0–52.0)
Hemoglobin: 12.5 g/dL — ABNORMAL LOW (ref 13.0–17.0)
MCH: 34.9 pg — ABNORMAL HIGH (ref 26.0–34.0)
MCHC: 36.3 g/dL — ABNORMAL HIGH (ref 30.0–36.0)
MCV: 96.1 fL (ref 80.0–100.0)
Platelets: 135 10*3/uL — ABNORMAL LOW (ref 150–400)
RBC: 3.58 MIL/uL — ABNORMAL LOW (ref 4.22–5.81)
RDW: 12.7 % (ref 11.5–15.5)
WBC: 9.7 10*3/uL (ref 4.0–10.5)
nRBC: 0 % (ref 0.0–0.2)

## 2019-05-05 LAB — BASIC METABOLIC PANEL
Anion gap: 10 (ref 5–15)
BUN: 12 mg/dL (ref 8–23)
CO2: 26 mmol/L (ref 22–32)
Calcium: 8.6 mg/dL — ABNORMAL LOW (ref 8.9–10.3)
Chloride: 95 mmol/L — ABNORMAL LOW (ref 98–111)
Creatinine, Ser: 0.67 mg/dL (ref 0.61–1.24)
GFR calc Af Amer: >60 mL/min (ref >60)
GFR calc non Af Amer: >60 mL/min (ref >60)
Glucose, Bld: 138 mg/dL — ABNORMAL HIGH (ref 70–99)
Potassium: 3.2 mmol/L — ABNORMAL LOW (ref 3.5–5.1)
Sodium: 131 mmol/L — ABNORMAL LOW (ref 135–145)

## 2019-05-05 LAB — SURGICAL PATHOLOGY

## 2019-05-05 MED ORDER — HYDROCODONE-ACETAMINOPHEN 5-325 MG PO TABS: 1.0000 | ORAL_TABLET | ORAL | 0 refills | Status: DC | PRN

## 2019-05-05 MED ORDER — ASPIRIN 81 MG PO CHEW: 81.0000 mg | CHEWABLE_TABLET | Freq: Two times a day (BID) | ORAL | 0 refills | Status: DC

## 2019-05-05 MED ORDER — DOCUSATE SODIUM 100 MG PO CAPS: 100.0000 mg | ORAL_CAPSULE | Freq: Two times a day (BID) | ORAL | 0 refills | Status: DC

## 2019-05-05 NOTE — Anesthesia Postprocedure Evaluation (Signed)
Anesthesia Post Note  Patient: Steven Saunders.  Procedure(s) Performed: TOTAL HIP ARTHROPLASTY ANTERIOR APPROACH (Right )  Patient location during evaluation: Nursing Unit Anesthesia Type: Spinal Level of consciousness: oriented and awake and alert Pain management: pain level controlled Vital Signs Assessment: post-procedure vital signs reviewed and stable Respiratory status: spontaneous breathing and respiratory function stable Cardiovascular status: blood pressure returned to baseline and stable Postop Assessment: no headache, no backache, no apparent nausea or vomiting and patient able to bend at knees Anesthetic complications: no     Last Vitals:  Vitals:   05/04/19 2331 05/05/19 0334  BP: 133/69 (!) 158/97  Pulse: 81 65  Resp: 18 18  Temp: 36.8 C 36.7 C  SpO2: 98% 95%    Last Pain:  Vitals:   05/05/19 0421  TempSrc:   PainSc: 2                  Cody Albus Lorenza Chick

## 2019-05-05 NOTE — TOC Transition Note (Signed)
Transition of Care [facility]) - CM/SW Discharge Note   Patient Details  Name: Steven Saunders. MRN: [id number] Date of Birth: 19-Jan-1951  [facility] CM/SW Contact:  Su Hilt, RN Phone Number: 05/05/2019, 11:12 AM   Clinical Narrative:     Met with the patient to discuss needs and DC plan He stated that he lives at home with his wife and she provides transportation, he has DME at home including a walker and does not need more. Already has Outpatient PT appointment next week and has no other needs        Patient Goals and CMS Choice        Discharge Placement                       Discharge Plan and Services                                     Social Determinants of Health (SDOH) Interventions     Readmission Risk Interventions No flowsheet data found.

## 2019-05-05 NOTE — Discharge Instructions (Signed)

## 2019-05-05 NOTE — Discharge Summary (Signed)
Physician Discharge Summary  Patient ID: Steven Saunders. MRN: [id number] DOB/AGE: 70-05-1951 70 y.o.  Admit date: 05/04/2019 Discharge date: 05/05/2019  Admission Diagnoses:  M16.11 unilateral primary osteoarthritis right hip <principal problem not specified>  Discharge Diagnoses:  M16.11 unilateral primary osteoarthritis right hip Active Problems:   Status post total hip replacement, right   Past Medical History:  Diagnosis Date  . Cancer [facility])    prostate  . Hypertension   . Permanent atrial fibrillation     Surgeries: Procedure(s): TOTAL HIP ARTHROPLASTY ANTERIOR APPROACH on 05/04/2019   Consultants (if any):   Discharged Condition: Improved  Hospital Course: Rufas Scronce. is an 68 y.o. male who was admitted 05/04/2019 with a diagnosis of  M16.11 unilateral primary osteoarthritis right hip <principal problem not specified> and went to the operating room on 05/04/2019 and underwent the above named procedures.    He was given perioperative antibiotics:  Anti-infectives (From admission, onward)   Start     Dose/Rate Route Frequency Ordered Stop   05/04/19 1600  ceFAZolin (ANCEF) IVPB 2g/100 mL premix     2 g 200 mL/hr over 30 Minutes Intravenous Every 6 hours 05/04/19 1550 05/05/19 0334   05/04/19 0921  50,000 units bacitracin in 0.9% normal saline 250 mL irrigation  Status:  Discontinued       As needed 05/04/19 0922 05/04/19 1047   05/04/19 0711  ceFAZolin (ANCEF) 2-4 GM/100ML-% IVPB    Note to Pharmacy: Garfield Cornea   : cabinet override      05/04/19 0711 05/04/19 0842   05/04/19 0600  ceFAZolin (ANCEF) IVPB 2g/100 mL premix     2 g 200 mL/hr over 30 Minutes Intravenous On call to O.R. 05/03/19 2237 05/04/19 [id number]    .  He was given sequential compression devices, early ambulation, and aspirin for DVT prophylaxis.  He benefited maximally from the hospital stay and there were no complications.    Recent vital signs:  Vitals:   05/05/19 0856 05/05/19 0959   BP: (!) 171/77   Pulse: 75 75  Resp: 18   Temp: 98.2 F (36.8 C)   SpO2: 99%     Recent laboratory studies:  Lab Results  Component Value Date   HGB 12.5 (L) 05/05/2019   HGB 14.9 04/27/2019   HGB 12.4 (L) 11/15/2016   Lab Results  Component Value Date   WBC 9.7 05/05/2019   PLT 135 (L) 05/05/2019   Lab Results  Component Value Date   INR 1.0 04/27/2019   Lab Results  Component Value Date   NA 131 (L) 05/05/2019   K 3.2 (L) 05/05/2019   CL 95 (L) 05/05/2019   CO2 26 05/05/2019   BUN 12 05/05/2019   CREATININE 0.67 05/05/2019   GLUCOSE 138 (H) 05/05/2019    Discharge Medications:   Allergies as of 05/05/2019   No Known Allergies     Medication List    STOP taking these medications   aspirin EC 81 MG tablet Replaced by: aspirin 81 MG chewable tablet   meloxicam 15 MG tablet Commonly known as: MOBIC     TAKE these medications   acetaminophen 500 MG tablet Commonly known as: TYLENOL Take 500 mg by mouth 2 (two) times daily.   aspirin 81 MG chewable tablet Chew 1 tablet (81 mg total) by mouth 2 (two) times daily. Replaces: aspirin EC 81 MG tablet   carvedilol 25 MG tablet Commonly known as: COREG Take 1 tablet (25 mg total) by mouth  2 (two) times daily.   digoxin 0.125 MG tablet Commonly known as: LANOXIN TAKE 1 TABLET BY MOUTH ONCE DAILY   docusate sodium 100 MG capsule Commonly known as: COLACE Take 100 mg by mouth daily. What changed: Another medication with the same name was added. Make sure you understand how and when to take each.   docusate sodium 100 MG capsule Commonly known as: COLACE Take 1 capsule (100 mg total) by mouth 2 (two) times daily. What changed: You were already taking a medication with the same name, and this prescription was added. Make sure you understand how and when to take each.   glucosamine-chondroitin 500-400 MG tablet Take 1 tablet by mouth daily.   hydrochlorothiazide 25 MG tablet Commonly known as:  HYDRODIURIL TAKE 1 TABLET BY MOUTH ONCE DAILY   HYDROcodone-acetaminophen 5-325 MG tablet Commonly known as: NORCO/VICODIN Take 1 tablet by mouth every 4 (four) hours as needed for moderate pain (pain score 4-6).   irbesartan-hydrochlorothiazide 300-12.5 MG tablet Commonly known as: AVALIDE TAKE 1 TABLET BY MOUTH ONCE DAILY   loratadine 10 MG tablet Commonly known as: CLARITIN Take 10 mg by mouth daily.   lovastatin 20 MG tablet Commonly known as: MEVACOR TAKE 1 TABLET BY MOUTH ONCE EVERY EVENING AS DIRECTED What changed: See the new instructions.   Lutein 40 MG Caps Take 40 mg by mouth daily.   multivitamin tablet Take 1 tablet by mouth daily.   sildenafil 20 MG tablet Commonly known as: REVATIO Take 2-5 tablets (40-100 mg total) by mouth daily as needed.   tadalafil 20 MG tablet Commonly known as: CIALIS Take 1 tablet (20 mg total) by mouth daily as needed for erectile dysfunction.   tamsulosin 0.4 MG Caps capsule Commonly known as: FLOMAX Take 1 capsule (0.4 mg total) by mouth daily.   valACYclovir 1000 MG tablet Commonly known as: VALTREX TAKE 2 TABLETS BY MOUTH EVERY 12 HOURS AS NEEDED What changed: See the new instructions.       Diagnostic Studies: Dg Pelvis Portable  Result Date: 05/04/2019 CLINICAL DATA:  68 year old male with right hip arthroplasty. EXAM: PORTABLE PELVIS 1-2 VIEWS COMPARISON:  Earlier intraoperative fluoroscopy images dated 05/04/2019 FINDINGS: Total bilateral hip arthroplasties noted. The arthroplasty components appear intact and in anatomic alignment. No evidence of complication or loosening. No acute fracture or dislocation. Soft tissue edema and small pockets of soft tissue air in the right hip, with related to recent surgery. Cutaneous surgical clips noted. IMPRESSION: Total bilateral hip arthroplasties without complication. Electronically Signed   By: Anner Crete M.D.   On: 05/04/2019 11:18   Dg Hip Operative Unilat W Or W/o  Pelvis Right  Result Date: 05/04/2019 CLINICAL DATA:  68 year old male with total right hip arthroplasty. EXAM: OPERATIVE right HIP (WITH PELVIS IF PERFORMED) 1 VIEW TECHNIQUE: Fluoroscopic spot image(s) were submitted for interpretation post-operatively. COMPARISON:  Pelvic radiograph dated 06/07/2013 FINDINGS: 2 intraoperative fluoroscopy images in AP view provided. Total fluoroscopic time is 19 seconds. There is a total right hip arthroplasty. The arthroplasty appears intact without evidence of complication on the provided images. IMPRESSION: Right total hip arthroplasty. Electronically Signed   By: Anner Crete M.D.   On: 05/04/2019 10:50    Disposition: Discharge disposition: 01-Home or Self Care            Signed: [name] ,MD 05/05/2019, 10:13 AM

## 2019-05-05 NOTE — Progress Notes (Signed)
Physical Therapy Treatment Patient Details Name: Steven Saunders. MRN: [id number] DOB: [birth year] Today's Date: 05/05/2019    History of Present Illness 68 yo male s/p right THR direct anterior approach on 05/04/19. PMH includes HTN, permanent Afib, prostate cancer, left total hip replacement (2014)    PT Comments    Pt did well with PT session showing independence with mobility in bed, getting to standing, during ambulation and with negotiation of steps.  He needed cuing t/o most of this session to insure idealized gait and facilitate appropriate execution. He did not c/o pain t/o the entire session but was sore deep in the hip, also showed somewhat forward flexed at the hip posture, but was able to straighten up when focused on it.  Pt eager to get home, highly motivated t/o session.   Follow Up Recommendations  Follow surgeon's recommendation for DC plan and follow-up therapies     Equipment Recommendations  None recommended by PT    Recommendations for Other Services       Precautions / Restrictions Precautions Precautions: Anterior Hip Restrictions Weight Bearing Restrictions: Yes RLE Weight Bearing: Weight bearing as tolerated LLE Weight Bearing: Weight bearing as tolerated    Mobility  Bed Mobility Overal bed mobility: Modified Independent Bed Mobility: Supine to Sit;Sit to Supine     Supine to sit: Supervision Sit to supine: Supervision   General bed mobility comments: Pt using hand rails to get up to L side of bed (the side he uses at home) w/o phyiscal assist, also able to get himself back into bed w/o assist, though he needed heavy UE use to lift R LE back into the bed  Transfers Overall transfer level: Modified independent Equipment used: Rolling walker (2 wheeled) Transfers: Sit to/from Stand Sit to Stand: Modified independent (Device/Increase time)         General transfer comment: Pt able to rise from standing from standard height bed w/o assist or  need for excessive cuing, etc  Ambulation/Gait Ambulation/Gait assistance: Supervision Gait Distance (Feet): 250 Feet Assistive device: Rolling walker (2 wheeled)       General Gait Details: Pt with slightly forward flexed hip posture that with cuing he is able to correct temporarily.  He was able to ambulate with consistent cadence and decent step length though he still did have some minimal hesitancy t/o the effort.  Overall pt did well with no LOBs or overt safety issues.    Stairs Stairs: Yes Stairs assistance: Modified independent (Device/Increase time) Stair Management: Two rails;Step to pattern;Forwards Number of Stairs: 4 General stair comments: Pt able to easily negotiate up/down steps with only cursory cuing for appropriate sequencing, pt showed good confidence and safety on the steps   Wheelchair Mobility    Modified Rankin (Stroke Patients Only)       Balance Overall balance assessment: Modified Independent                                          Cognition Arousal/Alertness: Awake/alert Behavior During Therapy: WFL for tasks assessed/performed Overall Cognitive Status: Within Functional Limits for tasks assessed                                        Exercises Total Joint Exercises Ankle Circles/Pumps: AROM;10 reps Short Arc Quad: Strengthening;15  reps Heel Slides: Strengthening;10 reps(with resisted leg extensions) Hip ABduction/ADduction: AROM;Strengthening;10 reps(pt with increased AROM/resistance with increased reps) Straight Leg Raises: AAROM;10 reps    General Comments        Pertinent Vitals/Pain Pain Assessment: (reprorts deep soreness more than any true pain)    Home Living                      Prior Function            PT Goals (current goals can now be found in the care plan section) Progress towards PT goals: Progressing toward goals    Frequency    BID      PT Plan Current plan  remains appropriate    Co-evaluation              AM-PAC PT "6 Clicks" Mobility   Outcome Measure  Help needed turning from your back to your side while in a flat bed without using bedrails?: None Help needed moving from lying on your back to sitting on the side of a flat bed without using bedrails?: None Help needed moving to and from a bed to a chair (including a wheelchair)?: None Help needed standing up from a chair using your arms (e.g., wheelchair or bedside chair)?: None Help needed to walk in hospital room?: None Help needed climbing 3-5 steps with a railing? : None 6 Click Score: 24    End of Session Equipment Utilized During Treatment: Gait belt Activity Tolerance: Patient tolerated treatment well Patient left: in bed;with call bell/phone within reach;with nursing/sitter in room Nurse Communication: Mobility status PT Visit Diagnosis: Other abnormalities of gait and mobility (R26.89);Difficulty in walking, not elsewhere classified (R26.2)     Time: [id number] PT Time Calculation (min) (ACUTE ONLY): 29 min  Charges:  $Gait Training: 8-22 mins $Therapeutic Exercise: 8-22 mins                     Kreg Shropshire, DPT 05/05/2019, 10:21 AM

## 2019-05-06 ENCOUNTER — Encounter: Payer: Self-pay | Admitting: Family Medicine

## 2019-05-19 ENCOUNTER — Other Ambulatory Visit: Payer: Self-pay | Admitting: Family Medicine

## 2019-06-14 ENCOUNTER — Other Ambulatory Visit: Payer: Self-pay | Admitting: Family Medicine

## 2019-06-14 DIAGNOSIS — Z96641 Presence of right artificial hip joint: Secondary | ICD-10-CM | POA: Diagnosis not present

## 2019-06-15 NOTE — Telephone Encounter (Signed)
same

## 2019-06-27 ENCOUNTER — Other Ambulatory Visit: Payer: Self-pay | Admitting: Urology

## 2019-06-27 DIAGNOSIS — N401 Enlarged prostate with lower urinary tract symptoms: Secondary | ICD-10-CM

## 2019-06-27 DIAGNOSIS — R351 Nocturia: Secondary | ICD-10-CM

## 2019-07-05 DIAGNOSIS — M5416 Radiculopathy, lumbar region: Secondary | ICD-10-CM | POA: Diagnosis not present

## 2019-07-11 ENCOUNTER — Other Ambulatory Visit: Payer: Self-pay | Admitting: Family Medicine

## 2019-07-11 DIAGNOSIS — I10 Essential (primary) hypertension: Secondary | ICD-10-CM

## 2019-07-25 ENCOUNTER — Other Ambulatory Visit: Payer: Self-pay

## 2019-07-27 ENCOUNTER — Telehealth: Payer: Self-pay | Admitting: Family Medicine

## 2019-07-27 NOTE — Telephone Encounter (Signed)
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

## 2019-07-27 NOTE — Telephone Encounter (Signed)
Patient advised.

## 2019-07-27 NOTE — Telephone Encounter (Signed)
Form is in regards to his cardiac condition. It has to be competed by his cardiologist. Dr. Lovena Le.

## 2019-07-27 NOTE — Telephone Encounter (Signed)
Form placed on Dr. Fishers desk for review.  

## 2019-08-02 DIAGNOSIS — Z01818 Encounter for other preprocedural examination: Secondary | ICD-10-CM | POA: Diagnosis not present

## 2019-08-02 DIAGNOSIS — M5416 Radiculopathy, lumbar region: Secondary | ICD-10-CM | POA: Diagnosis not present

## 2019-08-03 ENCOUNTER — Telehealth: Payer: Self-pay | Admitting: *Deleted

## 2019-08-03 NOTE — Telephone Encounter (Signed)
   [facility] Pre-operative Risk Assessment    Request for surgical clearance:  1. What type of surgery is being performed? 3 LEVEL LUMBAR LAMINECTOMY   2. When is this surgery scheduled? 08/08/19   3. What type of clearance is required (medical clearance vs. Pharmacy clearance to hold med vs. Both)? MEDICAL  4. Are there any medications that need to be held prior to surgery and how long? ASA  5. Practice name and name of physician performing surgery? [facility]; DR. Marcello Moores DIMMIG   6. What is your office phone number [phone]    7.   What is your office fax number [phone]  8.   Anesthesia type (None, local, MAC, general) ? GENERAL   Steven Saunders 08/03/2019, 8:45 AM  _________________________________________________________________   (provider comments below)

## 2019-08-03 NOTE — Telephone Encounter (Signed)
      Primary Cardiologist: Cristopher Peru, MD  Chart reviewed as part of pre-operative protocol coverage. Given past medical history and time since last visit, based on ACC/AHA guidelines, Steven Saunders. would be at acceptable risk for the planned procedure without further cardiovascular testing. Spoke with patient concerning upcoming lumbar laminectomy scheduled with [facility] on 08/08/2019. He is without complaint, no chest pain or dyspnea. Continues on ASA for Atrial fib. Marland Kitchen He will hold his ASA for two days prior to surgery. He has a follow up with Dr. Lovena Le on 08/18/2019.    I will route this recommendation to the requesting party via Epic fax function and remove from pre-op pool.  Please call with questions.  Phill Myron. Kiel Cockerell DNP, ANP, AACC  08/03/2019, 9:09 AM

## 2019-08-04 ENCOUNTER — Other Ambulatory Visit: Payer: PPO

## 2019-08-05 DIAGNOSIS — Z01812 Encounter for preprocedural laboratory examination: Secondary | ICD-10-CM | POA: Diagnosis not present

## 2019-08-05 DIAGNOSIS — Z20828 Contact with and (suspected) exposure to other viral communicable diseases: Secondary | ICD-10-CM | POA: Diagnosis not present

## 2019-08-08 DIAGNOSIS — E7800 Pure hypercholesterolemia, unspecified: Secondary | ICD-10-CM | POA: Diagnosis not present

## 2019-08-08 DIAGNOSIS — Z96641 Presence of right artificial hip joint: Secondary | ICD-10-CM | POA: Diagnosis not present

## 2019-08-08 DIAGNOSIS — Z9889 Other specified postprocedural states: Secondary | ICD-10-CM | POA: Diagnosis not present

## 2019-08-08 DIAGNOSIS — I10 Essential (primary) hypertension: Secondary | ICD-10-CM | POA: Diagnosis not present

## 2019-08-08 DIAGNOSIS — E785 Hyperlipidemia, unspecified: Secondary | ICD-10-CM | POA: Diagnosis not present

## 2019-08-08 DIAGNOSIS — Z87891 Personal history of nicotine dependence: Secondary | ICD-10-CM | POA: Diagnosis not present

## 2019-08-08 DIAGNOSIS — Z7982 Long term (current) use of aspirin: Secondary | ICD-10-CM | POA: Diagnosis not present

## 2019-08-08 DIAGNOSIS — N400 Enlarged prostate without lower urinary tract symptoms: Secondary | ICD-10-CM | POA: Diagnosis not present

## 2019-08-08 DIAGNOSIS — M48061 Spinal stenosis, lumbar region without neurogenic claudication: Secondary | ICD-10-CM | POA: Diagnosis not present

## 2019-08-08 DIAGNOSIS — M5416 Radiculopathy, lumbar region: Secondary | ICD-10-CM | POA: Diagnosis not present

## 2019-08-08 DIAGNOSIS — I4891 Unspecified atrial fibrillation: Secondary | ICD-10-CM | POA: Diagnosis not present

## 2019-08-11 ENCOUNTER — Ambulatory Visit: Payer: PPO | Admitting: Urology

## 2019-08-17 ENCOUNTER — Ambulatory Visit: Payer: PPO | Admitting: Internal Medicine

## 2019-08-17 ENCOUNTER — Other Ambulatory Visit: Payer: Self-pay

## 2019-08-17 ENCOUNTER — Encounter: Payer: Self-pay | Admitting: Internal Medicine

## 2019-08-17 ENCOUNTER — Telehealth: Payer: Self-pay | Admitting: *Deleted

## 2019-08-17 VITALS — BP 142/72 | HR 73 | Ht 72.0 in | Wt 236.0 lb

## 2019-08-17 DIAGNOSIS — I4821 Permanent atrial fibrillation: Secondary | ICD-10-CM

## 2019-08-17 NOTE — Telephone Encounter (Signed)
Patient had Covid 19 testing in the past 14 days, negative results, prior to procedure.

## 2019-08-17 NOTE — Patient Instructions (Addendum)
Medication Instructions:  Your physician recommends that you continue on your current medications as directed. Please refer to the Current Medication list given to you today.  *If you need a refill on your cardiac medications before your next appointment, please call your pharmacy*  Lab Work: None ordered.  If you have labs (blood work) drawn today and your tests are completely normal, you will receive your results only by: Marland Kitchen MyChart Message (if you have MyChart) OR . A paper copy in the mail If you have any lab test that is abnormal or we need to change your treatment, we will call you to review the results.  Testing/Procedures: None ordered.   Follow-Up: At [facility], you and your health needs are our priority.  As part of our continuing mission to provide you with exceptional heart care, we have created designated Provider Care Teams.  These Care Teams include your primary Cardiologist (physician) and Advanced Practice Providers (APPs -  Physician Assistants and Nurse Practitioners) who all work together to provide you with the care you need, when you need it.  Your next appointment:  Your physician wants you to follow-up in: 1 year (early December) with Dr Lovena Le. You will receive a reminder letter in the mail two months in advance. If you don't receive a letter, please call our office to schedule the follow-up appointment.

## 2019-08-17 NOTE — Progress Notes (Signed)
HPI [name] returns today for followup. He is a very pleasant 68 year old man with a history of hypertension, and atrial fibrillation. He continues to do well. He has retired from working full time and was working at the [employer] before [employer]. He denies chest pain, sob, or sincope. No edema. He admits to some dietary indiscretion and has not been able to lose weight. He refuses to take systemic anti-coagulation. He has undergone multiple procedures this year.   No Known Allergies   Current Outpatient Medications  Medication Sig Dispense Refill  . acetaminophen (TYLENOL) 500 MG tablet Take 500 mg by mouth 2 (two) times daily.    Marland Kitchen aspirin 81 MG chewable tablet Chew 1 tablet (81 mg total) by mouth 2 (two) times daily. 60 tablet 0  . carvedilol (COREG) 25 MG tablet TAKE 1 TABLET BY MOUTH TWICE DAILY 180 tablet 4  . digoxin (LANOXIN) 0.125 MG tablet TAKE 1 TABLET BY MOUTH ONCE DAILY (Patient taking differently: Take 0.125 mg by mouth daily. ) 90 tablet 4  . docusate sodium (COLACE) 100 MG capsule Take 100 mg by mouth daily.    Marland Kitchen glucosamine-chondroitin 500-400 MG tablet Take 1 tablet by mouth daily.    . hydrochlorothiazide (HYDRODIURIL) 25 MG tablet TAKE 1 TABLET BY MOUTH ONCE DAILY 90 tablet 3  . HYDROcodone-acetaminophen (NORCO/VICODIN) 5-325 MG tablet Take 1 tablet by mouth every 4 (four) hours as needed for moderate pain (pain score 4-6). 30 tablet 0  . irbesartan-hydrochlorothiazide (AVALIDE) 300-12.5 MG tablet TAKE 1 TABLET BY MOUTH ONCE DAILY (Patient taking differently: Take 1 tablet by mouth daily. ) 30 tablet 11  . loratadine (CLARITIN) 10 MG tablet Take 10 mg by mouth daily.      Marland Kitchen lovastatin (MEVACOR) 20 MG tablet TAKE 1 TABLET BY MOUTH ONCE EVERY EVENING 30 tablet 12  . Lutein 40 MG CAPS Take 40 mg by mouth daily.    . Multiple Vitamin (MULTIVITAMIN) tablet Take 1 tablet by mouth daily.      . tamsulosin (FLOMAX) 0.4 MG CAPS capsule Take 1 capsule (0.4 mg total) by mouth  daily. 30 capsule 3  . valACYclovir (VALTREX) 1000 MG tablet Take 1,000 mg by mouth every 12 (twelve) hours as needed (fever blister).     No current facility-administered medications for this visit.     Past Medical History:  Diagnosis Date  . Cancer [facility])    prostate  . Hypertension   . Permanent atrial fibrillation (HCC)     ROS:   All systems reviewed and negative except as noted in the HPI.   Past Surgical History:  Procedure Laterality Date  . APPENDECTOMY    . CARDIAC CATHETERIZATION  2007  . COLONOSCOPY WITH PROPOFOL N/A 05/26/2017   Procedure: COLONOSCOPY WITH PROPOFOL;  Surgeon: [name], MD;  Location: [facility] ENDOSCOPY;  Service: Endoscopy;  Laterality: N/A;  . HERNIA REPAIR     1950's x 2  . JOINT REPLACEMENT Left 2014  . PROSTATE BIOPSY    . thumb surgery  1980   had staph infection and had to cut out the area  . TOTAL HIP ARTHROPLASTY Left 2014   Dr. Sabra Heck  . TOTAL HIP ARTHROPLASTY Right 05/04/2019   Procedure: TOTAL HIP ARTHROPLASTY ANTERIOR APPROACH;  Surgeon: [name], MD;  Location: [facility] ORS;  Service: Orthopedics;  Laterality: Right;     Family History  Problem Relation Age of Onset  . Hypertension Mother   . Heart attack Father   .  Melanoma Father   . Prostate cancer Neg Hx   . Bladder Cancer Neg Hx   . Kidney cancer Neg Hx      Social History   Socioeconomic History  . Marital status: Married    Spouse name: Not on file  . Number of children: 2  . Years of education: Not on file  . Highest education level: Some college, no degree  Occupational History  . Occupation: Retired    Comment: Still drives school bus  Tobacco Use  . Smoking status: Former Smoker    Packs/day: 2.00    Years: 15.00    Pack years: 30.00    Types: Cigarettes    Quit date: 11/12/2009    Years since quitting: 9.7  . Smokeless tobacco: Never Used  Substance and Sexual Activity  . Alcohol use: Yes    Alcohol/week: 8.0 - 10.0 standard drinks    Types:  8 - 10 Cans of beer per week  . Drug use: No  . Sexual activity: Yes    Birth control/protection: None  Other Topics Concern  . Not on file  Social History Narrative  . Not on file   Social Determinants of Health   Financial Resource Strain:   . Difficulty of Paying Living Expenses: Not on file  Food Insecurity:   . Worried About Charity fundraiser in the Last Year: Not on file  . Ran Out of Food in the Last Year: Not on file  Transportation Needs:   . Lack of Transportation (Medical): Not on file  . Lack of Transportation (Non-Medical): Not on file  Physical Activity:   . Days of Exercise per Week: Not on file  . Minutes of Exercise per Session: Not on file  Stress:   . Feeling of Stress : Not on file  Social Connections:   . Frequency of Communication with Friends and Family: Not on file  . Frequency of Social Gatherings with Friends and Family: Not on file  . Attends Religious Services: Not on file  . Active Member of Clubs or Organizations: Not on file  . Attends Archivist Meetings: Not on file  . Marital Status: Not on file  Intimate Partner Violence:   . Fear of Current or Ex-Partner: Not on file  . Emotionally Abused: Not on file  . Physically Abused: Not on file  . Sexually Abused: Not on file     BP (!) 142/72   Pulse 73   Ht 6' (1.829 m)   Wt 236 lb (107 kg)   SpO2 100%   BMI 32.01 kg/m   Physical Exam:  Well appearing NAD HEENT: Unremarkable Neck:  No JVD, no thyromegally Lymphatics:  No adenopathy Back:  No CVA tenderness Lungs:  Clear with no wheezes HEART:  IRegular rate rhythm, no murmurs, no rubs, no clicks Abd:  soft, positive bowel sounds, no organomegally, no rebound, no guarding Ext:  2 plus pulses, no edema, no cyanosis, no clubbing Skin:  No rashes no nodules Neuro:  CN II through XII intact, motor grossly intact  EKG - atrial fib with a controlled VR with RBBB  Assess/Plan: 1. Atrial fib - his VR is well controlled on  a combination of beta blockers and digoxin.  2. Coags - he still refuses to take systemic anti-coagulation.  3. HTN -his bp is up a bit but he notes that it is better when not in the MD's office. 4. Covid - he is currently not interested in  pursing a vaccine.  Mikle Bosworth.D.

## 2019-08-29 ENCOUNTER — Other Ambulatory Visit: Payer: Self-pay | Admitting: Family Medicine

## 2019-08-29 DIAGNOSIS — I10 Essential (primary) hypertension: Secondary | ICD-10-CM

## 2019-09-07 ENCOUNTER — Other Ambulatory Visit: Payer: Self-pay

## 2019-09-07 DIAGNOSIS — C61 Malignant neoplasm of prostate: Secondary | ICD-10-CM

## 2019-09-08 ENCOUNTER — Other Ambulatory Visit: Payer: PPO

## 2019-09-08 ENCOUNTER — Other Ambulatory Visit: Payer: Self-pay

## 2019-09-08 DIAGNOSIS — C61 Malignant neoplasm of prostate: Secondary | ICD-10-CM | POA: Diagnosis not present

## 2019-09-09 LAB — PSA: Prostate Specific Ag, Serum: 8.9 ng/mL — ABNORMAL HIGH (ref 0.0–4.0)

## 2019-09-15 ENCOUNTER — Encounter: Payer: Self-pay | Admitting: Urology

## 2019-09-15 ENCOUNTER — Other Ambulatory Visit: Payer: Self-pay

## 2019-09-15 ENCOUNTER — Ambulatory Visit: Payer: PPO | Admitting: Urology

## 2019-09-15 VITALS — BP 168/89 | HR 82 | Ht 72.0 in | Wt 243.0 lb

## 2019-09-15 DIAGNOSIS — C61 Malignant neoplasm of prostate: Secondary | ICD-10-CM | POA: Diagnosis not present

## 2019-09-15 NOTE — Patient Instructions (Signed)
Prostate Cancer  The prostate is a walnut-sized gland that is involved in the production of semen. It is located below a man's bladder, in front of the rectum. Prostate cancer is the abnormal growth of cells in the prostate gland. What are the causes? The exact cause of this condition is not known. What increases the risk? This condition is more likely to develop in men who:  Are older than age 71.  Are African-American.  Are obese.  Have a family history of prostate cancer.  Have a family history of breast cancer. What are the signs or symptoms? Symptoms of this condition include:  A need to urinate often.  Weak or interrupted flow of urine.  Trouble starting or stopping urination.  Inability to urinate.  Pain or burning during urination.  Painful ejaculation.  Blood in urine or semen.  Persistent pain or discomfort in the lower back, lower abdomen, hips, or upper thighs.  Trouble getting an erection.  Trouble emptying the bladder all the way. How is this diagnosed? This condition can be diagnosed with:  A digital rectal exam. For this exam, a health care provider inserts a gloved finger into the rectum to feel the prostate gland.  A blood test called a prostate-specific antigen (PSA) test.  An imaging test called transrectal ultrasonography.  A procedure in which a sample of tissue is taken from the prostate and examined under a microscope (prostate biopsy). Once the condition is diagnosed, tests will be done to determine how far the cancer has spread. This is called staging the cancer. Staging may involve imaging tests, such as:  A bone scan.  A CT scan.  A PET scan.  An MRI. The stages of prostate cancer are as follows:  Stage I. At this stage, the cancer is found in the prostate only. The cancer is not visible on imaging tests and it is usually found by accident, such as during a prostate surgery.  Stage II. At this stage, the cancer is more advanced  than it is in stage I, but the cancer has not spread outside the prostate.  Stage III. At this stage, the cancer has spread beyond the outer layer of the prostate to nearby tissues. The cancer may be found in the seminal vesicles, which are near the bladder and the prostate.  Stage IV. At this stage, the cancer has spread other parts of the body, such as the lymph nodes, bones, bladder, rectum, liver, or lungs. How is this treated? Treatment for this condition depends on several factors, including the stage of the cancer, your age, personal preferences, and your overall health. Talk with your health care provider about treatment options that are recommended for you. Common treatments include:  Observation for early stage prostate cancer (active surveillance). This involves having exams, blood tests, and in some cases, more biopsies. For some men, this is the only treatment needed.  Surgery. Types of surgeries include: ? Open surgery. In this surgery, a larger incision is made to remove the prostate. ? A laparoscopic prostatectomy. This is a surgery to remove the prostate and lymph nodes through several, small incisions. It is often referred to as a minimally invasive surgery. ? A robotic prostatectomy. This is a surgery to remove the prostate and lymph nodes with the help of a robotic arm that is controlled by a computer. ? Orchiectomy. This is a surgery to remove the testicles. ? Cryosurgery. This is a surgery to freeze and destroy cancer cells.  Radiation treatment. Types   of radiation treatment include: ? External beam radiation. This type aims beams of radiation from outside the body at the prostate to destroy cancerous cells. ? Brachytherapy. This type uses radioactive needles, seeds, wires, or tubes that are implanted into the prostate gland. Like external beam radiation, brachytherapy destroys cancerous cells. An advantage is that this type of radiation limits the damage to surrounding  tissue and has fewer side effects.  High-intensity, focused ultrasonography. This treatment destroys cancer cells by delivering high-energy ultrasound waves to the cancerous cells.  Chemotherapy medicines. This treatment kills cancer cells or stops them from multiplying.  Hormone treatment. This treatment involves taking medicines that act on one of the male hormones (testosterone): ? By stopping your body from producing testosterone. ? By blocking testosterone from reaching cancer cells. Follow these instructions at home:  Take over-the-counter and prescription medicines only as told by your health care provider.  Maintain a healthy diet.  Get plenty of sleep.  Consider joining a support group for men who have prostate cancer. Meeting with a support group may help you learn to cope with the stress of having cancer.  Keep all follow-up visits as told by your health care provider. This is important.  If you have to go to the hospital, notify your cancer specialist (oncologist).  Treatment for prostate cancer may affect sexual function. Continue to have intimate moments with your partner. This may include touching, holding, hugging, and caressing. Contact a health care provider if:  You have trouble urinating.  You have blood in your urine.  You have pain in your hips, back, or chest. Get help right away if:  You have weakness or numbness in your legs.  You cannot control urination or your bowel movements (incontinence).  You have trouble breathing.  You have sudden chest pain.  You have chills or a fever. Summary  The prostate is a walnut-sized gland that is involved in the production of semen. It is located below a man's bladder, in front of the rectum. Prostate cancer is the abnormal growth of cells in the prostate gland.  Treatment for this condition depends on several factors, including the stage of the cancer, your age, personal preferences, and your overall health.  Talk with your health care provider about treatment options that are recommended for you.  Consider joining a support group for men who have prostate cancer. Meeting with a support group may help you learn to cope with the stress of having cancer. This information is not intended to replace advice given to you by your health care provider. Make sure you discuss any questions you have with your health care provider. Document Revised: 07/31/2017 Document Reviewed: 04/28/2016 Elsevier Patient Education  2020 Elsevier Inc.  

## 2019-09-15 NOTE — Progress Notes (Signed)
   09/15/2019 9:34 AM   Steven Saunders. 02-Mar-1951 [id number]  Reason for visit: Follow up prostate cancer on active surveillance  HPI: I saw Mr. Zappa back in urology clinic today for active surveillance for favorable intermediate risk prostate cancer. He is a 69 year old male with a cardiac history, mild lower urinary tract symptoms, and erectile dysfunction on sildenafil.He originally underwent a prostate biopsy for an elevated PSA of 7.3 with Dr. Pilar Jarvis in November 2018. This showed low risk disease with Gleason score 3+3=6 in 8/12 cores, with max core involvement of 37%. He elected for active surveillance with confirmatory biopsy. He underwent a confirmatory biopsy with myself 07/20/2018,with PSA of 6.3. This showed primarily Gleason score 3+3 =6 in 9/12 cores, however there was a single core of Gleason score 3+4 = 7 with grade 4 comprising 10% of the tumor. Max core involvement overall was 40%.  Prostate volume was 51 g, and PSA density was 0.12.  We had discussed the AUA guidelines regarding low risk, intermediate risk, and high risk disease and treatment strategies including active surveillance versus intervention with surgery or radiation at our last visit.  The patient was adamant about remaining on active surveillance.  We discussed the risks of this at length.  PSA today is relatively stable at 8.9 from 8.1, and 7.3 at time of initial diagnosis.  We again reviewed treatment strategies for favorable intermediate risk disease, and that active surveillance is not typically the first-line management.  He is again adamant that he would like to remain on active surveillance.  We discussed the risks of active surveillance including the low, but not 0, risk of developing metastatic prostate cancer.  He understands these risks and would like to continue active surveillance.  In summary, the patient is a 69 year old male who has chosen active surveillance for favorable intermediate  risk prostate cancer.  PSA is relatively stable at 8.9 from 7.3 at time of diagnosis 2 years ago.  We discussed other strategies including prostate MRI (he has a hip replacement that is MRI compatible reportedly), repeat biopsy, or definitive treatment with radiation or surgery.  He understands the risks of active surveillance with favorable intermediate risk prostate cancer.  He is amenable to considering radiation if the PSA continues to rise above 10 which would put him in the unfavorable intermediate risk category.  RTC 6 months for PSA for active surveillance, consider referral to radiation oncology if PSA greater than 10  A total of 30 minutes were spent face-to-face with the patient, greater than 50% was spent in patient education, counseling, and coordination of care regarding prostate cancer and treatment options.  Billey Co, [facility] [address] [place], [place] [address] [phone]

## 2019-10-21 ENCOUNTER — Other Ambulatory Visit: Payer: Self-pay | Admitting: Urology

## 2019-10-21 DIAGNOSIS — N401 Enlarged prostate with lower urinary tract symptoms: Secondary | ICD-10-CM

## 2020-01-24 ENCOUNTER — Other Ambulatory Visit: Payer: Self-pay | Admitting: Family Medicine

## 2020-01-24 DIAGNOSIS — I10 Essential (primary) hypertension: Secondary | ICD-10-CM

## 2020-01-24 NOTE — Telephone Encounter (Signed)
Courtesy refill  

## 2020-01-27 ENCOUNTER — Encounter: Payer: Self-pay | Admitting: Urology

## 2020-01-28 ENCOUNTER — Other Ambulatory Visit: Payer: Self-pay | Admitting: Family Medicine

## 2020-01-28 DIAGNOSIS — I10 Essential (primary) hypertension: Secondary | ICD-10-CM

## 2020-01-28 NOTE — Telephone Encounter (Signed)
Requested Prescriptions  Pending Prescriptions Disp Refills  . irbesartan-hydrochlorothiazide (AVALIDE) 300-12.5 MG tablet [Pharmacy Med Name: IRBESARTAN-HCTZ 300-12.5 MG TAB] 30 tablet 0    Sig: TAKE 1 TABLET BY MOUTH ONCE DAILY     Cardiovascular: ARB + Diuretic Combos Failed - 01/28/2020  8:55 AM      Failed - K in normal range and within 180 days    Potassium  Date Value Ref Range Status  05/05/2019 3.2 (L) 3.5 - 5.1 mmol/L Final  06/08/2013 3.8 3.5 - 5.1 mmol/L Final         Failed - Na in normal range and within 180 days    Sodium  Date Value Ref Range Status  05/05/2019 131 (L) 135 - 145 mmol/L Final  06/04/2018 134 134 - 144 mmol/L Final  06/08/2013 131 (L) 136 - 145 mmol/L Final         Failed - Cr in normal range and within 180 days    Creatinine  Date Value Ref Range Status  06/08/2013 0.93 0.60 - 1.30 mg/dL Final   Creatinine, Ser  Date Value Ref Range Status  05/05/2019 0.67 0.61 - 1.24 mg/dL Final         Failed - Ca in normal range and within 180 days    Calcium  Date Value Ref Range Status  05/05/2019 8.6 (L) 8.9 - 10.3 mg/dL Final   Calcium, Total  Date Value Ref Range Status  06/08/2013 8.1 (L) 8.5 - 10.1 mg/dL Final         Failed - Last BP in normal range    BP Readings from Last 1 Encounters:  09/15/19 (!) 168/89         Failed - Valid encounter within last 6 months    Recent Outpatient Visits          9 months ago Encounter for preoperative examination for general surgical procedure   [facility] Birdie Sons, MD   1 year ago Essential hypertension   [facility] Birdie Sons, MD   1 year ago Essential hypertension   [facility] Birdie Sons, MD   1 year ago Essential hypertension   [facility] [place], [place]   1 year ago Annual physical exam   [facility] Birdie Sons, MD      Future Appointments            In 1 month Diamantina Providence, Herbert Seta, MD  [place] - Patient is not pregnant

## 2020-02-15 ENCOUNTER — Other Ambulatory Visit: Payer: Self-pay

## 2020-02-15 ENCOUNTER — Encounter: Payer: Self-pay | Admitting: Family Medicine

## 2020-02-15 ENCOUNTER — Ambulatory Visit (INDEPENDENT_AMBULATORY_CARE_PROVIDER_SITE_OTHER): Payer: PPO | Admitting: Family Medicine

## 2020-02-15 VITALS — BP 154/96 | HR 94 | Temp 97.3°F | Wt 258.0 lb

## 2020-02-15 DIAGNOSIS — R972 Elevated prostate specific antigen [PSA]: Secondary | ICD-10-CM | POA: Diagnosis not present

## 2020-02-15 DIAGNOSIS — R7309 Other abnormal glucose: Secondary | ICD-10-CM

## 2020-02-15 DIAGNOSIS — I4821 Permanent atrial fibrillation: Secondary | ICD-10-CM | POA: Diagnosis not present

## 2020-02-15 DIAGNOSIS — I10 Essential (primary) hypertension: Secondary | ICD-10-CM | POA: Diagnosis not present

## 2020-02-15 NOTE — Patient Instructions (Addendum)
.   Please review the attached list of medications and notify my office if there are any errors.   . Please bring all of your medications to every appointment so we can make sure that our medication list is the same as yours.   . It is recommended to engage in 150 minutes of moderate exercise every week. Work on getting your weight back down under 230 in the next 3 months.

## 2020-02-15 NOTE — Progress Notes (Signed)
Established patient visit   Patient: Steven Saunders.   DOB: [birth year]   69 y.o. Male  MRN: [id number] Visit Date: 02/15/2020  Today's healthcare provider: Lelon Huh, MD   Chief Complaint  Patient presents with  . Hypertension   Subjective    HPI Hypertension, follow-up  BP Readings from Last 3 Encounters:  02/15/20 (!) 154/96  09/15/19 (!) 168/89  08/17/19 (!) 142/72   Wt Readings from Last 3 Encounters:  02/15/20 258 lb (117 kg)  09/15/19 243 lb (110.2 kg)  08/17/19 236 lb (107 kg)     He was last seen for hypertension 9 months ago.  BP at that visit was 150/86. Management since that visit includes continuing same medication.  He reports good compliance with treatment. He is not having side effects.  He is following a Regular diet. He is not exercising. He does not smoke.  Use of agents associated with hypertension: NSAIDS.   Outside blood pressures are 138-149/ 78-100. He states [employer] with his insurance came out to his home yesterday and his blood pressure reading was 138/100.  Symptoms: No chest pain No chest pressure  No palpitations No syncope  No dyspnea No orthopnea  No paroxysmal nocturnal dyspnea No lower extremity edema   Pertinent labs: Lab Results  Component Value Date   CHOL 124 03/22/2018   HDL 48 03/22/2018   LDLCALC 53 03/22/2018   TRIG 113 03/22/2018   CHOLHDL 2.6 03/22/2018   Lab Results  Component Value Date   NA 131 (L) 05/05/2019   K 3.2 (L) 05/05/2019   CREATININE 0.67 05/05/2019   GFRNONAA >60 05/05/2019   GFRAA >60 05/05/2019   GLUCOSE 138 (H) 05/05/2019     The ASCVD Risk score (Goff DC Jr., et al., 2013) failed to calculate for the following reasons:   The valid total cholesterol range is 130 to 320 mg/dL   ---------------------------------------------------------------------------------------------------  Lipid/Cholesterol, Follow-up  Last lipid panel Other pertinent labs  Lab Results    Component Value Date   CHOL 124 03/22/2018   HDL 48 03/22/2018   LDLCALC 53 03/22/2018   TRIG 113 03/22/2018   CHOLHDL 2.6 03/22/2018   Lab Results  Component Value Date   ALT 24 03/22/2018   AST 20 03/22/2018   PLT 135 (L) 05/05/2019     He was last seen for this 1 year ago.  Management since that visit includes continuing same medication.  He reports good compliance with treatment. He is not having side effects.   Symptoms: No chest pain No chest pressure/discomfort  No dyspnea No lower extremity edema  No numbness or tingling of extremity No orthopnea  No palpitations No paroxysmal nocturnal dyspnea  No speech difficulty No syncope   Current diet: in general, an "unhealthy" diet Current exercise: none  The ASCVD Risk score ([place]., et al., 2013) failed to calculate for the following reasons:   The valid total cholesterol range is 130 to 320 mg/dL  ---------------------------------------------------------------------------------------------------   Medications: Outpatient Medications Prior to Visit  Medication Sig  . acetaminophen (TYLENOL) 500 MG tablet Take 500 mg by mouth 2 (two) times daily.  Marland Kitchen aspirin 81 MG chewable tablet Chew 1 tablet (81 mg total) by mouth 2 (two) times daily.  . carvedilol (COREG) 25 MG tablet TAKE 1 TABLET BY MOUTH TWICE DAILY  . Cholecalciferol (VITAMIN D-3) 25 MCG (1000 UT) CAPS Take 1 capsule by mouth daily.  . digoxin (LANOXIN) 0.125 MG  tablet TAKE 1 TABLET BY MOUTH ONCE DAILY (Patient taking differently: Take 0.125 mg by mouth daily. )  . docusate sodium (COLACE) 100 MG capsule Take 100 mg by mouth daily.  Marland Kitchen glucosamine-chondroitin 500-400 MG tablet Take 1 tablet by mouth daily.  . hydrochlorothiazide (HYDRODIURIL) 25 MG tablet TAKE 1 TABLET BY MOUTH ONCE DAILY  . irbesartan-hydrochlorothiazide (AVALIDE) 300-12.5 MG tablet TAKE 1 TABLET BY MOUTH ONCE DAILY  . loratadine (CLARITIN) 10 MG tablet Take 10 mg by mouth daily.    Marland Kitchen  lovastatin (MEVACOR) 20 MG tablet TAKE 1 TABLET BY MOUTH ONCE EVERY EVENING  . Lutein 40 MG CAPS Take 40 mg by mouth daily.  . Multiple Vitamin (MULTIVITAMIN) tablet Take 1 tablet by mouth daily.    . tamsulosin (FLOMAX) 0.4 MG CAPS capsule TAKE 1 CAPSULE BY MOUTH ONCE DAILY  . valACYclovir (VALTREX) 1000 MG tablet Take 1,000 mg by mouth every 12 (twelve) hours as needed (fever blister).   No facility-administered medications prior to visit.    Review of Systems  Constitutional: Negative for appetite change, chills and fever.  Respiratory: Negative for chest tightness, shortness of breath and wheezing.   Cardiovascular: Negative for chest pain and palpitations.  Gastrointestinal: Negative for abdominal pain, nausea and vomiting.     Objective    BP (!) 154/96 (BP Location: Right Arm, Cuff Size: Large)   Pulse 94   Temp (!) 97.3 F (36.3 C) (Temporal)   Wt 258 lb (117 kg)   SpO2 95% Comment: room air  BMI 34.99 kg/m   Physical Exam   General: Appearance:    Obese male in no acute distress  Eyes:    PERRL, conjunctiva/corneas clear, EOM's intact       Lungs:     Clear to auscultation bilaterally, respirations unlabored  Heart:    Normal heart rate. Irregularly irregular rhythm. No murmurs, rubs, or gallops.   MS:   All extremities are intact.   Neurologic:   Awake, alert, oriented x 3. No apparent focal neurological           defect.          Assessment & Plan     1. Essential hypertension Uncontrolled. Anticipated changing hctz to either spironolactone or chlorthalidone after reviewing labs.  - CBC - Comprehensive metabolic panel - Lipid panel - TSH  2. Permanent atrial fibrillation (HCC) Asymptomatic. Compliant with medication.  Continue aggressive risk factor modification.  Continue regular follow up Dr. Lovena Le.   3. Morbid obesity ([place]) Counseled to work on losing 30 pounds over the next 3 months.   4. Abnormal blood sugar  - Hemoglobin A1c  5. Elevated  PSA Followed by Dr. Glori Luis and is due for follow up - PSA   Future Appointments  Date Time Provider [employer]  03/14/2020 10:00 AM [facility] None  03/16/2020  8:20 AM Birdie Sons, MD [facility]  03/21/2020 10:00 AM Billey Co, MD [facility] None         The entirety of the information documented in the History of Present Illness, Review of Systems and Physical Exam were personally obtained by me. Portions of this information were initially documented by the CMA and reviewed by me for thoroughness and accuracy.      Lelon Huh, MD  [facility] [phone] (phone) [phone] (fax)  [place]

## 2020-02-16 ENCOUNTER — Other Ambulatory Visit: Payer: Self-pay | Admitting: Family Medicine

## 2020-02-16 ENCOUNTER — Telehealth: Payer: Self-pay | Admitting: Urology

## 2020-02-16 DIAGNOSIS — I10 Essential (primary) hypertension: Secondary | ICD-10-CM

## 2020-02-16 DIAGNOSIS — C61 Malignant neoplasm of prostate: Secondary | ICD-10-CM

## 2020-02-16 LAB — LIPID PANEL
Chol/HDL Ratio: 2.8 ratio (ref 0.0–5.0)
Cholesterol, Total: 141 mg/dL (ref 100–199)
HDL: 50 mg/dL (ref >39)
LDL Chol Calc (NIH): 69 mg/dL (ref 0–99)
Triglycerides: 126 mg/dL (ref 0–149)
VLDL Cholesterol Cal: 22 mg/dL (ref 5–40)

## 2020-02-16 LAB — COMPREHENSIVE METABOLIC PANEL
ALT: 26 IU/L (ref 0–44)
AST: 23 IU/L (ref 0–40)
Albumin/Globulin Ratio: 1.9 (ref 1.2–2.2)
Albumin: 4.5 g/dL (ref 3.7–4.7)
Alkaline Phosphatase: 82 IU/L (ref 48–121)
BUN/Creatinine Ratio: 14 (ref 10–24)
BUN: 11 mg/dL (ref 8–27)
Bilirubin Total: 1.1 mg/dL (ref 0.0–1.2)
CO2: 26 mmol/L (ref 20–29)
Calcium: 9.3 mg/dL (ref 8.6–10.2)
Chloride: 94 mmol/L — ABNORMAL LOW (ref 96–106)
Creatinine, Ser: 0.77 mg/dL (ref 0.76–1.27)
GFR calc Af Amer: 106 mL/min/{1.73_m2} (ref >59)
GFR calc non Af Amer: 91 mL/min/{1.73_m2} (ref >59)
Globulin, Total: 2.4 g/dL (ref 1.5–4.5)
Glucose: 138 mg/dL — ABNORMAL HIGH (ref 65–99)
Potassium: 4 mmol/L (ref 3.5–5.2)
Sodium: 132 mmol/L — ABNORMAL LOW (ref 134–144)
Total Protein: 6.9 g/dL (ref 6.0–8.5)

## 2020-02-16 LAB — CBC
Hematocrit: 42 % (ref 37.5–51.0)
Hemoglobin: 14.3 g/dL (ref 13.0–17.7)
MCH: 33.3 pg — ABNORMAL HIGH (ref 26.6–33.0)
MCHC: 34 g/dL (ref 31.5–35.7)
MCV: 98 fL — ABNORMAL HIGH (ref 79–97)
Platelets: 134 10*3/uL — ABNORMAL LOW (ref 150–450)
RBC: 4.3 x10E6/uL (ref 4.14–5.80)
RDW: 13 % (ref 11.6–15.4)
WBC: 6.4 10*3/uL (ref 3.4–10.8)

## 2020-02-16 LAB — HEMOGLOBIN A1C
Est. average glucose Bld gHb Est-mCnc: 117 mg/dL
Hgb A1c MFr Bld: 5.7 % — ABNORMAL HIGH (ref 4.8–5.6)

## 2020-02-16 LAB — PSA: Prostate Specific Ag, Serum: 8.7 ng/mL — ABNORMAL HIGH (ref 0.0–4.0)

## 2020-02-16 LAB — TSH: TSH: 2.01 u[IU]/mL (ref 0.450–4.500)

## 2020-02-16 MED ORDER — SPIRONOLACTONE 25 MG PO TABS: 25.0000 mg | ORAL_TABLET | Freq: Every day | ORAL | 1 refills | Status: DC

## 2020-02-16 NOTE — Telephone Encounter (Signed)
Pt called to cancel apt for July to check PSA levels, he states he went to see his PCP and he did the PSA labs there. His PSA was  8.7.He states he sees no point in coming in since he had the levels checked.

## 2020-02-16 NOTE — Telephone Encounter (Signed)
Just FYI, pt's appt was canceled for July.

## 2020-02-17 ENCOUNTER — Other Ambulatory Visit: Payer: Self-pay | Admitting: Family Medicine

## 2020-02-17 DIAGNOSIS — I10 Essential (primary) hypertension: Secondary | ICD-10-CM

## 2020-02-17 NOTE — Telephone Encounter (Signed)
Patient notified and scheduled for Jan of next year for a yearly apt and PSA prior

## 2020-02-17 NOTE — Telephone Encounter (Signed)
He has known prostate cancer we are following with active surveillance, he should see [place] at least yearly for PSA/DRE, please offer visit in 6-12 months with PSA prior, thanks  Nickolas Madrid, MD 02/17/2020

## 2020-03-01 ENCOUNTER — Other Ambulatory Visit: Payer: Self-pay | Admitting: Family Medicine

## 2020-03-01 NOTE — Telephone Encounter (Signed)
Requested medication (s) are due for refill today: yes  Requested medication (s) are on the active medication list: yes  Last refill:  08/07/19  Future visit scheduled:yes  Notes to clinic: last ordered by historic provider    Requested Prescriptions  Pending Prescriptions Disp Refills   valACYclovir (VALTREX) 1000 MG tablet [Pharmacy Med Name: VALACYCLOVIR HCL 1 GM TAB] 20 tablet     Sig: TAKE 2 TABLETS BY MOUTH EVERY 12 HOURS AS NEEDED AS DIRECTED      Antimicrobials:  Antiviral Agents - Anti-Herpetic Passed - 03/01/2020 10:06 AM      Passed - Valid encounter within last 12 months    Recent Outpatient Visits           2 weeks ago Essential hypertension   [facility] Birdie Sons, MD   10 months ago Encounter for preoperative examination for general surgical procedure   [facility] Birdie Sons, MD   1 year ago Essential hypertension   [facility] Birdie Sons, MD   1 year ago Essential hypertension   [facility] Birdie Sons, MD   1 year ago Essential hypertension   [facility] [place], [place]       Future Appointments             In 2 weeks Caryn Section, Kirstie Peri, MD [facility], [place]   In 6 months Diamantina Providence, Herbert Seta, [place]

## 2020-03-14 ENCOUNTER — Other Ambulatory Visit: Payer: Self-pay

## 2020-03-16 ENCOUNTER — Encounter: Payer: Self-pay | Admitting: Family Medicine

## 2020-03-16 ENCOUNTER — Ambulatory Visit (INDEPENDENT_AMBULATORY_CARE_PROVIDER_SITE_OTHER): Payer: PPO | Admitting: Family Medicine

## 2020-03-16 ENCOUNTER — Other Ambulatory Visit: Payer: Self-pay

## 2020-03-16 VITALS — BP 130/81 | HR 62 | Temp 96.8°F | Resp 18 | Wt 254.0 lb

## 2020-03-16 DIAGNOSIS — I10 Essential (primary) hypertension: Secondary | ICD-10-CM

## 2020-03-16 MED ORDER — SPIRONOLACTONE 25 MG PO TABS: 25.0000 mg | ORAL_TABLET | Freq: Every day | ORAL | 1 refills | Status: DC

## 2020-03-16 NOTE — Progress Notes (Signed)
Established patient visit  I,Anne Boltz,acting as a scribe for Lelon Huh, MD.,have documented all relevant documentation on the behalf of Lelon Huh, MD,as directed by  Lelon Huh, MD while in the presence of Lelon Huh, MD.   Patient: Steven Saunders.   DOB: Dec 02, 1950   69 y.o. Male  MRN: [id number] Visit Date: 03/16/2020  Today's healthcare provider: Lelon Huh, MD   Chief Complaint  Patient presents with   Follow-up   Hypertension   Subjective    HPI   Hypertension, follow-up  BP Readings from Last 3 Encounters:  03/16/20 130/81  02/15/20 (!) 154/96  09/15/19 (!) 168/89   Wt Readings from Last 3 Encounters:  03/16/20 254 lb (115.2 kg)  02/15/20 258 lb (117 kg)  09/15/19 243 lb (110.2 kg)     He was last seen for hypertension 4 weeks ago.  BP at that visit was 154/96. Management since that visit includes; Uncontrolled.  labs checked showing-sodium levels low at 132. Advised to stop the hydrochlorothiazide and start taking spironolactone in its place. He reports good compliance with treatment. He is not having side effects. none He is not exercising. He is adherent to low salt diet.   Outside blood pressures are is checking but blood pressure cuff is not reliable.  He does not smoke.  Use of agents associated with hypertension: none.   --------------------------------------------------------------------      Medications: Outpatient Medications Prior to Visit  Medication Sig   acetaminophen (TYLENOL) 500 MG tablet Take 500 mg by mouth daily as needed.    aspirin 81 MG chewable tablet Chew 1 tablet (81 mg total) by mouth 2 (two) times daily. (Patient taking differently: Chew 81 mg by mouth daily. )   carvedilol (COREG) 25 MG tablet TAKE 1 TABLET BY MOUTH TWICE DAILY   Cholecalciferol (VITAMIN D-3) 25 MCG (1000 UT) CAPS Take 1 capsule by mouth daily.   digoxin (LANOXIN) 0.125 MG tablet TAKE 1 TABLET BY MOUTH ONCE DAILY (Patient  taking differently: Take 0.125 mg by mouth daily. )   docusate sodium (COLACE) 100 MG capsule Take 100 mg by mouth daily.   glucosamine-chondroitin 500-400 MG tablet Take 1 tablet by mouth daily.   irbesartan-hydrochlorothiazide (AVALIDE) 300-12.5 MG tablet TAKE 1 TABLET BY MOUTH ONCE DAILY   loratadine (CLARITIN) 10 MG tablet Take 10 mg by mouth daily.     lovastatin (MEVACOR) 20 MG tablet TAKE 1 TABLET BY MOUTH ONCE EVERY EVENING   Lutein 40 MG CAPS Take 40 mg by mouth daily.   Multiple Vitamin (MULTIVITAMIN) tablet Take 1 tablet by mouth daily.     sildenafil (REVATIO) 20 MG tablet Take 20 mg by mouth daily as needed.   spironolactone (ALDACTONE) 25 MG tablet Take 1 tablet (25 mg total) by mouth daily. Take in place of hctz   tamsulosin (FLOMAX) 0.4 MG CAPS capsule TAKE 1 CAPSULE BY MOUTH ONCE DAILY   valACYclovir (VALTREX) 1000 MG tablet TAKE 2 TABLETS BY MOUTH EVERY 12 HOURS AS NEEDED AS DIRECTED   No facility-administered medications prior to visit.    Review of Systems  Constitutional: Negative for appetite change, chills and fever.  Respiratory: Negative for chest tightness, shortness of breath and wheezing.   Cardiovascular: Negative for chest pain and palpitations.  Gastrointestinal: Negative for abdominal pain, nausea and vomiting.      Objective    BP 130/81 (BP Location: Left Arm, Patient Position: Sitting, Cuff Size: Large)    Pulse 62  Temp (!) 96.8 F (36 C) (Other (Comment))    Resp 18    Wt 254 lb (115.2 kg)    SpO2 97%    BMI 34.45 kg/m    Physical Exam  General appearance: Overweight male, cooperative and in no acute distress Head: Normocephalic, without obvious abnormality, atraumatic Respiratory: Respirations even and unlabored, normal respiratory rate Extremities: All extremities are intact.  Skin: Skin color, texture, turgor normal. No rashes seen  Psych: Appropriate mood and affect. Neurologic: Mental status: Alert, oriented to person,  place, and time, thought content appropriate.     Assessment & Plan     1. Essential hypertension Well controlled with change from hctz to spironolactone.  - spironolactone (ALDACTONE) 25 MG tablet; Take 1 tablet (25 mg total) by mouth daily. Take in place of hctz  Dispense: 30 tablet; Refill: 1 - Renal function panel   He is getting 30 day refill this weekend. Will change to 90 day with August refill if electrolytes are stable.   No follow-ups on file.      The entirety of the information documented in the History of Present Illness, Review of Systems and Physical Exam were personally obtained by me. Portions of this information were initially documented by the CMA and reviewed by me for thoroughness and accuracy.      Lelon Huh, MD  [facility] [phone] (phone) [phone] (fax)  [place]

## 2020-03-17 LAB — RENAL FUNCTION PANEL
Albumin: 4.7 g/dL (ref 3.7–4.7)
BUN/Creatinine Ratio: 14 (ref 10–24)
BUN: 12 mg/dL (ref 8–27)
CO2: 25 mmol/L (ref 20–29)
Calcium: 9.3 mg/dL (ref 8.6–10.2)
Chloride: 91 mmol/L — ABNORMAL LOW (ref 96–106)
Creatinine, Ser: 0.83 mg/dL (ref 0.76–1.27)
GFR calc Af Amer: 102 mL/min/{1.73_m2} (ref >59)
GFR calc non Af Amer: 89 mL/min/{1.73_m2} (ref >59)
Glucose: 125 mg/dL — ABNORMAL HIGH (ref 65–99)
Phosphorus: 3.5 mg/dL (ref 2.8–4.1)
Potassium: 4.7 mmol/L (ref 3.5–5.2)
Sodium: 128 mmol/L — ABNORMAL LOW (ref 134–144)

## 2020-03-19 ENCOUNTER — Ambulatory Visit: Payer: Self-pay | Admitting: Urology

## 2020-03-21 ENCOUNTER — Ambulatory Visit: Payer: Self-pay | Admitting: Urology

## 2020-03-27 ENCOUNTER — Other Ambulatory Visit: Payer: Self-pay

## 2020-04-06 ENCOUNTER — Other Ambulatory Visit: Payer: Self-pay | Admitting: Family Medicine

## 2020-04-06 DIAGNOSIS — I10 Essential (primary) hypertension: Secondary | ICD-10-CM

## 2020-04-06 MED ORDER — SPIRONOLACTONE 25 MG PO TABS: 25.0000 mg | ORAL_TABLET | Freq: Every day | ORAL | 2 refills | Status: DC

## 2020-05-14 ENCOUNTER — Other Ambulatory Visit: Payer: Self-pay | Admitting: Family Medicine

## 2020-06-05 NOTE — Progress Notes (Signed)
Subjective:   Steven Rothman. is a 69 y.o. male who presents for Medicare Annual/Subsequent preventive examination.  I connected with Gwynn Burly today by telephone and verified that I am speaking with the correct person using two identifiers. Location patient: home Location provider: work Persons participating in the virtual visit: patient, provider.   I discussed the limitations, risks, security and privacy concerns of performing an evaluation and management service by telephone and the availability of in person appointments. I also discussed with the patient that there may be a patient responsible charge related to this service. The patient expressed understanding and verbally consented to this telephonic visit.    Interactive audio and video telecommunications were attempted between this provider and patient, however failed, due to patient having technical difficulties OR patient did not have access to video capability.  We continued and completed visit with audio only.   Review of Systems    N/A  Cardiac Risk Factors include: advanced age (>3men, >37 women);obesity (BMI >30kg/m2);hypertension;male gender;dyslipidemia     Objective:    There were no vitals filed for this visit. There is no height or weight on file to calculate BMI.  Advanced Directives 06/06/2020 05/04/2019 04/27/2019 03/16/2018 05/26/2017 03/03/2017 11/15/2016  Does Patient Have a Medical Advance Directive? Yes Yes Yes Yes Yes Yes Yes  Type of Paramedic of [place];Living will [place];Living will [place];Living will [place];Living will [place];Living will [place];Living will [place]  Does patient want to make changes to medical advance directive? - No - Patient declined No - Patient declined - - - -  Copy of [place] in Chart? Yes - validated most  recent copy scanned in chart (See row information) No - copy requested No - copy requested Yes Yes Yes No - copy requested    Current Medications (verified) Outpatient Encounter Medications as of 06/06/2020  Medication Sig  . acetaminophen (TYLENOL) 500 MG tablet Take 500 mg by mouth daily as needed.   Marland Kitchen aspirin 81 MG chewable tablet Chew 1 tablet (81 mg total) by mouth 2 (two) times daily. (Patient taking differently: Chew 81 mg by mouth daily. )  . carvedilol (COREG) 25 MG tablet TAKE 1 TABLET BY MOUTH TWICE DAILY  . Cholecalciferol (VITAMIN D-3) 25 MCG (1000 UT) CAPS Take 1 capsule by mouth daily.  . digoxin (LANOXIN) 0.125 MG tablet Take 1 tablet (0.125 mg total) by mouth daily.  Marland Kitchen docusate sodium (COLACE) 100 MG capsule Take 100 mg by mouth daily.  Marland Kitchen glucosamine-chondroitin 500-400 MG tablet Take 1 tablet by mouth daily.  . irbesartan-hydrochlorothiazide (AVALIDE) 300-12.5 MG tablet TAKE 1 TABLET BY MOUTH ONCE DAILY  . loratadine (CLARITIN) 10 MG tablet Take 10 mg by mouth daily.    Marland Kitchen lovastatin (MEVACOR) 20 MG tablet TAKE 1 TABLET BY MOUTH ONCE EVERY EVENING  . Multiple Vitamin (MULTIVITAMIN) tablet Take 1 tablet by mouth daily.    . sildenafil (REVATIO) 20 MG tablet Take 20 mg by mouth daily as needed.  Marland Kitchen spironolactone (ALDACTONE) 25 MG tablet Take 1 tablet (25 mg total) by mouth daily.  . tamsulosin (FLOMAX) 0.4 MG CAPS capsule TAKE 1 CAPSULE BY MOUTH ONCE DAILY  . valACYclovir (VALTREX) 1000 MG tablet TAKE 2 TABLETS BY MOUTH EVERY 12 HOURS AS NEEDED AS DIRECTED  . [DISCONTINUED] Lutein 40 MG CAPS Take 40 mg by mouth daily.   No facility-administered encounter  medications on file as of 06/06/2020.    Allergies (verified) Patient has no known allergies.   History: Past Medical History:  Diagnosis Date  . Cancer [facility])    prostate  . Hyperlipidemia   . Hypertension   . Permanent atrial fibrillation [facility])    Past Surgical History:  Procedure Laterality Date  . APPENDECTOMY     . CARDIAC CATHETERIZATION  2007  . COLONOSCOPY WITH PROPOFOL N/A 05/26/2017   Procedure: COLONOSCOPY WITH PROPOFOL;  Surgeon: [name], MD;  Location: [facility] ENDOSCOPY;  Service: Endoscopy;  Laterality: N/A;  . HERNIA REPAIR     1950's x 2  . JOINT REPLACEMENT Left 2014  . PROSTATE BIOPSY    . thumb surgery  1980   had staph infection and had to cut out the area  . TOTAL HIP ARTHROPLASTY Left 2014   Dr. Sabra Heck  . TOTAL HIP ARTHROPLASTY Right 05/04/2019   Procedure: TOTAL HIP ARTHROPLASTY ANTERIOR APPROACH;  Surgeon: [name], MD;  Location: [facility] ORS;  Service: Orthopedics;  Laterality: Right;   Family History  Problem Relation Age of Onset  . Hypertension Mother   . Heart attack Father   . Melanoma Father   . Prostate cancer Neg Hx   . Bladder Cancer Neg Hx   . Kidney cancer Neg Hx    Social History   Socioeconomic History  . Marital status: Married    Spouse name: Not on file  . Number of children: 2  . Years of education: Not on file  . Highest education level: Some college, no degree  Occupational History  . Occupation: Retired    Comment: Still drives school bus  Tobacco Use  . Smoking status: Former Smoker    Packs/day: 2.00    Years: 15.00    Pack years: 30.00    Types: Cigarettes    Quit date: 11/12/2009    Years since quitting: 10.5  . Smokeless tobacco: Never Used  . Tobacco comment: Pt states he quit around 2000?  Vaping Use  . Vaping Use: Never used  Substance and Sexual Activity  . Alcohol use: Yes    Alcohol/week: 4.0 - 8.0 standard drinks    Types: 4 - 8 Cans of beer per week    Comment: 1-2 beers 4xs a week  . Drug use: No  . Sexual activity: Yes    Birth control/protection: None  Other Topics Concern  . Not on file  Social History Narrative  . Not on file   Social Determinants of Health   Financial Resource Strain: Low Risk   . Difficulty of Paying Living Expenses: Not hard at all  Food Insecurity: No Food Insecurity  . Worried  About Charity fundraiser in the Last Year: Never true  . Ran Out of Food in the Last Year: Never true  Transportation Needs: No Transportation Needs  . Lack of Transportation (Medical): No  . Lack of Transportation (Non-Medical): No  Physical Activity: Inactive  . Days of Exercise per Week: 0 days  . Minutes of Exercise per Session: 0 min  Stress: No Stress Concern Present  . Feeling of Stress : Not at all  Social Connections: Moderately Isolated  . Frequency of Communication with Friends and Family: More than three times a week  . Frequency of Social Gatherings with Friends and Family: More than three times a week  . Attends Religious Services: Never  . Active Member of Clubs or Organizations: No  . Attends Archivist  Meetings: Never  . Marital Status: Married    Tobacco Counseling Counseling given: Not Answered Comment: Pt states he quit around 2000?   Clinical Intake:  Pre-visit preparation completed: Yes  Pain : No/denies pain     Nutritional Risks: None Diabetes: No  How often do you need to have someone help you when you read instructions, pamphlets, or other written materials from your doctor or pharmacy?: 1 - Never  Diabetic? No  Interpreter Needed?: No  Information entered by :: [facility], LPN   Activities of Daily Living In your present state of health, do you have any difficulty performing the following activities: 06/06/2020  Hearing? N  Vision? N  Difficulty concentrating or making decisions? N  Walking or climbing stairs? N  Dressing or bathing? N  Doing errands, shopping? N  Preparing Food and eating ? N  Using the Toilet? N  In the past six months, have you accidently leaked urine? N  Do you have problems with loss of bowel control? N  Managing your Medications? N  Managing your Finances? N  Housekeeping or managing your Housekeeping? N  Some recent data might be hidden    Patient Care Team: Birdie Sons, MD as PCP - General  (Family Medicine) Evans Lance, MD as PCP - Cardiology (Cardiology) Evans Lance, MD as Consulting Physician (Cardiology) Earnestine Leys, MD (Orthopedic Surgery) [name], MD as Consulting Physician (Orthopedic Surgery) Billey Co, MD as Consulting Physician (Urology) Odette Fraction [facility])  Indicate any recent Medical Services you may have received from other than [facility] providers in the past year (date may be approximate).     Assessment:   This is a routine wellness examination for [facility].  Hearing/Vision screen No exam data present  Dietary issues and exercise activities discussed: Current Exercise Habits: The patient does not participate in regular exercise at present, Exercise limited by: None identified  Goals    . Reduce Sugar Intake     Recommend decreasing sugar intake in daily diet. Pt to eat 1 serving of ice cream every other day, rather than daily.       Depression Screen PHQ 2/9 Scores 02/15/2020 03/16/2018 03/16/2018 03/03/2017 03/03/2017 01/10/2016  PHQ - 2 Score 0 0 0 0 0 0  PHQ- 9 Score - 0 - 0 - -    Fall Risk Fall Risk  06/06/2020 07/25/2019 03/16/2018 03/03/2017 01/10/2016  Falls in the past year? 0 0 No No No  Comment - Emmi Telephone Survey: data to providers prior to load - - -  Number falls in past yr: 0 - - - -  Injury with Fall? 0 - - - -    Any stairs in or around the home? Yes  If so, are there any without handrails? No  Home free of loose throw rugs in walkways, pet beds, electrical cords, etc? Yes  Adequate lighting in your home to reduce risk of falls? Yes   ASSISTIVE DEVICES UTILIZED TO PREVENT FALLS:  Life alert? No  Use of a cane, walker or w/c? No  Grab bars in the bathroom? Yes  Shower chair or bench in shower? No  Elevated toilet seat or a handicapped toilet? Yes    Cognitive Function:     6CIT Screen 06/06/2020 03/03/2017  What Year? 0 points 0 points  What month? 0 points 0 points  What time? 0 points 0 points   Count back from 20 0 points 0 points  Months in reverse 0  points 0 points  Repeat phrase 0 points 0 points  Total Score 0 0    Immunizations Immunization History  Administered Date(s) Administered  . Influenza-Unspecified 04/14/2017, 04/01/2018  . Moderna SARS-COVID-2 Vaccination 10/13/2019, 11/10/2019  . Pneumococcal Conjugate-13 04/14/2014  . Pneumococcal Polysaccharide-23 01/10/2016  . Tdap 04/14/2014    TDAP status: Up to date Flu Vaccine status: Declined, Education has been provided regarding the importance of this vaccine but patient still declined. Advised may receive this vaccine at local pharmacy or Health Dept. Aware to provide a copy of the vaccination record if obtained from local pharmacy or Health Dept. Verbalized acceptance and understanding. Pneumococcal vaccine status: Up to date Covid-19 vaccine status: Completed vaccines  Qualifies for Shingles Vaccine? Yes   Zostavax completed No   Shingrix Completed?: No.    Education has been provided regarding the importance of this vaccine. Patient has been advised to call insurance company to determine out of pocket expense if they have not yet received this vaccine. Advised may also receive vaccine at local pharmacy or Health Dept. Verbalized acceptance and understanding.  Screening Tests Health Maintenance  Topic Date Due  . INFLUENZA VACCINE  11/29/2020 (Originally 04/01/2020)  . COLONOSCOPY  05/26/2022  . TETANUS/TDAP  04/14/2024  . COVID-19 Vaccine  Completed  . Hepatitis C Screening  Completed  . PNA vac Low Risk Adult  Completed    Health Maintenance  There are no preventive care reminders to display for this patient.  Colorectal cancer screening: Completed 05/26/14. Repeat every 5 years  Lung Cancer Screening: (Low Dose CT Chest recommended if Age 47-80 years, 30 pack-year currently smoking OR have quit w/in 15years.) does not qualify.   Additional Screening:  Hepatitis C Screening: Up to date  Vision  Screening: Recommended annual ophthalmology exams for early detection of glaucoma and other disorders of the eye. Is the patient up to date with their annual eye exam?  Yes  Who is the provider or what is the name of the office in which the patient attends annual eye exams? Dr Dingeldein @ [place] If pt is not established with a provider, would they like to be referred to a provider to establish care? No .   Dental Screening: Recommended annual dental exams for proper oral hygiene  Community Resource Referral / Chronic Care Management: CRR required this visit?  No   CCM required this visit?  No      Plan:     I have personally reviewed and noted the following in the patient's chart:   . Medical and social history . Use of alcohol, tobacco or illicit drugs  . Current medications and supplements . Functional ability and status . Nutritional status . Physical activity . Advanced directives . List of other physicians . Hospitalizations, surgeries, and ER visits in previous 12 months . Vitals . Screenings to include cognitive, depression, and falls . Referrals and appointments  In addition, I have reviewed and discussed with patient certain preventive protocols, quality metrics, and best practice recommendations. A written personalized care plan for preventive services as well as general preventive health recommendations were provided to patient.     Nayan Proch [place], [place]   10/06/4268   Nurse Notes: Pt declined receiving a flu shot.

## 2020-06-06 ENCOUNTER — Other Ambulatory Visit: Payer: Self-pay

## 2020-06-06 ENCOUNTER — Ambulatory Visit (INDEPENDENT_AMBULATORY_CARE_PROVIDER_SITE_OTHER): Payer: PPO

## 2020-06-06 DIAGNOSIS — Z0000 Encounter for general adult medical examination without abnormal findings: Secondary | ICD-10-CM | POA: Diagnosis not present

## 2020-06-06 NOTE — Patient Instructions (Signed)
Steven Saunders , Thank you for taking time to come for your Medicare Wellness Visit. I appreciate your ongoing commitment to your health goals. Please review the following plan we discussed and let me know if I can assist you in the future.   Screening recommendations/referrals: Colonoscopy: Up to date, due 05/2022 Recommended yearly ophthalmology/optometry visit for glaucoma screening and checkup Recommended yearly dental visit for hygiene and checkup  Vaccinations: Influenza vaccine: Currently due, declined receiving. Pneumococcal vaccine: Completed series Tdap vaccine: Up to date, due 04/2024 Shingles vaccine: Shingrix discussed. Please contact your pharmacy for coverage information.     Advanced directives: Currently on file.  Conditions/risks identified: Recommend decreasing sugar intake. Pt to eat 1 small serving of ice cream every other day, rather than daily.   Next appointment: 08/14/20 @ 9:40 AM with Dr Caryn Section   Preventive Care 71 Years and Older, Male Preventive care refers to lifestyle choices and visits with your health care provider that can promote health and wellness. What does preventive care include?  A yearly physical exam. This is also called an annual well check.  Dental exams once or twice a year.  Routine eye exams. Ask your health care provider how often you should have your eyes checked.  Personal lifestyle choices, including:  Daily care of your teeth and gums.  Regular physical activity.  Eating a healthy diet.  Avoiding tobacco and drug use.  Limiting alcohol use.  Practicing safe sex.  Taking low doses of aspirin every day.  Taking vitamin and mineral supplements as recommended by your health care provider. What happens during an annual well check? The services and screenings done by your health care provider during your annual well check will depend on your age, overall health, lifestyle risk factors, and family history of disease. Counseling    Your health care provider may ask you questions about your:  Alcohol use.  Tobacco use.  Drug use.  Emotional well-being.  Home and relationship well-being.  Sexual activity.  Eating habits.  History of falls.  Memory and ability to understand (cognition).  Work and work Statistician. Screening  You may have the following tests or measurements:  Height, weight, and BMI.  Blood pressure.  Lipid and cholesterol levels. These may be checked every 5 years, or more frequently if you are over 89 years old.  Skin check.  Lung cancer screening. You may have this screening every year starting at age 82 if you have a 30-pack-year history of smoking and currently smoke or have quit within the past 15 years.  Fecal occult blood test (FOBT) of the stool. You may have this test every year starting at age 69.  Flexible sigmoidoscopy or colonoscopy. You may have a sigmoidoscopy every 5 years or a colonoscopy every 10 years starting at age 72.  Prostate cancer screening. Recommendations will vary depending on your family history and other risks.  Hepatitis C blood test.  Hepatitis B blood test.  Sexually transmitted disease (STD) testing.  Diabetes screening. This is done by checking your blood sugar (glucose) after you have not eaten for a while (fasting). You may have this done every 1-3 years.  Abdominal aortic aneurysm (AAA) screening. You may need this if you are a current or former smoker.  Osteoporosis. You may be screened starting at age 1 if you are at high risk. Talk with your health care provider about your test results, treatment options, and if necessary, the need for more tests. Vaccines  Your health care provider  may recommend certain vaccines, such as:  Influenza vaccine. This is recommended every year.  Tetanus, diphtheria, and acellular pertussis (Tdap, Td) vaccine. You may need a Td booster every 10 years.  Zoster vaccine. You may need this after age  83.  Pneumococcal 13-valent conjugate (PCV13) vaccine. One dose is recommended after age 14.  Pneumococcal polysaccharide (PPSV23) vaccine. One dose is recommended after age 78. Talk to your health care provider about which screenings and vaccines you need and how often you need them. This information is not intended to replace advice given to you by your health care provider. Make sure you discuss any questions you have with your health care provider. Document Released: 09/14/2015 Document Revised: 05/07/2016 Document Reviewed: 06/19/2015 Elsevier Interactive Patient Education  2017 [employer] Prevention in the Home Falls can cause injuries. They can happen to people of all ages. There are many things you can do to make your home safe and to help prevent falls. What can I do on the outside of my home?  Regularly fix the edges of walkways and driveways and fix any cracks.  Remove anything that might make you trip as you walk through a door, such as a raised step or threshold.  Trim any bushes or trees on the path to your home.  Use bright outdoor lighting.  Clear any walking paths of anything that might make someone trip, such as rocks or tools.  Regularly check to see if handrails are loose or broken. Make sure that both sides of any steps have handrails.  Any raised decks and porches should have guardrails on the edges.  Have any leaves, snow, or ice cleared regularly.  Use sand or salt on walking paths during winter.  Clean up any spills in your garage right away. This includes oil or grease spills. What can I do in the bathroom?  Use night lights.  Install grab bars by the toilet and in the tub and shower. Do not use towel bars as grab bars.  Use non-skid mats or decals in the tub or shower.  If you need to sit down in the shower, use a plastic, non-slip stool.  Keep the floor dry. Clean up any water that spills on the floor as soon as it happens.  Remove  soap buildup in the tub or shower regularly.  Attach bath mats securely with double-sided non-slip rug tape.  Do not have throw rugs and other things on the floor that can make you trip. What can I do in the bedroom?  Use night lights.  Make sure that you have a light by your bed that is easy to reach.  Do not use any sheets or blankets that are too big for your bed. They should not hang down onto the floor.  Have a firm chair that has side arms. You can use this for support while you get dressed.  Do not have throw rugs and other things on the floor that can make you trip. What can I do in the kitchen?  Clean up any spills right away.  Avoid walking on wet floors.  Keep items that you use a lot in easy-to-reach places.  If you need to reach something above you, use a strong step stool that has a grab bar.  Keep electrical cords out of the way.  Do not use floor polish or wax that makes floors slippery. If you must use wax, use non-skid floor wax.  Do not have throw rugs  and other things on the floor that can make you trip. What can I do with my stairs?  Do not leave any items on the stairs.  Make sure that there are handrails on both sides of the stairs and use them. Fix handrails that are broken or loose. Make sure that handrails are as long as the stairways.  Check any carpeting to make sure that it is firmly attached to the stairs. Fix any carpet that is loose or worn.  Avoid having throw rugs at the top or bottom of the stairs. If you do have throw rugs, attach them to the floor with carpet tape.  Make sure that you have a light switch at the top of the stairs and the bottom of the stairs. If you do not have them, ask someone to add them for you. What else can I do to help prevent falls?  Wear shoes that:  Do not have high heels.  Have rubber bottoms.  Are comfortable and fit you well.  Are closed at the toe. Do not wear sandals.  If you use a  stepladder:  Make sure that it is fully opened. Do not climb a closed stepladder.  Make sure that both sides of the stepladder are locked into place.  Ask someone to hold it for you, if possible.  Clearly mark and make sure that you can see:  Any grab bars or handrails.  First and last steps.  Where the edge of each step is.  Use tools that help you move around (mobility aids) if they are needed. These include:  Canes.  Walkers.  Scooters.  Crutches.  Turn on the lights when you go into a dark area. Replace any light bulbs as soon as they burn out.  Set up your furniture so you have a clear path. Avoid moving your furniture around.  If any of your floors are uneven, fix them.  If there are any pets around you, be aware of where they are.  Review your medicines with your doctor. Some medicines can make you feel dizzy. This can increase your chance of falling. Ask your doctor what other things that you can do to help prevent falls. This information is not intended to replace advice given to you by your health care provider. Make sure you discuss any questions you have with your health care provider. Document Released: 06/14/2009 Document Revised: 01/24/2016 Document Reviewed: 09/22/2014 Elsevier Interactive Patient Education  2017 [employer].

## 2020-07-19 ENCOUNTER — Other Ambulatory Visit: Payer: Self-pay | Admitting: Urology

## 2020-08-01 ENCOUNTER — Other Ambulatory Visit: Payer: Self-pay | Admitting: Urology

## 2020-08-01 DIAGNOSIS — R351 Nocturia: Secondary | ICD-10-CM

## 2020-08-01 DIAGNOSIS — N401 Enlarged prostate with lower urinary tract symptoms: Secondary | ICD-10-CM

## 2020-08-07 ENCOUNTER — Other Ambulatory Visit: Payer: Self-pay

## 2020-08-07 ENCOUNTER — Ambulatory Visit: Payer: PPO | Admitting: Internal Medicine

## 2020-08-07 VITALS — BP 130/84 | HR 66 | Ht 72.0 in | Wt 254.4 lb

## 2020-08-07 DIAGNOSIS — I10 Essential (primary) hypertension: Secondary | ICD-10-CM

## 2020-08-07 DIAGNOSIS — I4821 Permanent atrial fibrillation: Secondary | ICD-10-CM

## 2020-08-07 NOTE — Patient Instructions (Signed)

## 2020-08-07 NOTE — Progress Notes (Signed)
HPI [name] returns today for followup. He is a very pleasant 69 year old man with a history of hypertension, and atrial fibrillation. He continues to do well. He has retired from working full time and was working at the [employer] before [employer]. He denies chest pain, sob, or syncope. No edema. He admits to some dietary indiscretion and has not been able to lose weight. He has gained 20 lbs. He refuses to take systemic anti-coagulation.   No Known Allergies   Current Outpatient Medications  Medication Sig Dispense Refill  . acetaminophen (TYLENOL) 500 MG tablet Take 500 mg by mouth daily as needed.     Marland Kitchen aspirin 81 MG chewable tablet Chew 1 tablet (81 mg total) by mouth 2 (two) times daily. 60 tablet 0  . carvedilol (COREG) 25 MG tablet TAKE 1 TABLET BY MOUTH TWICE DAILY 180 tablet 4  . Cholecalciferol (VITAMIN D-3) 25 MCG (1000 UT) CAPS Take 1 capsule by mouth daily.    . digoxin (LANOXIN) 0.125 MG tablet Take 1 tablet (0.125 mg total) by mouth daily. 90 tablet 2  . docusate sodium (COLACE) 100 MG capsule Take 100 mg by mouth daily.    Marland Kitchen glucosamine-chondroitin 500-400 MG tablet Take 1 tablet by mouth daily.    . irbesartan-hydrochlorothiazide (AVALIDE) 300-12.5 MG tablet TAKE 1 TABLET BY MOUTH ONCE DAILY 90 tablet 1  . loratadine (CLARITIN) 10 MG tablet Take 10 mg by mouth daily.      Marland Kitchen lovastatin (MEVACOR) 20 MG tablet TAKE 1 TABLET BY MOUTH ONCE EVERY EVENING 90 tablet 2  . Multiple Vitamin (MULTIVITAMIN) tablet Take 1 tablet by mouth daily.      Marland Kitchen spironolactone (ALDACTONE) 25 MG tablet Take 1 tablet (25 mg total) by mouth daily. 90 tablet 2  . tadalafil (CIALIS) 20 MG tablet TAKE 1 TABLET BY MOUTH ONCE DAILY AS NEEDED ERECTILE DYSFUNCTION 15 tablet 3  . tamsulosin (FLOMAX) 0.4 MG CAPS capsule TAKE 1 CAPSULE BY MOUTH ONCE DAILY 90 capsule 0  . valACYclovir (VALTREX) 1000 MG tablet TAKE 2 TABLETS BY MOUTH EVERY 12 HOURS AS NEEDED AS DIRECTED 20 tablet 5   No current  facility-administered medications for this visit.     Past Medical History:  Diagnosis Date  . Cancer [name])    prostate  . Hyperlipidemia   . Hypertension   . Permanent atrial fibrillation (HCC)     ROS:   All systems reviewed and negative except as noted in the HPI.   Past Surgical History:  Procedure Laterality Date  . APPENDECTOMY    . CARDIAC CATHETERIZATION  2007  . COLONOSCOPY WITH PROPOFOL N/A 05/26/2017   Procedure: COLONOSCOPY WITH PROPOFOL;  Surgeon: [name], MD;  Location: [facility] END[facility]COPY;  Service: Endoscopy;  Laterality: N/A;  . HERNIA REPAIR     1950's x 2  . JOINT REPLACEMENT Left 2014  . PROSTATE BIOPSY    . thumb surgery  1980   had staph infection and had to cut out the area  . TOTAL HIP ARTHROPLASTY Left 2014   Dr. Sabra Heck  . TOTAL HIP ARTHROPLASTY Right 05/04/2019   Procedure: TOTAL HIP ARTHROPLASTY ANTERIOR APPROACH;  Surgeon: [name], MD;  Location: [facility] ORS;  Service: Orthopedics;  Laterality: Right;     Family History  Problem Relation Age of Onset  . Hypertension Mother   . Heart attack Father   . Melanoma Father   . Prostate cancer Neg Hx   . Bladder Cancer Neg Hx   .  Kidney cancer Neg Hx      Social History   Socioeconomic History  . Marital status: Married    Spouse name: Not on file  . Number of children: 2  . Years of education: Not on file  . Highest education level: Some college, no degree  Occupational History  . Occupation: Retired    Comment: Still drives school bus  Tobacco Use  . Smoking status: Former Smoker    Packs/day: 2.00    Years: 15.00    Pack years: 30.00    Types: Cigarettes    Quit date: 11/12/2009    Years since quitting: 10.7  . Smokeless tobacco: Never Used  . Tobacco comment: Pt states he quit around 2000?  Vaping Use  . Vaping Use: Never used  Substance and Sexual Activity  . Alcohol use: Yes    Alcohol/week: 4.0 - 8.0 standard drinks    Types: 4 - 8 Cans of beer per week    Comment:  1-2 beers 4xs a week  . Drug use: No  . Sexual activity: Yes    Birth control/protection: None  Other Topics Concern  . Not on file  Social History Narrative  . Not on file   Social Determinants of Health   Financial Resource Strain: Low Risk   . Difficulty of Paying Living Expenses: Not hard at all  Food Insecurity: No Food Insecurity  . Worried About Charity fundraiser in the Last Year: Never true  . Ran Out of Food in the Last Year: Never true  Transportation Needs: No Transportation Needs  . Lack of Transportation (Medical): No  . Lack of Transportation (Non-Medical): No  Physical Activity: Inactive  . Days of Exercise per Week: 0 days  . Minutes of Exercise per Session: 0 min  Stress: No Stress Concern Present  . Feeling of Stress : Not at all  Social Connections: Moderately Isolated  . Frequency of Communication with Friends and Family: More than three times a week  . Frequency of Social Gatherings with Friends and Family: More than three times a week  . Attends Religious Services: Never  . Active Member of Clubs or Organizations: No  . Attends Archivist Meetings: Never  . Marital Status: Married  Human resources officer Violence: Not At Risk  . Fear of Current or Ex-Partner: No  . Emotionally Abused: No  . Physically Abused: No  . Sexually Abused: No     BP 130/84   Pulse 66   Ht 6' (1.829 m)   Wt 254 lb 6.4 oz (115.4 kg)   SpO2 99%   BMI 34.50 kg/m   Physical Exam:  Well appearing NAD HEENT: Unremarkable Neck:  No JVD, no thyromegally Lymphatics:  No adenopathy Back:  No CVA tenderness Lungs:  Clear with no wheezes HEART:  Regular rate rhythm, no murmurs, no rubs, no clicks Abd:  soft, positive bowel sounds, no organomegally, no rebound, no guarding Ext:  2 plus pulses, no edema, no cyanosis, no clubbing Skin:  No rashes no nodules Neuro:  CN II through XII intact, motor grossly intact  EKG - atrial fib with a controlled VR,  RBBB  Assess/Plan: 1. Atrial fib - his VR is well controlled. He is asymptomatic. He still refuses to take an Pray or warfarin. 2. HTN -his bp is controlled. 3. Obesity - we discussed the improtance of weight loss. I discussed fasting and he noted that his son has lost 30 lbs. 4. Dyslipidemia - he will  continue his statin.  Carleene Overlie Charlie Seda,MD

## 2020-08-13 ENCOUNTER — Other Ambulatory Visit: Payer: Self-pay | Admitting: Family Medicine

## 2020-08-13 DIAGNOSIS — I10 Essential (primary) hypertension: Secondary | ICD-10-CM

## 2020-08-13 NOTE — Telephone Encounter (Signed)
Requested medication (s) are due for refill today:  Yes  Requested medication (s) are on the active medication list:  Yes  Future visit scheduled:  Yes  Last Refill: 02/17/20; #90/ RF x 1  Notes to clinic:  Pt. Has 6 mo. F/u appt. Tomorrow. (12/14)  Did not refill, in case dose/ recommendation changes.   Requested Prescriptions  Pending Prescriptions Disp Refills   irbesartan-hydrochlorothiazide (AVALIDE) 300-12.5 MG tablet [Pharmacy Med Name: IRBESARTAN-HCTZ 300-12.5 MG TAB] 90 tablet 1    Sig: TAKE 1 TABLET BY MOUTH ONCE DAILY      Cardiovascular: ARB + Diuretic Combos Failed - 08/13/2020 10:25 AM      Failed - Na in normal range and within 180 days    Sodium  Date Value Ref Range Status  03/16/2020 128 (L) 134 - 144 mmol/L Final  06/08/2013 131 (L) 136 - 145 mmol/L Final          Passed - K in normal range and within 180 days    Potassium  Date Value Ref Range Status  03/16/2020 4.7 3.5 - 5.2 mmol/L Final  06/08/2013 3.8 3.5 - 5.1 mmol/L Final          Passed - Cr in normal range and within 180 days    Creatinine  Date Value Ref Range Status  06/08/2013 0.93 0.60 - 1.30 mg/dL Final   Creatinine, Ser  Date Value Ref Range Status  03/16/2020 0.83 0.76 - 1.27 mg/dL Final          Passed - Ca in normal range and within 180 days    Calcium  Date Value Ref Range Status  03/16/2020 9.3 8.6 - 10.2 mg/dL Final   Calcium, Total  Date Value Ref Range Status  06/08/2013 8.1 (L) 8.5 - 10.1 mg/dL Final          Passed - Patient is not pregnant      Passed - Last BP in normal range    BP Readings from Last 1 Encounters:  08/07/20 130/84          Passed - Valid encounter within last 6 months    Recent Outpatient Visits           5 months ago Essential hypertension   [facility] Birdie Sons, MD   6 months ago Essential hypertension   [facility] Birdie Sons, MD   1 year ago Encounter for preoperative examination for  general surgical procedure   [facility] Birdie Sons, MD   2 years ago Essential hypertension   [facility] Birdie Sons, MD   2 years ago Essential hypertension   [place], Kirstie Peri, MD       Future Appointments             Tomorrow Caryn Section, Kirstie Peri, MD [facility], [place]   In 3 weeks Diamantina Providence, Herbert Seta, MD [facility]

## 2020-08-14 ENCOUNTER — Ambulatory Visit (INDEPENDENT_AMBULATORY_CARE_PROVIDER_SITE_OTHER): Payer: PPO | Admitting: Family Medicine

## 2020-08-14 ENCOUNTER — Encounter: Payer: Self-pay | Admitting: Family Medicine

## 2020-08-14 ENCOUNTER — Other Ambulatory Visit: Payer: Self-pay

## 2020-08-14 VITALS — BP 126/86 | HR 58 | Temp 98.0°F | Resp 16 | Ht 72.0 in | Wt 254.8 lb

## 2020-08-14 DIAGNOSIS — I10 Essential (primary) hypertension: Secondary | ICD-10-CM | POA: Diagnosis not present

## 2020-08-14 DIAGNOSIS — R7309 Other abnormal glucose: Secondary | ICD-10-CM | POA: Diagnosis not present

## 2020-08-14 DIAGNOSIS — R972 Elevated prostate specific antigen [PSA]: Secondary | ICD-10-CM

## 2020-08-14 DIAGNOSIS — E871 Hypo-osmolality and hyponatremia: Secondary | ICD-10-CM

## 2020-08-14 NOTE — Patient Instructions (Addendum)
•   Please review the attached list of medications and notify my office if there are any errors.    Start taking 81mg  enteric coated aspirin every day to reduce risk of heart attacks and strokes

## 2020-08-14 NOTE — Progress Notes (Signed)
Established patient visit   Patient: Steven Saunders.   DOB: 1950-09-12   69 y.o. Male  MRN: [id number] Visit Date: 08/14/2020  Today's healthcare provider: Lelon Huh, MD   Chief Complaint  Patient presents with  . Hypertension   Subjective    HPI  Hypertension, follow-up  BP Readings from Last 3 Encounters:  08/14/20 126/86  08/07/20 130/84  03/16/20 130/81   Wt Readings from Last 3 Encounters:  08/14/20 254 lb 12.8 oz (115.6 kg)  08/07/20 254 lb 6.4 oz (115.4 kg)  03/16/20 254 lb (115.2 kg)     He was last seen for hypertension 4 months ago.  BP at that visit was 130/81. Management since that visit includes continue same medications.  He reports excellent compliance with treatment. He is not having side effects.  He is following a Regular diet. He is exercising. Walks 2 miles every day.  He does not smoke.  Use of agents associated with hypertension: NSAIDS.   Outside blood pressures are stable. Symptoms: No chest pain No chest pressure  No palpitations No syncope  No dyspnea No orthopnea  No paroxysmal nocturnal dyspnea No lower extremity edema   Pertinent labs: Lab Results  Component Value Date   CHOL 141 02/15/2020   HDL 50 02/15/2020   LDLCALC 69 02/15/2020   TRIG 126 02/15/2020   CHOLHDL 2.8 02/15/2020   Lab Results  Component Value Date   NA 128 (L) 03/16/2020   K 4.7 03/16/2020   CREATININE 0.83 03/16/2020   GFRNONAA 89 03/16/2020   GFRAA 102 03/16/2020   GLUCOSE 125 (H) 03/16/2020     The 10-year ASCVD risk score [name], et al., 2013) is: 18.6%   ---------------------------------------------------------------------------------------------------   Social History   Tobacco Use  . Smoking status: Former Smoker    Packs/day: 2.00    Years: 15.00    Pack years: 30.00    Types: Cigarettes    Quit date: 11/12/2009    Years since quitting: 10.7  . Smokeless tobacco: Never Used  . Tobacco comment: Pt states he quit  around 2000?  Vaping Use  . Vaping Use: Never used  Substance Use Topics  . Alcohol use: Yes    Alcohol/week: 4.0 - 8.0 standard drinks    Types: 4 - 8 Cans of beer per week    Comment: 1-2 beers 4xs a week  . Drug use: No   No Known Allergies     Medications: Outpatient Medications Prior to Visit  Medication Sig  . acetaminophen (TYLENOL) 500 MG tablet Take 500 mg by mouth daily as needed.   Marland Kitchen aspirin 81 MG chewable tablet Chew 1 tablet (81 mg total) by mouth 2 (two) times daily.  . carvedilol (COREG) 25 MG tablet TAKE 1 TABLET BY MOUTH TWICE DAILY  . Cholecalciferol (VITAMIN D-3) 25 MCG (1000 UT) CAPS Take 1 capsule by mouth daily.  . digoxin (LANOXIN) 0.125 MG tablet Take 1 tablet (0.125 mg total) by mouth daily.  Marland Kitchen docusate sodium (COLACE) 100 MG capsule Take 100 mg by mouth daily.  Marland Kitchen glucosamine-chondroitin 500-400 MG tablet Take 1 tablet by mouth daily.  . irbesartan-hydrochlorothiazide (AVALIDE) 300-12.5 MG tablet TAKE 1 TABLET BY MOUTH ONCE DAILY  . loratadine (CLARITIN) 10 MG tablet Take 10 mg by mouth daily.  Marland Kitchen lovastatin (MEVACOR) 20 MG tablet TAKE 1 TABLET BY MOUTH ONCE EVERY EVENING  . Multiple Vitamin (MULTIVITAMIN) tablet Take 1 tablet by mouth daily.  Marland Kitchen spironolactone (  ALDACTONE) 25 MG tablet Take 1 tablet (25 mg total) by mouth daily.  . tadalafil (CIALIS) 20 MG tablet TAKE 1 TABLET BY MOUTH ONCE DAILY AS NEEDED ERECTILE DYSFUNCTION  . tamsulosin (FLOMAX) 0.4 MG CAPS capsule TAKE 1 CAPSULE BY MOUTH ONCE DAILY  . valACYclovir (VALTREX) 1000 MG tablet TAKE 2 TABLETS BY MOUTH EVERY 12 HOURS AS NEEDED AS DIRECTED   No facility-administered medications prior to visit.    Review of Systems  Constitutional: Negative.   Respiratory: Negative.   Cardiovascular: Negative.       Objective    BP 126/86 (BP Location: Left Arm, Patient Position: Sitting, Cuff Size: Large)   Pulse (!) 58   Temp 98 F (36.7 C) (Oral)   Resp 16   Ht 6' (1.829 m)   Wt 254 lb 12.8 oz  (115.6 kg)   SpO2 99%   BMI 34.56 kg/m   Physical Exam    General: Appearance:    Obese male in no acute distress  Eyes:    PERRL, conjunctiva/corneas clear, EOM's intact       Lungs:     Clear to auscultation bilaterally, respirations unlabored  Heart:    Bradycardic. Regular rhythm. No murmurs, rubs, or gallops.   MS:   All extremities are intact.   Neurologic:   Awake, alert, oriented x 3. No apparent focal neurological           defect.         Assessment & Plan     1. Essential hypertension Very well controlled.  - Renal function panel  2. Abnormal blood sugar  - Hemoglobin A1c  3. Hyponatremia Likely secondary to diuretics. Is asymptomatic. Has liberalized his salt intake.  - Renal function panel  4. Elevated PSA  - PSA        The entirety of the information documented in the History of Present Illness, Review of Systems and Physical Exam were personally obtained by me. Portions of this information were initially documented by the CMA and reviewed by me for thoroughness and accuracy.      Lelon Huh, MD  [facility] [phone] (phone) [phone] (fax)  [place]

## 2020-08-15 LAB — RENAL FUNCTION PANEL
Albumin: 4.8 g/dL — ABNORMAL HIGH (ref 3.7–4.7)
BUN/Creatinine Ratio: 14 (ref 10–24)
BUN: 11 mg/dL (ref 8–27)
CO2: 22 mmol/L (ref 20–29)
Calcium: 9.4 mg/dL (ref 8.6–10.2)
Chloride: 95 mmol/L — ABNORMAL LOW (ref 96–106)
Creatinine, Ser: 0.81 mg/dL (ref 0.76–1.27)
GFR calc Af Amer: 103 mL/min/{1.73_m2} (ref >59)
GFR calc non Af Amer: 89 mL/min/{1.73_m2} (ref >59)
Glucose: 129 mg/dL — ABNORMAL HIGH (ref 65–99)
Phosphorus: 3.8 mg/dL (ref 2.8–4.1)
Potassium: 4.8 mmol/L (ref 3.5–5.2)
Sodium: 130 mmol/L — ABNORMAL LOW (ref 134–144)

## 2020-08-15 LAB — HEMOGLOBIN A1C
Est. average glucose Bld gHb Est-mCnc: 123 mg/dL
Hgb A1c MFr Bld: 5.9 % — ABNORMAL HIGH (ref 4.8–5.6)

## 2020-08-15 LAB — PSA: Prostate Specific Ag, Serum: 7.3 ng/mL — ABNORMAL HIGH (ref 0.0–4.0)

## 2020-08-30 ENCOUNTER — Other Ambulatory Visit: Payer: Self-pay

## 2020-09-03 ENCOUNTER — Ambulatory Visit: Payer: Self-pay | Admitting: Urology

## 2020-09-06 ENCOUNTER — Ambulatory Visit: Payer: PPO | Admitting: Urology

## 2020-09-06 ENCOUNTER — Other Ambulatory Visit: Payer: Self-pay | Admitting: Family Medicine

## 2020-09-06 ENCOUNTER — Other Ambulatory Visit: Payer: Self-pay

## 2020-09-06 ENCOUNTER — Encounter: Payer: Self-pay | Admitting: Urology

## 2020-09-06 VITALS — BP 134/81 | HR 68 | Ht 72.0 in | Wt 254.0 lb

## 2020-09-06 DIAGNOSIS — N401 Enlarged prostate with lower urinary tract symptoms: Secondary | ICD-10-CM | POA: Diagnosis not present

## 2020-09-06 DIAGNOSIS — C61 Malignant neoplasm of prostate: Secondary | ICD-10-CM | POA: Diagnosis not present

## 2020-09-06 DIAGNOSIS — N529 Male erectile dysfunction, unspecified: Secondary | ICD-10-CM | POA: Diagnosis not present

## 2020-09-06 DIAGNOSIS — N138 Other obstructive and reflux uropathy: Secondary | ICD-10-CM | POA: Diagnosis not present

## 2020-09-06 NOTE — Progress Notes (Signed)
   09/06/2020 1:47 PM   Steven Saunders. Dec 15, 1950 [id number]  Reason for visit: Follow up prostate cancer, BPH, ED  HPI: I saw Steven Saunders back in urology clinic todayfor active surveillance for favorable intermediate risk prostate cancer. He is a24 year old male with a cardiac history, mild lower urinary tract symptoms on BPH, and erectile dysfunction on Cialis.He originally underwent a prostate biopsy for an elevated PSA of 7.3 with Dr. Sherryl Barters in November 2018. This showed low risk disease with Gleason score 3+3=6 in 8/12 cores, with max core involvement of 37%. He elected for active surveillance with confirmatory biopsy. He underwent a confirmatory biopsy with myself 07/20/2018,with PSA of 6.3. This showed primarily Gleason score 3+3 =6 in 9/12 cores, however there was a single core of Gleason score 3+4 = 7 with grade 4 comprising 10% of the tumor. Max core involvement overall was 40%.Prostate volume was 51 g, and PSA density was 0.12.  We had discussed the AUA guidelines regarding low risk, intermediate risk, and high risk disease and treatment strategies including active surveillance versus intervention with surgery or radiation at our last visit. He was adamant about remaining on active surveillance.  We again reviewed that active surveillance needs to be chosen very carefully for favorable intermediate risk patients, and there is a higher risk of developing metastatic disease while on surveillance.  He understands these risks and would like to continue active surveillance.  PSA is 7.3 this year which is improved from the last 1 to 2 years where it has varied from 8-8. PSA was 7.3 at time of diagnosis.  DRE today notable for a 50 g gland with no masses or nodules.  We again reviewed the role of MRI for further risk stratification, but he would like to hold off at this time.  Regarding his urinary symptoms, they are very well controlled on Flomax and he has no urinary  complaints.  Cialis 20 mg on demand is working for his ED, and he has no complaints.  -Continue active surveillance for favorable intermediate risk prostate cancer, he would like to follow-up on a yearly basis with PSA prior -Continue Flomax and Cialis  Sondra Come, MD  [facility] [address] [place], [place] [address] [phone]

## 2020-09-06 NOTE — Patient Instructions (Signed)
1 year with PSA prior

## 2020-09-13 ENCOUNTER — Ambulatory Visit: Payer: Self-pay | Admitting: Urology

## 2020-11-09 ENCOUNTER — Other Ambulatory Visit: Payer: Self-pay | Admitting: Family Medicine

## 2020-11-28 DIAGNOSIS — Z96641 Presence of right artificial hip joint: Secondary | ICD-10-CM | POA: Diagnosis not present

## 2020-11-28 DIAGNOSIS — M7062 Trochanteric bursitis, left hip: Secondary | ICD-10-CM | POA: Diagnosis not present

## 2020-11-28 DIAGNOSIS — Z9889 Other specified postprocedural states: Secondary | ICD-10-CM | POA: Diagnosis not present

## 2021-01-26 IMAGING — XA DG HIP (WITH PELVIS) OPERATIVE*R*
2 series · 2 of 2 positions shown · non-contrast
Comparison: Pelvic radiograph dated 06/07/2013

CLINICAL DATA: 68-year-old male with total right hip arthroplasty.

EXAM:
OPERATIVE right HIP (WITH PELVIS IF PERFORMED) 1 VIEW
TECHNIQUE: Fluoroscopic spot image(s) were submitted for interpretation
post-operatively.

[Series 6: cont. · 1 of 1 slices shown (1 of 2)]
[im 1/1]
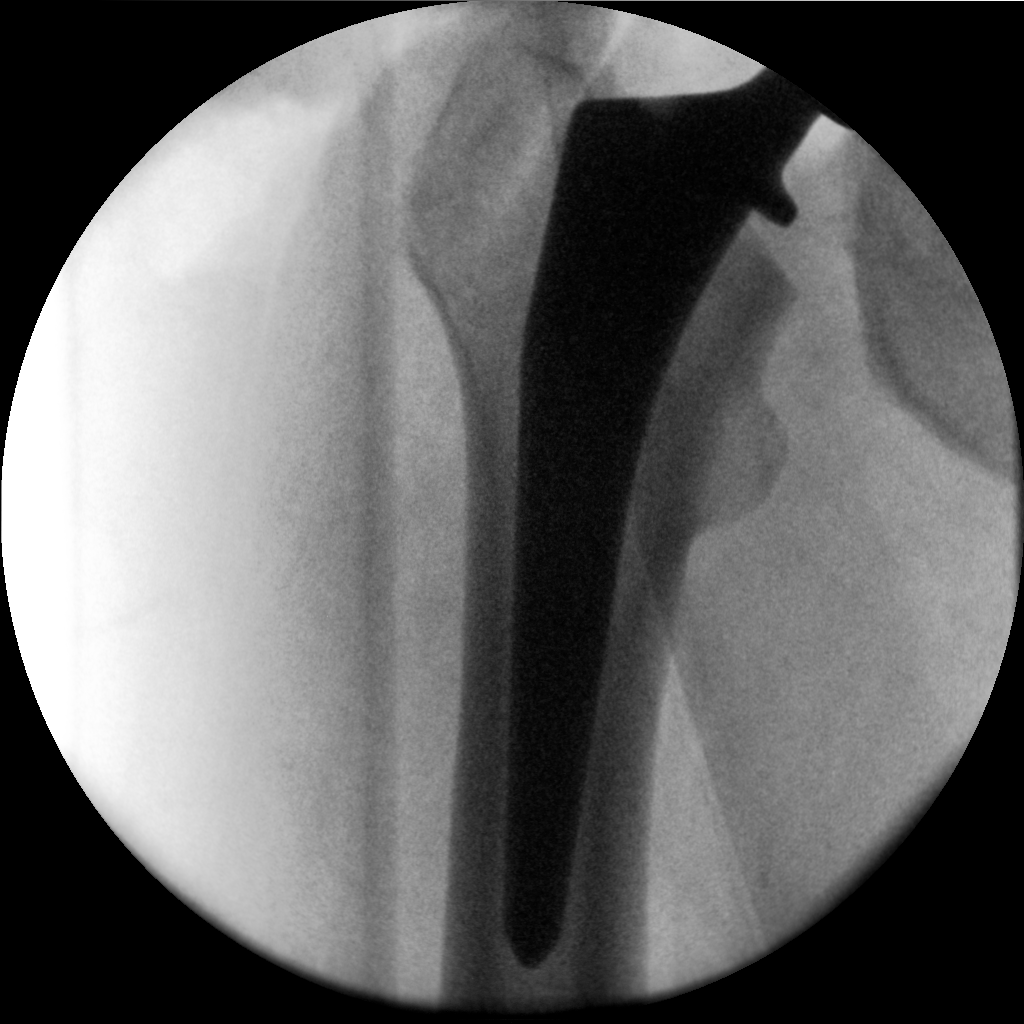

[Series 7: cont. · 1 of 1 slices shown (2 of 2)]
[im 1/1]
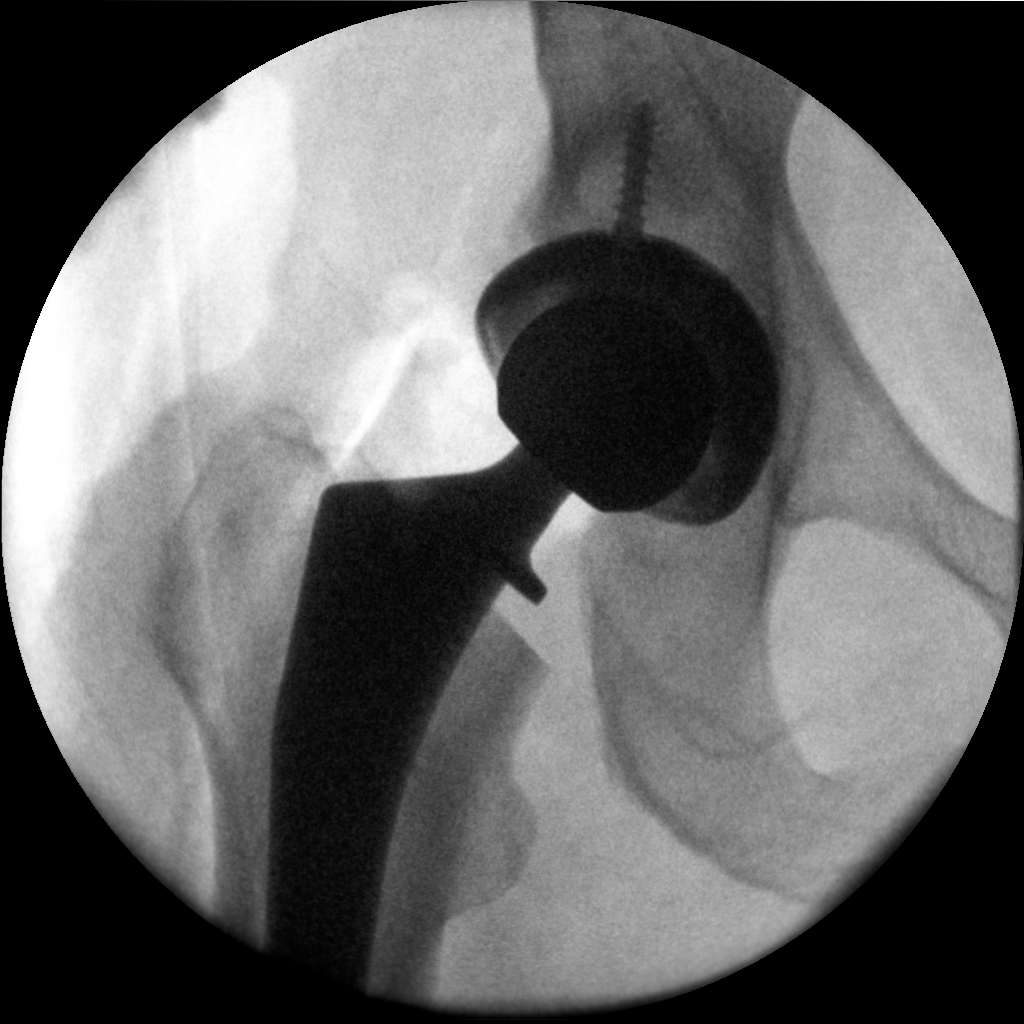

[2 of 2 positions shown; findings below may reference images not displayed]

FINDINGS: 2 intraoperative fluoroscopy images in AP view provided. Total
fluoroscopic time is 19 seconds.

There is a total right hip arthroplasty. The arthroplasty appears
intact without evidence of complication on the provided images.
IMPRESSION: Right total hip arthroplasty.

## 2021-02-05 ENCOUNTER — Other Ambulatory Visit: Payer: Self-pay | Admitting: Family Medicine

## 2021-02-05 ENCOUNTER — Other Ambulatory Visit: Payer: Self-pay | Admitting: Urology

## 2021-02-05 DIAGNOSIS — N401 Enlarged prostate with lower urinary tract symptoms: Secondary | ICD-10-CM

## 2021-02-05 DIAGNOSIS — I10 Essential (primary) hypertension: Secondary | ICD-10-CM

## 2021-03-05 ENCOUNTER — Other Ambulatory Visit: Payer: Self-pay | Admitting: Family Medicine

## 2021-03-05 DIAGNOSIS — I10 Essential (primary) hypertension: Secondary | ICD-10-CM

## 2021-03-26 NOTE — Progress Notes (Signed)
Established patient visit   Patient: Steven Saunders.   DOB: 1950-09-25   70 y.o. Male  MRN: [id number] Visit Date: 03/27/2021  Today's healthcare provider: Lelon Huh, MD   Chief Complaint  Patient presents with   Hyperglycemia   Hypertension   Subjective    HPI  Hyperglycemia, Follow-up  Lab Results  Component Value Date   HGBA1C 5.7 (A) 03/27/2021   HGBA1C 5.9 (H) 08/14/2020   HGBA1C 5.7 (H) 02/15/2020   GLUCOSE 129 (H) 08/14/2020   GLUCOSE 125 (H) 03/16/2020   GLUCOSE 138 (H) 02/15/2020    Last seen for for this7 months ago.  Management since that visit includes advising patient to work on staying away from sweets, exercising and keeping your weight down. Current symptoms include none and have been stable.  Prior visit with dietician: no Current diet: in general, an "unhealthy" diet Current exercise: none  Pertinent Labs:    Component Value Date/Time   CHOL 141 02/15/2020 0921   TRIG 126 02/15/2020 0921   CHOLHDL 2.8 02/15/2020 0921   CREATININE 0.81 08/14/2020 1015   CREATININE 0.93 06/08/2013 0416    Wt Readings from Last 3 Encounters:  03/27/21 264 lb 12.8 oz (120.1 kg)  09/06/20 254 lb (115.2 kg)  08/14/20 254 lb 12.8 oz (115.6 kg)    -----------------------------------------------------------------------------------------   Hypertension, follow-up  BP Readings from Last 3 Encounters:  03/27/21 137/85  09/06/20 134/81  08/14/20 126/86   Wt Readings from Last 3 Encounters:  03/27/21 264 lb 12.8 oz (120.1 kg)  09/06/20 254 lb (115.2 kg)  08/14/20 254 lb 12.8 oz (115.6 kg)     He was last seen for hypertension 7 months ago.  BP at that visit was 126/86. Management since that visit includes ordering labs which showed a low sodium level.  He reports good compliance with treatment. He is not having side effects.  He is following a Regular diet. He is not exercising. He does not smoke.  Use of agents associated with  hypertension: NSAIDS.   Outside blood pressures are occasionally checked (patient unsure of the average readings). Symptoms: No chest pain No chest pressure  No palpitations No syncope  No dyspnea No orthopnea  No paroxysmal nocturnal dyspnea No lower extremity edema   Pertinent labs: Lab Results  Component Value Date   CHOL 141 02/15/2020   HDL 50 02/15/2020   LDLCALC 69 02/15/2020   TRIG 126 02/15/2020   CHOLHDL 2.8 02/15/2020   Lab Results  Component Value Date   NA 130 (L) 08/14/2020   K 4.8 08/14/2020   CREATININE 0.81 08/14/2020   GFRNONAA 89 08/14/2020   GFRAA 103 08/14/2020   GLUCOSE 129 (H) 08/14/2020     The 10-year ASCVD risk score [name], et al., 2013) is: 22.9%   ---------------------------------------------------------------------------------------------------      Medications: Outpatient Medications Prior to Visit  Medication Sig   acetaminophen (TYLENOL) 500 MG tablet Take 500 mg by mouth daily as needed.    aspirin 81 MG chewable tablet Chew 1 tablet (81 mg total) by mouth 2 (two) times daily.   carvedilol (COREG) 25 MG tablet TAKE 1 TABLET BY MOUTH TWICE DAILY   Cholecalciferol (VITAMIN D-3) 25 MCG (1000 UT) CAPS Take 1 capsule by mouth daily.   digoxin (LANOXIN) 0.125 MG tablet TAKE ONE TABLET BY MOUTH DAILY   docusate sodium (COLACE) 100 MG capsule Take 100 mg by mouth daily.   glucosamine-chondroitin 500-400 MG tablet Take  1 tablet by mouth daily.   irbesartan-hydrochlorothiazide (AVALIDE) 300-12.5 MG tablet TAKE 1 TABLET BY MOUTH ONCE DAILY   loratadine (CLARITIN) 10 MG tablet Take 10 mg by mouth daily.   lovastatin (MEVACOR) 20 MG tablet TAKE 1 TABLET BY MOUTH ONCE EVERY EVENING   Multiple Vitamin (MULTIVITAMIN) tablet Take 1 tablet by mouth daily.   spironolactone (ALDACTONE) 25 MG tablet Take 1 tablet (25 mg total) by mouth daily.   tadalafil (CIALIS) 20 MG tablet TAKE 1 TABLET BY MOUTH ONCE DAILY AS NEEDED ERECTILE DYSFUNCTION    tamsulosin (FLOMAX) 0.4 MG CAPS capsule TAKE 1 CAPSULE BY MOUTH ONCE DAILY   valACYclovir (VALTREX) 1000 MG tablet TAKE 2 TABLETS BY MOUTH EVERY 12 HOURS AS NEEDED AS DIRECTED   No facility-administered medications prior to visit.    Review of Systems  Constitutional:  Negative for appetite change, chills and fever.  Respiratory:  Negative for chest tightness, shortness of breath and wheezing.   Cardiovascular:  Negative for chest pain and palpitations.  Gastrointestinal:  Negative for abdominal pain, nausea and vomiting.      Objective    BP 137/85 (BP Location: Left Arm, Patient Position: Sitting, Cuff Size: Large)   Pulse (!) 50   Temp 97.7 F (36.5 C) (Temporal)   Resp 18   Wt 264 lb 12.8 oz (120.1 kg)   BMI 35.91 kg/m     Physical Exam   General: Appearance:    Obese male in no acute distress  Eyes:    PERRL, conjunctiva/corneas clear, EOM's intact       Lungs:     Clear to auscultation bilaterally, respirations unlabored  Heart:    Bradycardic. Regular rhythm. No murmurs, rubs, or gallops.    MS:   All extremities are intact.    Neurologic:   Awake, alert, oriented x 3. No apparent focal neurological defect.         Results for orders placed or performed in visit on 03/27/21  POCT HgB A1C  Result Value Ref Range   Hemoglobin A1C 5.7 (A) 4.0 - 5.6 %   Est. average glucose Bld gHb Est-mCnc 117     Assessment & Plan     1. Abnormal blood sugar Doing much better. Doing well with diet   2. Essential hypertension Very well controlled, Continue current medications.   - CBC - Comprehensive metabolic panel - Lipid panel  3. Elevated PSA  - PSA   Continue annual follow up with Dr. Lovena Le next due in December.       The entirety of the information documented in the History of Present Illness, Review of Systems and Physical Exam were personally obtained by me. Portions of this information were initially documented by the CMA and reviewed by me for thoroughness  and accuracy.     Lelon Huh, MD  [facility] [phone] (phone) [phone] (fax)  [place]

## 2021-03-27 ENCOUNTER — Ambulatory Visit (INDEPENDENT_AMBULATORY_CARE_PROVIDER_SITE_OTHER): Payer: PPO | Admitting: Family Medicine

## 2021-03-27 ENCOUNTER — Encounter: Payer: Self-pay | Admitting: Family Medicine

## 2021-03-27 ENCOUNTER — Other Ambulatory Visit: Payer: Self-pay

## 2021-03-27 VITALS — BP 137/85 | HR 50 | Temp 97.7°F | Resp 18 | Wt 264.8 lb

## 2021-03-27 DIAGNOSIS — R7309 Other abnormal glucose: Secondary | ICD-10-CM

## 2021-03-27 DIAGNOSIS — R972 Elevated prostate specific antigen [PSA]: Secondary | ICD-10-CM | POA: Diagnosis not present

## 2021-03-27 DIAGNOSIS — I4821 Permanent atrial fibrillation: Secondary | ICD-10-CM | POA: Diagnosis not present

## 2021-03-27 DIAGNOSIS — I10 Essential (primary) hypertension: Secondary | ICD-10-CM

## 2021-03-27 LAB — POCT GLYCOSYLATED HEMOGLOBIN (HGB A1C)
Est. average glucose Bld gHb Est-mCnc: 117
Hemoglobin A1C: 5.7 % — AB (ref 4.0–5.6)

## 2021-03-27 NOTE — Patient Instructions (Signed)
.   Please review the attached list of medications and notify my office if there are any errors.   . Please bring all of your medications to every appointment so we can make sure that our medication list is the same as yours.   

## 2021-03-28 LAB — COMPREHENSIVE METABOLIC PANEL
ALT: 22 IU/L (ref 0–44)
AST: 21 IU/L (ref 0–40)
Albumin/Globulin Ratio: 2.4 — ABNORMAL HIGH (ref 1.2–2.2)
Albumin: 4.8 g/dL — ABNORMAL HIGH (ref 3.7–4.7)
Alkaline Phosphatase: 66 IU/L (ref 44–121)
BUN/Creatinine Ratio: 18 (ref 10–24)
BUN: 14 mg/dL (ref 8–27)
Bilirubin Total: 0.9 mg/dL (ref 0.0–1.2)
CO2: 24 mmol/L (ref 20–29)
Calcium: 9.4 mg/dL (ref 8.6–10.2)
Chloride: 94 mmol/L — ABNORMAL LOW (ref 96–106)
Creatinine, Ser: 0.76 mg/dL (ref 0.76–1.27)
Globulin, Total: 2 g/dL (ref 1.5–4.5)
Glucose: 135 mg/dL — ABNORMAL HIGH (ref 65–99)
Potassium: 5.3 mmol/L — ABNORMAL HIGH (ref 3.5–5.2)
Sodium: 129 mmol/L — ABNORMAL LOW (ref 134–144)
Total Protein: 6.8 g/dL (ref 6.0–8.5)
eGFR: 95 mL/min/{1.73_m2} (ref >59)

## 2021-03-28 LAB — LIPID PANEL
Chol/HDL Ratio: 2.6 ratio (ref 0.0–5.0)
Cholesterol, Total: 132 mg/dL (ref 100–199)
HDL: 51 mg/dL (ref >39)
LDL Chol Calc (NIH): 60 mg/dL (ref 0–99)
Triglycerides: 117 mg/dL (ref 0–149)
VLDL Cholesterol Cal: 21 mg/dL (ref 5–40)

## 2021-03-28 LAB — CBC
Hematocrit: 40.7 % (ref 37.5–51.0)
Hemoglobin: 14 g/dL (ref 13.0–17.7)
MCH: 34.2 pg — ABNORMAL HIGH (ref 26.6–33.0)
MCHC: 34.4 g/dL (ref 31.5–35.7)
MCV: 100 fL — ABNORMAL HIGH (ref 79–97)
Platelets: 138 10*3/uL — ABNORMAL LOW (ref 150–450)
RBC: 4.09 x10E6/uL — ABNORMAL LOW (ref 4.14–5.80)
RDW: 12.9 % (ref 11.6–15.4)
WBC: 6.5 10*3/uL (ref 3.4–10.8)

## 2021-03-28 LAB — PSA: Prostate Specific Ag, Serum: 9.7 ng/mL — ABNORMAL HIGH (ref 0.0–4.0)

## 2021-04-03 ENCOUNTER — Other Ambulatory Visit: Payer: Self-pay | Admitting: Family Medicine

## 2021-04-03 DIAGNOSIS — I10 Essential (primary) hypertension: Secondary | ICD-10-CM

## 2021-04-03 MED ORDER — SPIRONOLACTONE 25 MG PO TABS: 25.0000 mg | ORAL_TABLET | Freq: Every day | ORAL | 3 refills | Status: DC

## 2021-04-10 DIAGNOSIS — H35363 Drusen (degenerative) of macula, bilateral: Secondary | ICD-10-CM | POA: Diagnosis not present

## 2021-04-30 ENCOUNTER — Other Ambulatory Visit: Payer: Self-pay | Admitting: Family Medicine

## 2021-04-30 DIAGNOSIS — I10 Essential (primary) hypertension: Secondary | ICD-10-CM

## 2021-04-30 MED ORDER — IRBESARTAN 150 MG PO TABS: 150.0000 mg | ORAL_TABLET | Freq: Every day | ORAL | 0 refills | Status: DC

## 2021-04-30 NOTE — Progress Notes (Signed)
Change irbesartan-hctz to irbesartan 150 as per recent result note.

## 2021-05-09 ENCOUNTER — Other Ambulatory Visit: Payer: Self-pay | Admitting: Family Medicine

## 2021-05-25 ENCOUNTER — Other Ambulatory Visit: Payer: Self-pay | Admitting: Urology

## 2021-05-25 DIAGNOSIS — N401 Enlarged prostate with lower urinary tract symptoms: Secondary | ICD-10-CM

## 2021-06-20 DIAGNOSIS — H35363 Drusen (degenerative) of macula, bilateral: Secondary | ICD-10-CM | POA: Diagnosis not present

## 2021-08-05 ENCOUNTER — Other Ambulatory Visit: Payer: Self-pay | Admitting: Family Medicine

## 2021-08-05 ENCOUNTER — Other Ambulatory Visit: Payer: Self-pay | Admitting: Urology

## 2021-08-05 DIAGNOSIS — N401 Enlarged prostate with lower urinary tract symptoms: Secondary | ICD-10-CM

## 2021-08-05 DIAGNOSIS — R351 Nocturia: Secondary | ICD-10-CM

## 2021-08-05 DIAGNOSIS — I10 Essential (primary) hypertension: Secondary | ICD-10-CM

## 2021-08-06 NOTE — Telephone Encounter (Signed)
Requested medication (s) are on the active medication list: yes  Future visit scheduled: no  Notes to clinic:  Failed protocol of labs (digoxin level) within 180 days, no upcoming appt, please assess.    Requested Prescriptions  Pending Prescriptions Disp Refills   digoxin (LANOXIN) 0.125 MG tablet [Pharmacy Med Name: DIGOXIN 125 MCG TAB] 90 tablet 1    Sig: TAKE ONE TABLET BY MOUTH DAILY     Cardiovascular:  Antiarrhythmic Agents - digoxin Failed - 08/05/2021  5:24 PM      Failed - Digoxin (serum) in normal range and within 180 days    No results found for: DIGOXIN        Failed - K in normal range and within 180 days    Potassium  Date Value Ref Range Status  03/27/2021 5.3 (H) 3.5 - 5.2 mmol/L Final  06/08/2013 3.8 3.5 - 5.1 mmol/L Final          Passed - Cr in normal range and within 180 days    Creatinine  Date Value Ref Range Status  06/08/2013 0.93 0.60 - 1.30 mg/dL Final   Creatinine, Ser  Date Value Ref Range Status  03/27/2021 0.76 0.76 - 1.27 mg/dL Final          Passed - Ca in normal range and within 180 days    Calcium  Date Value Ref Range Status  03/27/2021 9.4 8.6 - 10.2 mg/dL Final   Calcium, Total  Date Value Ref Range Status  06/08/2013 8.1 (L) 8.5 - 10.1 mg/dL Final          Passed - Last Heart Rate in normal range    Pulse Readings from Last 1 Encounters:  03/27/21 (!) 50          Passed - Valid encounter within last 6 months    Recent Outpatient Visits           4 months ago Abnormal blood sugar   [facility] Birdie Sons, MD   11 months ago Essential hypertension   [facility] Birdie Sons, MD   1 year ago Essential hypertension   [facility] Birdie Sons, MD   1 year ago Essential hypertension   [facility] Birdie Sons, MD   2 years ago Encounter for preoperative examination for general surgical procedure   [facility] Birdie Sons,  MD       Future Appointments             In 1 month Diamantina Providence, Herbert Seta, MD [place]   In 1 month Evans Lance, MD [facility], [facility]            Signed Prescriptions Disp Refills   irbesartan (AVAPRO) 150 MG tablet 90 tablet 0    Sig: TAKE 1 TABLET BY MOUTH ONCE DAILY. *STOPTAKING 300/12.5     Cardiovascular:  Angiotensin Receptor Blockers Failed - 08/05/2021  5:24 PM      Failed - K in normal range and within 180 days    Potassium  Date Value Ref Range Status  03/27/2021 5.3 (H) 3.5 - 5.2 mmol/L Final  06/08/2013 3.8 3.5 - 5.1 mmol/L Final          Passed - Cr in normal range and within 180 days    Creatinine  Date Value Ref Range Status  06/08/2013 0.93 0.60 - 1.30 mg/dL Final   Creatinine, Ser  Date Value Ref  Range Status  03/27/2021 0.76 0.76 - 1.27 mg/dL Final          Passed - Patient is not pregnant      Passed - Last BP in normal range    BP Readings from Last 1 Encounters:  03/27/21 137/85          Passed - Valid encounter within last 6 months    Recent Outpatient Visits           4 months ago Abnormal blood sugar   [facility] Birdie Sons, MD   11 months ago Essential hypertension   [facility] Birdie Sons, MD   1 year ago Essential hypertension   [facility] Birdie Sons, MD   1 year ago Essential hypertension   [facility] Birdie Sons, MD   2 years ago Encounter for preoperative examination for general surgical procedure   [facility] Birdie Sons, MD       Future Appointments             In 1 month Diamantina Providence, Herbert Seta, MD [place]   In 1 month Evans Lance, MD [place], [facility]

## 2021-08-06 NOTE — Telephone Encounter (Signed)
Requested Prescriptions  Pending Prescriptions Disp Refills  . digoxin (LANOXIN) 0.125 MG tablet [Pharmacy Med Name: DIGOXIN 125 MCG TAB] 90 tablet 1    Sig: TAKE ONE TABLET BY MOUTH DAILY     Cardiovascular:  Antiarrhythmic Agents - digoxin Failed - 08/05/2021  5:24 PM      Failed - Digoxin (serum) in normal range and within 180 days    No results found for: DIGOXIN       Failed - K in normal range and within 180 days    Potassium  Date Value Ref Range Status  03/27/2021 5.3 (H) 3.5 - 5.2 mmol/L Final  06/08/2013 3.8 3.5 - 5.1 mmol/L Final         Passed - Cr in normal range and within 180 days    Creatinine  Date Value Ref Range Status  06/08/2013 0.93 0.60 - 1.30 mg/dL Final   Creatinine, Ser  Date Value Ref Range Status  03/27/2021 0.76 0.76 - 1.27 mg/dL Final         Passed - Ca in normal range and within 180 days    Calcium  Date Value Ref Range Status  03/27/2021 9.4 8.6 - 10.2 mg/dL Final   Calcium, Total  Date Value Ref Range Status  06/08/2013 8.1 (L) 8.5 - 10.1 mg/dL Final         Passed - Last Heart Rate in normal range    Pulse Readings from Last 1 Encounters:  03/27/21 (!) 50         Passed - Valid encounter within last 6 months    Recent Outpatient Visits          4 months ago Abnormal blood sugar   [facility] Birdie Sons, MD   11 months ago Essential hypertension   [facility] Birdie Sons, MD   1 year ago Essential hypertension   [facility] Birdie Sons, MD   1 year ago Essential hypertension   [facility] Birdie Sons, MD   2 years ago Encounter for preoperative examination for general surgical procedure   [facility] Birdie Sons, MD      Future Appointments            In 1 month Diamantina Providence, Herbert Seta, MD [place]   In 1 month Evans Lance, MD [place], [facility]           . irbesartan  (AVAPRO) 150 MG tablet [Pharmacy Med Name: IRBESARTAN 150 MG TAB] 90 tablet 0    Sig: TAKE 1 TABLET BY MOUTH ONCE DAILY. *STOPTAKING 300/12.5     Cardiovascular:  Angiotensin Receptor Blockers Failed - 08/05/2021  5:24 PM      Failed - K in normal range and within 180 days    Potassium  Date Value Ref Range Status  03/27/2021 5.3 (H) 3.5 - 5.2 mmol/L Final  06/08/2013 3.8 3.5 - 5.1 mmol/L Final         Passed - Cr in normal range and within 180 days    Creatinine  Date Value Ref Range Status  06/08/2013 0.93 0.60 - 1.30 mg/dL Final   Creatinine, Ser  Date Value Ref Range Status  03/27/2021 0.76 0.76 - 1.27 mg/dL Final         Passed - Patient is not pregnant      Passed - Last BP in normal range    BP Readings from Last 1 Encounters:  03/27/21 137/85         Passed - Valid encounter within last 6 months    Recent Outpatient Visits          4 months ago Abnormal blood sugar   [facility] Birdie Sons, MD   11 months ago Essential hypertension   [facility] Birdie Sons, MD   1 year ago Essential hypertension   [facility] Birdie Sons, MD   1 year ago Essential hypertension   [facility] Birdie Sons, MD   2 years ago Encounter for preoperative examination for general surgical procedure   [facility] Birdie Sons, MD      Future Appointments            In 1 month Diamantina Providence, Herbert Seta, MD [place]   In 1 month Evans Lance, MD [place], [facility]

## 2021-08-08 ENCOUNTER — Other Ambulatory Visit: Payer: Self-pay | Admitting: Family Medicine

## 2021-08-27 ENCOUNTER — Other Ambulatory Visit: Payer: Self-pay | Admitting: Urology

## 2021-08-27 DIAGNOSIS — R351 Nocturia: Secondary | ICD-10-CM

## 2021-08-29 ENCOUNTER — Other Ambulatory Visit: Payer: Self-pay | Admitting: Family Medicine

## 2021-09-04 ENCOUNTER — Other Ambulatory Visit: Payer: Self-pay

## 2021-09-04 DIAGNOSIS — C61 Malignant neoplasm of prostate: Secondary | ICD-10-CM

## 2021-09-06 ENCOUNTER — Other Ambulatory Visit: Payer: PPO

## 2021-09-06 ENCOUNTER — Other Ambulatory Visit: Payer: Self-pay

## 2021-09-06 DIAGNOSIS — C61 Malignant neoplasm of prostate: Secondary | ICD-10-CM | POA: Diagnosis not present

## 2021-09-07 LAB — PSA: Prostate Specific Ag, Serum: 8.5 ng/mL — ABNORMAL HIGH (ref 0.0–4.0)

## 2021-09-11 ENCOUNTER — Ambulatory Visit: Payer: PPO | Admitting: Urology

## 2021-09-11 ENCOUNTER — Encounter: Payer: Self-pay | Admitting: Urology

## 2021-09-11 ENCOUNTER — Other Ambulatory Visit: Payer: Self-pay

## 2021-09-11 VITALS — BP 137/85 | HR 76 | Ht 72.0 in | Wt 262.6 lb

## 2021-09-11 DIAGNOSIS — N529 Male erectile dysfunction, unspecified: Secondary | ICD-10-CM

## 2021-09-11 DIAGNOSIS — C61 Malignant neoplasm of prostate: Secondary | ICD-10-CM

## 2021-09-11 DIAGNOSIS — N401 Enlarged prostate with lower urinary tract symptoms: Secondary | ICD-10-CM | POA: Diagnosis not present

## 2021-09-11 DIAGNOSIS — N138 Other obstructive and reflux uropathy: Secondary | ICD-10-CM | POA: Diagnosis not present

## 2021-09-11 MED ORDER — TADALAFIL 20 MG PO TABS: ORAL_TABLET | ORAL | 6 refills | Status: DC

## 2021-09-11 NOTE — Progress Notes (Signed)
° °  09/11/2021 11:02 AM   Steven Saunders. Aug 21, 1951 [id number]  Reason for visit: Follow up prostate cancer, BPH, ED   HPI: I saw [name] back in urology clinic today for active surveillance for favorable intermediate risk prostate cancer.  He is a 71 year old male with a cardiac history, mild lower urinary tract symptoms on BPH, and erectile dysfunction on Cialis.  He originally underwent a prostate biopsy for an elevated PSA of 7.3 with Dr. Pilar Jarvis in November 2018.  This showed low risk disease with Gleason score 3+3=6 in 8/12 cores, with max core involvement of 37%.  He elected for active surveillance with confirmatory biopsy.  He underwent a confirmatory biopsy with myself 07/20/2018, with PSA of 6.3.  This showed primarily Gleason score 3+3=6 in 9/12 cores, however there was a single core of Gleason score 3+4=7 with grade 4 comprising 10% of the tumor.  Max core involvement overall was 40%.  Prostate volume was 51 g, and PSA density was 0.12.  After an extensive conversation and shared decision making, he opted for active surveillance.   We again discussed the AUA guidelines regarding low risk, intermediate risk, and high risk disease and treatment strategies including active surveillance versus intervention with surgery or radiation.  He remains adamant about remaining on active surveillance.  We again reviewed that active surveillance needs to be chosen very carefully for favorable intermediate risk patients, and there is a higher risk of developing metastatic disease while on surveillance.  He understands these risks and would like to continue active surveillance.  PSA is stable at 8.5 on 09/06/2021 which is decreased from 9.7 in July 2022, and stable from 8.7 in June 2021, and 8.9 in July 2021. DRE today limited by body habitus, but notable for a 50 g gland with no masses or nodules.    We had another discussion about alternatives for further risk stratification including prostate  MRI(artificial hip and would need clearance for MRI, unclear if compatible), Oncotype DX , or repeat prostate biopsy.  He remains very averse to biopsy, but I again stressed the importance of the role of biopsy at least every 3 to 5 years in patients with active surveillance, especially those with favorable intermediate risk disease.   Regarding his urinary symptoms, they are very well controlled on Flomax and he has no urinary complaints.   He is having moderate results with the Cialis 20 mg on demand, and we discussed changing over to 100 mg Viagra but he would like to continue the Cialis for now.   Continue active surveillance for prostate cancer, risks and benefits discussed extensively Continue Cialis for ED and Flomax for BPH, refill  Billey Co, MD  [place] [address] [place], [place] [address] [phone]

## 2021-10-03 ENCOUNTER — Ambulatory Visit: Payer: PPO | Admitting: Internal Medicine

## 2021-10-03 ENCOUNTER — Other Ambulatory Visit: Payer: Self-pay

## 2021-10-03 ENCOUNTER — Encounter: Payer: Self-pay | Admitting: Internal Medicine

## 2021-10-03 VITALS — BP 148/80 | HR 60 | Ht 72.0 in | Wt 259.0 lb

## 2021-10-03 DIAGNOSIS — I4821 Permanent atrial fibrillation: Secondary | ICD-10-CM

## 2021-10-03 DIAGNOSIS — I10 Essential (primary) hypertension: Secondary | ICD-10-CM | POA: Diagnosis not present

## 2021-10-03 NOTE — Progress Notes (Signed)
HPI Mr. Girtman returns today for followup. He is a very pleasant 71 year old man with a history of hypertension, and atrial fibrillation. He continues to do well. 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  He denies chest pain, sob, or syncope. No edema. He admits to some dietary indiscretion and has not been able to lose weight. He has gained 5 more lbs. He refuses to take systemic anti-coagulation.  No Known Allergies   Current Outpatient Medications  Medication Sig Dispense Refill   acetaminophen (TYLENOL) 500 MG tablet Take 500 mg by mouth daily as needed.      aspirin EC 81 MG tablet Take 81 mg by mouth daily. Swallow whole.     carvedilol (COREG) 25 MG tablet TAKE 1 TABLET BY MOUTH TWICE DAILY 180 tablet 4   Cholecalciferol (VITAMIN D-3) 25 MCG (1000 UT) CAPS Take 1 capsule by mouth daily.     digoxin (LANOXIN) 0.125 MG tablet TAKE ONE TABLET BY MOUTH DAILY 90 tablet 3   docusate sodium (COLACE) 100 MG capsule Take 100 mg by mouth daily.     glucosamine-chondroitin 500-400 MG tablet Take 1 tablet by mouth daily.     irbesartan (AVAPRO) 150 MG tablet TAKE 1 TABLET BY MOUTH ONCE DAILY. *STOPTAKING 300/12.5 90 tablet 0   loratadine (CLARITIN) 10 MG tablet Take 10 mg by mouth daily.     lovastatin (MEVACOR) 20 MG tablet TAKE 1 TABLET BY MOUTH ONCE EVERY EVENING 90 tablet 4   Multiple Vitamin (MULTIVITAMIN) tablet Take 1 tablet by mouth daily.     spironolactone (ALDACTONE) 25 MG tablet Take 1 tablet (25 mg total) by mouth daily. 90 tablet 3   tadalafil (CIALIS) 20 MG tablet TAKE 1 TABLET BY MOUTH ONCE DAILY AS NEEDED ERECTILE DYSFUNCTION 15 tablet 6   tamsulosin (FLOMAX) 0.4 MG CAPS capsule TAKE 1 CAPSULE BY MOUTH ONCE DAILY 90 capsule 0   valACYclovir (VALTREX) 1000 MG tablet TAKE 2 TABLETS BY MOUTH EVERY 12 HOURS AS NEEDED AS DIRECTED 20 tablet 2   No current facility-administered medications for this visit.     Past Medical  History:  Diagnosis Date   Permanent atrial fibrillation ([place])    Prostate cancer ([place])     ROS:   All systems reviewed and negative except as noted in the HPI.   Past Surgical History:  Procedure Laterality Date   APPENDECTOMY     CARDIAC CATHETERIZATION  2007   COLONOSCOPY WITH PROPOFOL N/A 05/26/2017   Procedure: COLONOSCOPY WITH PROPOFOL;  Surgeon: [name], MD;  Location: [facility]COPY;  Service: Endoscopy;  Laterality: N/A;   HERNIA REPAIR     1950's x 2   JOINT REPLACEMENT Left 2014   PROSTATE BIOPSY     thumb surgery  1980   had staph infection and had to cut out the area   [place] Left 2014   Dr. Sabra Heck   TOTAL HIP ARTHROPLASTY Right 05/04/2019   Procedure: [place];  Surgeon: [name], MD;  Location: [facility] ORS;  Service: Orthopedics;  Laterality: Right;     Family History  Problem Relation Age of Onset   Hypertension Mother    Heart attack Father    Melanoma Father    Prostate cancer Neg Hx    Bladder Cancer Neg Hx    Kidney cancer Neg Hx      Social History   Socioeconomic History  Marital status: Married    Spouse name: Not on file   Number of children: 2   Years of education: Not on file   Highest education level: Some college, no degree  Occupational History   Occupation: Retired    Comment: Still drives school bus  Tobacco Use   Smoking status: Former    Packs/day: 2.00    Years: 15.00    Pack years: 30.00    Types: Cigarettes    Quit date: 11/12/2009    Years since quitting: 11.8   Smokeless tobacco: Never   Tobacco comments:    Pt states he quit around 2000?  Vaping Use   Vaping Use: Never used  Substance and Sexual Activity   Alcohol use: Yes    Alcohol/week: 4.0 - 8.0 standard drinks    Types: 4 - 8 Cans of beer per week    Comment: 1-2 beers 4xs a week   Drug use: No   Sexual activity: Yes    Birth control/protection: None  Other Topics Concern   Not on file  Social  History Narrative   Not on file   Social Determinants of Health   Financial Resource Strain: Not on file  Food Insecurity: Not on file  Transportation Needs: Not on file  Physical Activity: Not on file  Stress: Not on file  Social Connections: Not on file  Intimate Partner Violence: Not on file     BP (!) 148/80    Pulse 60    Ht 6' (1.829 m)    Wt 259 lb (117.5 kg)    SpO2 99%    BMI 35.13 kg/m   Physical Exam:  Well appearing NAD HEENT: Unremarkable Neck:  No JVD, no thyromegally Lymphatics:  No adenopathy Back:  No CVA tenderness Lungs:  Clear, with no wheezes. HEART:  IRegular rate rhythm, no murmurs, no rubs, no clicks Abd:  soft, positive bowel sounds, no organomegally, no rebound, no guarding Ext:  2 plus pulses, no edema, no cyanosis, no clubbing Skin:  No rashes no nodules Neuro:  CN II through XII intact, motor grossly intact  EKG -atrial fibrillation with a controlled ventricular response and right bundle branch block   Assess/Plan:  1. Atrial fib - his VR is well controlled. He is asymptomatic. He still refuses to take an [place] or warfarin. 2. HTN -his bp is controlled. 3. Obesity - we discussed the improtance of weight loss. I discussed fasting again. 4. Dyslipidemia - he will continue his statin.   Carleene Overlie Mathilde Mcwherter,MD

## 2021-10-03 NOTE — Patient Instructions (Addendum)

## 2021-11-04 ENCOUNTER — Other Ambulatory Visit: Payer: Self-pay | Admitting: Family Medicine

## 2021-11-04 DIAGNOSIS — I10 Essential (primary) hypertension: Secondary | ICD-10-CM

## 2021-11-28 ENCOUNTER — Other Ambulatory Visit: Payer: Self-pay | Admitting: Urology

## 2021-11-28 DIAGNOSIS — N401 Enlarged prostate with lower urinary tract symptoms: Secondary | ICD-10-CM

## 2022-01-01 ENCOUNTER — Other Ambulatory Visit: Payer: Self-pay | Admitting: Family Medicine

## 2022-01-01 DIAGNOSIS — I10 Essential (primary) hypertension: Secondary | ICD-10-CM

## 2022-01-31 ENCOUNTER — Other Ambulatory Visit: Payer: Self-pay | Admitting: Family Medicine

## 2022-01-31 DIAGNOSIS — I10 Essential (primary) hypertension: Secondary | ICD-10-CM

## 2022-02-17 ENCOUNTER — Ambulatory Visit (INDEPENDENT_AMBULATORY_CARE_PROVIDER_SITE_OTHER): Payer: PPO

## 2022-02-17 VITALS — BP 140/78 | Ht 72.0 in | Wt 266.4 lb

## 2022-02-17 DIAGNOSIS — Z0000 Encounter for general adult medical examination without abnormal findings: Secondary | ICD-10-CM | POA: Diagnosis not present

## 2022-02-17 NOTE — Patient Instructions (Signed)
[name] , Thank you for taking time to come for your Medicare Wellness Visit. I appreciate your ongoing commitment to your health goals. Please review the following plan we discussed and let me know if I can assist you in the future.   Screening recommendations/referrals: Colonoscopy: 05/26/17, will schedule for October Recommended yearly ophthalmology/optometry visit for glaucoma screening and checkup Recommended yearly dental visit for hygiene and checkup  Vaccinations: Influenza vaccine: n/d Pneumococcal vaccine: 01/10/16 Tdap vaccine: 04/14/24 Shingles vaccine: n/d   Covid-19: 10/13/19, 11/10/19  Advanced directives: yes   Conditions/risks identified: none  Next appointment: Follow up in one year for your annual wellness visit. - declined  Preventive Care 1 Years and Older, Male Preventive care refers to lifestyle choices and visits with your health care provider that can promote health and wellness. What does preventive care include? A yearly physical exam. This is also called an annual well check. Dental exams once or twice a year. Routine eye exams. Ask your health care provider how often you should have your eyes checked. Personal lifestyle choices, including: Daily care of your teeth and gums. Regular physical activity. Eating a healthy diet. Avoiding tobacco and drug use. Limiting alcohol use. Practicing safe sex. Taking low doses of aspirin every day. Taking vitamin and mineral supplements as recommended by your health care provider. What happens during an annual well check? The services and screenings done by your health care provider during your annual well check will depend on your age, overall health, lifestyle risk factors, and family history of disease. Counseling  Your health care provider may ask you questions about your: Alcohol use. Tobacco use. Drug use. Emotional well-being. Home and relationship well-being. Sexual activity. Eating habits. History  of falls. Memory and ability to understand (cognition). Work and work Statistician. Screening  You may have the following tests or measurements: Height, weight, and BMI. Blood pressure. Lipid and cholesterol levels. These may be checked every 5 years, or more frequently if you are over 73 years old. Skin check. Lung cancer screening. You may have this screening every year starting at age 73 if you have a 30-pack-year history of smoking and currently smoke or have quit within the past 15 years. Fecal occult blood test (FOBT) of the stool. You may have this test every year starting at age 73. Flexible sigmoidoscopy or colonoscopy. You may have a sigmoidoscopy every 5 years or a colonoscopy every 10 years starting at age 73. Prostate cancer screening. Recommendations will vary depending on your family history and other risks. Hepatitis C blood test. Hepatitis B blood test. Sexually transmitted disease (STD) testing. Diabetes screening. This is done by checking your blood sugar (glucose) after you have not eaten for a while (fasting). You may have this done every 1-3 years. Abdominal aortic aneurysm (AAA) screening. You may need this if you are a current or former smoker. Osteoporosis. You may be screened starting at age 73 if you are at high risk. Talk with your health care provider about your test results, treatment options, and if necessary, the need for more tests. Vaccines  Your health care provider may recommend certain vaccines, such as: Influenza vaccine. This is recommended every year. Tetanus, diphtheria, and acellular pertussis (Tdap, Td) vaccine. You may need a Td booster every 10 years. Zoster vaccine. You may need this after age 73. Pneumococcal 13-valent conjugate (PCV13) vaccine. One dose is recommended after age 73. Pneumococcal polysaccharide (PPSV23) vaccine. One dose is recommended after age 73. Talk to your health care  provider about which screenings and vaccines you need  and how often you need them. This information is not intended to replace advice given to you by your health care provider. Make sure you discuss any questions you have with your health care provider. Document Released: 09/14/2015 Document Revised: 05/07/2016 Document Reviewed: 06/19/2015 Elsevier Interactive Patient Education  2017 [place] Prevention in the Home Falls can cause injuries. They can happen to people of all ages. There are many things you can do to make your home safe and to help prevent falls. What can I do on the outside of my home? Regularly fix the edges of walkways and driveways and fix any cracks. Remove anything that might make you trip as you walk through a door, such as a raised step or threshold. Trim any bushes or trees on the path to your home. Use bright outdoor lighting. Clear any walking paths of anything that might make someone trip, such as rocks or tools. Regularly check to see if handrails are loose or broken. Make sure that both sides of any steps have handrails. Any raised decks and porches should have guardrails on the edges. Have any leaves, snow, or ice cleared regularly. Use sand or salt on walking paths during winter. Clean up any spills in your garage right away. This includes oil or grease spills. What can I do in the bathroom? Use night lights. Install grab bars by the toilet and in the tub and shower. Do not use towel bars as grab bars. Use non-skid mats or decals in the tub or shower. If you need to sit down in the shower, use a plastic, non-slip stool. Keep the floor dry. Clean up any water that spills on the floor as soon as it happens. Remove soap buildup in the tub or shower regularly. Attach bath mats securely with double-sided non-slip rug tape. Do not have throw rugs and other things on the floor that can make you trip. What can I do in the bedroom? Use night lights. Make sure that you have a light by your bed that is easy  to reach. Do not use any sheets or blankets that are too big for your bed. They should not hang down onto the floor. Have a firm chair that has side arms. You can use this for support while you get dressed. Do not have throw rugs and other things on the floor that can make you trip. What can I do in the kitchen? Clean up any spills right away. Avoid walking on wet floors. Keep items that you use a lot in easy-to-reach places. If you need to reach something above you, use a strong step stool that has a grab bar. Keep electrical cords out of the way. Do not use floor polish or wax that makes floors slippery. If you must use wax, use non-skid floor wax. Do not have throw rugs and other things on the floor that can make you trip. What can I do with my stairs? Do not leave any items on the stairs. Make sure that there are handrails on both sides of the stairs and use them. Fix handrails that are broken or loose. Make sure that handrails are as long as the stairways. Check any carpeting to make sure that it is firmly attached to the stairs. Fix any carpet that is loose or worn. Avoid having throw rugs at the top or bottom of the stairs. If you do have throw rugs, attach them to the  floor with carpet tape. Make sure that you have a light switch at the top of the stairs and the bottom of the stairs. If you do not have them, ask someone to add them for you. What else can I do to help prevent falls? Wear shoes that: Do not have high heels. Have rubber bottoms. Are comfortable and fit you well. Are closed at the toe. Do not wear sandals. If you use a stepladder: Make sure that it is fully opened. Do not climb a closed stepladder. Make sure that both sides of the stepladder are locked into place. Ask someone to hold it for you, if possible. Clearly mark and make sure that you can see: Any grab bars or handrails. First and last steps. Where the edge of each step is. Use tools that help you move  around (mobility aids) if they are needed. These include: Canes. Walkers. Scooters. Crutches. Turn on the lights when you go into a dark area. Replace any light bulbs as soon as they burn out. Set up your furniture so you have a clear path. Avoid moving your furniture around. If any of your floors are uneven, fix them. If there are any pets around you, be aware of where they are. Review your medicines with your doctor. Some medicines can make you feel dizzy. This can increase your chance of falling. Ask your doctor what other things that you can do to help prevent falls. This information is not intended to replace advice given to you by your health care provider. Make sure you discuss any questions you have with your health care provider. Document Released: 06/14/2009 Document Revised: 01/24/2016 Document Reviewed: 09/22/2014 Elsevier Interactive Patient Education  2017 [employer].

## 2022-02-17 NOTE — Progress Notes (Signed)
Subjective:   Steven Saunders. is a 71 y.o. male who presents for Medicare Annual/Subsequent preventive examination.  Review of Systems           Objective:    Today's Vitals   02/17/22 0838  BP: 140/78  Weight: 266 lb 6.4 oz (120.8 kg)  Height: 6' (1.829 m)   Body mass index is 36.13 kg/m.     06/06/2020   10:24 AM 05/04/2019    3:00 PM 04/27/2019    1:16 PM 03/16/2018    9:31 AM 05/26/2017    7:28 AM 03/03/2017   10:36 AM 11/15/2016    9:22 PM  Advanced Directives  Does Patient Have a Medical Advance Directive? Yes Yes Yes Yes Yes Yes Yes  Type of Paramedic of [place];Living will [place];Living will [place];Living will [place];Living will [place];Living will [place];Living will [place]  Does patient want to make changes to medical advance directive?  No - Patient declined No - Patient declined      Copy of [place] in Chart? Yes - validated most recent copy scanned in chart (See row information) No - copy requested No - copy requested Yes Yes Yes No - copy requested    Current Medications (verified) Outpatient Encounter Medications as of 02/17/2022  Medication Sig   acetaminophen (TYLENOL) 500 MG tablet Take 500 mg by mouth daily as needed.    aspirin EC 81 MG tablet Take 81 mg by mouth daily. Swallow whole.   carvedilol (COREG) 25 MG tablet TAKE 1 TABLET BY MOUTH TWICE DAILY   Cholecalciferol (VITAMIN D-3) 25 MCG (1000 UT) CAPS Take 1 capsule by mouth daily.   digoxin (LANOXIN) 0.125 MG tablet TAKE ONE TABLET BY MOUTH DAILY   docusate sodium (COLACE) 100 MG capsule Take 100 mg by mouth daily.   glucosamine-chondroitin 500-400 MG tablet Take 1 tablet by mouth daily.   irbesartan (AVAPRO) 150 MG tablet TAKE 1 TABLET BY MOUTH ONCE DAILY. *STOPTAKING 300/12.5   loratadine (CLARITIN) 10 MG tablet Take 10 mg  by mouth daily.   lovastatin (MEVACOR) 20 MG tablet TAKE 1 TABLET BY MOUTH ONCE EVERY EVENING   Multiple Vitamin (MULTIVITAMIN) tablet Take 1 tablet by mouth daily.   spironolactone (ALDACTONE) 25 MG tablet TAKE 1 TABLET BY MOUTH ONCE DAILY   tadalafil (CIALIS) 20 MG tablet TAKE 1 TABLET BY MOUTH ONCE DAILY AS NEEDED ERECTILE DYSFUNCTION   tamsulosin (FLOMAX) 0.4 MG CAPS capsule TAKE 1 CAPSULE BY MOUTH ONCE DAILY   valACYclovir (VALTREX) 1000 MG tablet TAKE 2 TABLETS BY MOUTH EVERY 12 HOURS AS NEEDED AS DIRECTED   No facility-administered encounter medications on file as of 02/17/2022.    Allergies (verified) Patient has no known allergies.   History: Past Medical History:  Diagnosis Date   Permanent atrial fibrillation [facility])    Prostate cancer [facility])    Past Surgical History:  Procedure Laterality Date   APPENDECTOMY     CARDIAC CATHETERIZATION  2007   COLONOSCOPY WITH PROPOFOL N/A 05/26/2017   Procedure: COLONOSCOPY WITH PROPOFOL;  Surgeon: [name], MD;  Location: [facility] ENDOSCOPY;  Service: Endoscopy;  Laterality: N/A;   HERNIA REPAIR     1950's x 2   JOINT REPLACEMENT Left 2014   PROSTATE BIOPSY     thumb surgery  1980   had staph infection and had to cut out the area  TOTAL HIP ARTHROPLASTY Left 2014   Dr. Sabra Heck   TOTAL HIP ARTHROPLASTY Right 05/04/2019   Procedure: TOTAL HIP ARTHROPLASTY ANTERIOR APPROACH;  Surgeon: [name], MD;  Location: [facility] ORS;  Service: Orthopedics;  Laterality: Right;   Family History  Problem Relation Age of Onset   Hypertension Mother    Heart attack Father    Melanoma Father    Prostate cancer Neg Hx    Bladder Cancer Neg Hx    Kidney cancer Neg Hx    Social History   Socioeconomic History   Marital status: Married    Spouse name: Not on file   Number of children: 2   Years of education: Not on file   Highest education level: Some college, no degree  Occupational History   Occupation: Retired    Comment: Still drives  school bus  Tobacco Use   Smoking status: Former    Packs/day: 2.00    Years: 15.00    Total pack years: 30.00    Types: Cigarettes    Quit date: 11/12/2009    Years since quitting: 12.2   Smokeless tobacco: Never   Tobacco comments:    Pt states he quit around 2000?  Vaping Use   Vaping Use: Never used  Substance and Sexual Activity   Alcohol use: Yes    Alcohol/week: 4.0 - 8.0 standard drinks of alcohol    Types: 4 - 8 Cans of beer per week    Comment: 1-2 beers 4xs a week   Drug use: No   Sexual activity: Yes    Birth control/protection: None  Other Topics Concern   Not on file  Social History Narrative   Not on file   Social Determinants of Health   Financial Resource Strain: Low Risk  (06/06/2020)   Overall Financial Resource Strain (CARDIA)    Difficulty of Paying Living Expenses: Not hard at all  Food Insecurity: No Food Insecurity (06/06/2020)   Hunger Vital Sign    Worried About Running Out of Food in the Last Year: Never true    Ran Out of Food in the Last Year: Never true  Transportation Needs: No Transportation Needs (06/06/2020)   PRAPARE - Hydrologist (Medical): No    Lack of Transportation (Non-Medical): No  Physical Activity: Insufficiently Active (02/17/2022)   Exercise Vital Sign    Days of Exercise per Week: 3 days    Minutes of Exercise per Session: 30 min  Stress: No Stress Concern Present (02/17/2022)   [place]    Feeling of Stress : Not at all  Social Connections: Moderately Isolated (06/06/2020)   Social Connection and Isolation Panel [NHANES]    Frequency of Communication with Friends and Family: More than three times a week    Frequency of Social Gatherings with Friends and Family: More than three times a week    Attends Religious Services: Never    Marine scientist or Organizations: No    Attends Archivist Meetings: Never     Marital Status: Married    Tobacco Counseling Counseling given: Not Answered Tobacco comments: Pt states he quit around 2000?   Clinical Intake:  Pre-visit preparation completed: Yes  Pain : No/denies pain     Nutritional Risks: Other (Comment) Diabetes: No  How often do you need to have someone help you when you read instructions, pamphlets, or other written materials from your doctor or pharmacy?:  1 - Never  Diabetic?no  Interpreter Needed?: No  Information entered by :: Kirke Shaggy, LPN   Activities of Daily Living    02/13/2022   10:15 AM 03/27/2021    8:26 AM  In your present state of health, do you have any difficulty performing the following activities:  Hearing? 0 0  Vision? 0 0  Difficulty concentrating or making decisions? 0 0  Walking or climbing stairs? 0 0  Dressing or bathing? 0 0  Doing errands, shopping? 0 0  Preparing Food and eating ? N   Using the Toilet? N   In the past six months, have you accidently leaked urine? N   Do you have problems with loss of bowel control? N   Managing your Medications? N   Housekeeping or managing your Housekeeping? N     Patient Care Team: Birdie Sons, MD as PCP - General (Family Medicine) Evans Lance, MD as PCP - Cardiology (Cardiology) Evans Lance, MD as Consulting Physician (Cardiology) Earnestine Leys, MD (Orthopedic Surgery) [name], MD as Consulting Physician (Orthopedic Surgery) Billey Co, MD as Consulting Physician (Urology) Odette Fraction (Optometry) Birdie Sons, MD as Referring Physician (Family Medicine)  Indicate any recent Medical Services you may have received from other than [facility] providers in the past year (date may be approximate).     Assessment:   This is a routine wellness examination for [facility].  Hearing/Vision screen No results found.  Dietary issues and exercise activities discussed:     Goals Addressed             This Visit's  Progress    DIET - EAT MORE FRUITS AND VEGETABLES         Depression Screen    02/17/2022    8:47 AM 03/27/2021    8:26 AM 08/14/2020    9:46 AM 02/15/2020    8:39 AM 03/16/2018    9:40 AM 03/16/2018    9:31 AM 03/03/2017   10:43 AM  PHQ 2/9 Scores  PHQ - 2 Score 0 0 0 0 0 0 0  PHQ- 9 Score 0 0 0  0  0    Fall Risk    02/13/2022   10:15 AM 03/27/2021    8:26 AM 08/14/2020    9:46 AM 06/06/2020   10:24 AM 03/27/2020    2:23 PM  Fall Risk   Falls in the past year? 0 0 0 0 0  Comment     Emmi Telephone Survey: data to providers prior to load  Number falls in past yr: 0 0 0 0   Injury with Fall? 0 0 0 0   Risk for fall due to :  No Fall Risks No Fall Risks    Follow up  Falls evaluation completed Falls evaluation completed      FALL RISK PREVENTION PERTAINING TO THE HOME:  Any stairs in or around the home? No  If so, are there any without handrails? No  Home free of loose throw rugs in walkways, pet beds, electrical cords, etc? Yes  Adequate lighting in your home to reduce risk of falls? Yes   ASSISTIVE DEVICES UTILIZED TO PREVENT FALLS:  Life alert? No  Use of a cane, walker or w/c? No  Grab bars in the bathroom? Yes  Shower chair or bench in shower? Yes  Elevated toilet seat or a handicapped toilet? Yes   TIMED UP AND GO:  Was the test performed? Yes .  Length of time to ambulate 10 feet: 4 sec.   Gait steady and fast without use of assistive device  Cognitive Function:        06/06/2020   10:27 AM 03/03/2017   10:43 AM  6CIT Screen  What Year? 0 points 0 points  What month? 0 points 0 points  What time? 0 points 0 points  Count back from 20 0 points 0 points  Months in reverse 0 points 0 points  Repeat phrase 0 points 0 points  Total Score 0 points 0 points    Immunizations Immunization History  Administered Date(s) Administered   Influenza-Unspecified 04/14/2017, 04/01/2018   Moderna Sars-Covid-2 Vaccination 10/13/2019, 11/10/2019   Pneumococcal  Conjugate-13 04/14/2014   Pneumococcal Polysaccharide-23 01/10/2016   Tdap 04/14/2014    TDAP status: Up to date  Flu Vaccine status: Declined, Education has been provided regarding the importance of this vaccine but patient still declined. Advised may receive this vaccine at local pharmacy or Health Dept. Aware to provide a copy of the vaccination record if obtained from local pharmacy or Health Dept. Verbalized acceptance and understanding.  Pneumococcal vaccine status: Up to date  Covid-19 vaccine status: Completed vaccines  Qualifies for Shingles Vaccine? Yes   Zostavax completed No   Shingrix Completed?: No.    Education has been provided regarding the importance of this vaccine. Patient has been advised to call insurance company to determine out of pocket expense if they have not yet received this vaccine. Advised may also receive vaccine at local pharmacy or Health Dept. Verbalized acceptance and understanding.  Screening Tests Health Maintenance  Topic Date Due   Zoster Vaccines- Shingrix (1 of 2) Never done   COVID-19 Vaccine (3 - Moderna risk series) 12/08/2019   INFLUENZA VACCINE  04/01/2022   COLONOSCOPY (Pts 45-87yr Insurance coverage will need to be confirmed)  05/26/2022   TETANUS/TDAP  04/14/2024   Pneumonia Vaccine 71 Years old  Completed   Hepatitis C Screening  Completed   HPV VACCINES  Aged Out    Health Maintenance  Health Maintenance Due  Topic Date Due   Zoster Vaccines- Shingrix (1 of 2) Never done   COVID-19 Vaccine (3 - Moderna risk series) 12/08/2019    Colorectal cancer screening: Type of screening: Colonoscopy. Completed 05/26/17. Repeat every 5 years- will schedule for October  Lung Cancer Screening: (Low Dose CT Chest recommended if Age 73-80years, 30 pack-year currently smoking OR have quit w/in 15years.) does not qualify.    Additional Screening:  Hepatitis C Screening: does qualify; Completed 01/10/16  Vision Screening: Recommended  annual ophthalmology exams for early detection of glaucoma and other disorders of the eye. Is the patient up to date with their annual eye exam?  Yes  Who is the provider or what is the name of the office in which the patient attends annual eye exams? Dr.Thurmond If pt is not established with a provider, would they like to be referred to a provider to establish care? No .   Dental Screening: Recommended annual dental exams for proper oral hygiene  Community Resource Referral / Chronic Care Management: CRR required this visit?  No   CCM required this visit?  No      Plan:     I have personally reviewed and noted the following in the patient's chart:   Medical and social history Use of alcohol, tobacco or illicit drugs  Current medications and supplements including opioid prescriptions. Patient is not currently taking opioid prescriptions. Functional ability  and status Nutritional status Physical activity Advanced directives List of other physicians Hospitalizations, surgeries, and ER visits in previous 12 months Vitals Screenings to include cognitive, depression, and falls Referrals and appointments  In addition, I have reviewed and discussed with patient certain preventive protocols, quality metrics, and best practice recommendations. A written personalized care plan for preventive services as well as general preventive health recommendations were provided to patient.     Dionisio David, LPN   02/25/9484   Nurse Notes: none

## 2022-02-25 ENCOUNTER — Other Ambulatory Visit: Payer: Self-pay | Admitting: Urology

## 2022-02-25 DIAGNOSIS — N401 Enlarged prostate with lower urinary tract symptoms: Secondary | ICD-10-CM

## 2022-03-17 ENCOUNTER — Telehealth: Payer: Self-pay

## 2022-03-17 ENCOUNTER — Other Ambulatory Visit: Payer: Self-pay

## 2022-03-17 DIAGNOSIS — Z86010 Personal history of colonic polyps: Secondary | ICD-10-CM

## 2022-03-17 MED ORDER — NA SULFATE-K SULFATE-MG SULF 17.5-3.13-1.6 GM/177ML PO SOLN: 1.0000 | Freq: Once | ORAL | 0 refills | Status: AC

## 2022-03-17 NOTE — Telephone Encounter (Signed)
Gastroenterology Pre-Procedure Review  Request Date: 06/1722 Requesting Physician: Dr. Allen Norris  PATIENT REVIEW QUESTIONS: The patient responded to the following health history questions as indicated:    1. Are you having any GI issues? no 2. Do you have a personal history of Polyps? yes (2018 polyps Dr. Allen Norris) 3. Do you have a family history of Colon Cancer or Polyps? no 4. Diabetes Mellitus? no 5. Joint replacements in the past 12 months?no 6. Major health problems in the past 3 months?no 7. Any artificial heart valves, MVP, or defibrillator? Pt has AFIB Dr. Crissie Sickles [facility]  Clearance has been sent to Dr. Tanna Furry office    MEDICATIONS & ALLERGIES:    Patient reports the following regarding taking any anticoagulation/antiplatelet therapy:   Plavix, Coumadin, Eliquis, Xarelto, Lovenox, Pradaxa, Brilinta, or Effient? no Aspirin? yes (81 mg daily)  Patient confirms/reports the following medications:  Current Outpatient Medications  Medication Sig Dispense Refill   acetaminophen (TYLENOL) 500 MG tablet Take 500 mg by mouth daily as needed.      aspirin EC 81 MG tablet Take 81 mg by mouth daily. Swallow whole.     carvedilol (COREG) 25 MG tablet TAKE 1 TABLET BY MOUTH TWICE DAILY 180 tablet 4   Cholecalciferol (VITAMIN D-3) 25 MCG (1000 UT) CAPS Take 1 capsule by mouth daily.     digoxin (LANOXIN) 0.125 MG tablet TAKE ONE TABLET BY MOUTH DAILY 90 tablet 3   docusate sodium (COLACE) 100 MG capsule Take 100 mg by mouth daily.     glucosamine-chondroitin 500-400 MG tablet Take 1 tablet by mouth daily.     irbesartan (AVAPRO) 150 MG tablet TAKE 1 TABLET BY MOUTH ONCE DAILY. *STOPTAKING 300/12.5 90 tablet 0   loratadine (CLARITIN) 10 MG tablet Take 10 mg by mouth daily.     lovastatin (MEVACOR) 20 MG tablet TAKE 1 TABLET BY MOUTH ONCE EVERY EVENING 90 tablet 4   Multiple Vitamin (MULTIVITAMIN) tablet Take 1 tablet by mouth daily.     spironolactone (ALDACTONE) 25 MG tablet  TAKE 1 TABLET BY MOUTH ONCE DAILY 90 tablet 3   tadalafil (CIALIS) 20 MG tablet TAKE 1 TABLET BY MOUTH ONCE DAILY AS NEEDED ERECTILE DYSFUNCTION 15 tablet 6   tamsulosin (FLOMAX) 0.4 MG CAPS capsule TAKE 1 CAPSULE BY MOUTH ONCE DAILY 90 capsule 0   valACYclovir (VALTREX) 1000 MG tablet TAKE 2 TABLETS BY MOUTH EVERY 12 HOURS AS NEEDED AS DIRECTED 20 tablet 2   No current facility-administered medications for this visit.    Patient confirms/reports the following allergies:  No Known Allergies  No orders of the defined types were placed in this encounter.   AUTHORIZATION INFORMATION Primary Insurance: 1D#: Group #:  Secondary Insurance: 1D#: Group #:  SCHEDULE INFORMATION: Date: 06/17/22 Time: Location: [facility]

## 2022-03-19 ENCOUNTER — Telehealth: Payer: Self-pay | Admitting: *Deleted

## 2022-03-19 NOTE — Telephone Encounter (Signed)
   Pre-operative Risk Assessment    Patient Name: Steven Saunders.  DOB: [birth year] MRN: [id number]      Request for Surgical Clearance    Procedure:   COLONOSCOPY  Date of Surgery:  Clearance 06/17/22                                 Surgeon:   Surgeon's Group or Practice Name:  [facility] Phone number:  [phone] Fax number:  [phone]  ATTN:  Jasmine Pang [id number]  Type of Clearance Requested:   - Medical  - Pharmacy:  Hold Aspirin NOT INDICATED   Type of Anesthesia:  General    Additional requests/questions:    Astrid Divine   03/19/2022, 7:42 AM

## 2022-03-20 NOTE — Telephone Encounter (Signed)
Primary Cardiologist:Gregg Lovena Le, MD   Preoperative team, please contact this patient and set up a phone call appointment for further preoperative risk assessment. Please obtain consent and complete medication review. Thank you for your help.   I confirm that guidance regarding antiplatelet and oral anticoagulation therapy has been completed and, if necessary, noted below.   Emmaline Life, NP-C    03/20/2022, 1:30 PM [place] [address] Office [phone] Fax [phone]

## 2022-03-21 ENCOUNTER — Telehealth: Payer: Self-pay | Admitting: *Deleted

## 2022-03-21 NOTE — Telephone Encounter (Signed)
Patient was returning call. Please advise ?

## 2022-03-21 NOTE — Telephone Encounter (Signed)
Pt agreeable to plan of care for tele pre op appt 05/22/22 @ 10 am. Med rec and consent are done.     Patient Consent for Virtual Visit        Steven Saunders. has provided verbal consent on 03/21/2022 for a virtual visit (video or telephone).   CONSENT FOR VIRTUAL VISIT FOR:  Steven Saunders.  By participating in this virtual visit I agree to the following:  I hereby voluntarily request, consent and authorize [facility] and its employed or contracted physicians, physician assistants, nurse practitioners or other licensed health care professionals (the Practitioner), to provide me with telemedicine health care services (the "Services") as deemed necessary by the treating Practitioner. I acknowledge and consent to receive the Services by the Practitioner via telemedicine. I understand that the telemedicine visit will involve communicating with the Practitioner through live audiovisual communication technology and the disclosure of certain medical information by electronic transmission. I acknowledge that I have been given the opportunity to request an in-person assessment or other available alternative prior to the telemedicine visit and am voluntarily participating in the telemedicine visit.  I understand that I have the right to withhold or withdraw my consent to the use of telemedicine in the course of my care at any time, without affecting my right to future care or treatment, and that the Practitioner or I may terminate the telemedicine visit at any time. I understand that I have the right to inspect all information obtained and/or recorded in the course of the telemedicine visit and may receive copies of available information for a reasonable fee.  I understand that some of the potential risks of receiving the Services via telemedicine include:  Delay or interruption in medical evaluation due to technological equipment failure or disruption; Information transmitted may not be  sufficient (e.g. poor resolution of images) to allow for appropriate medical decision making by the Practitioner; and/or  In rare instances, security protocols could fail, causing a breach of personal health information.  Furthermore, I acknowledge that it is my responsibility to provide information about my medical history, conditions and care that is complete and accurate to the best of my ability. I acknowledge that Practitioner's advice, recommendations, and/or decision may be based on factors not within their control, such as incomplete or inaccurate data provided by me or distortions of diagnostic images or specimens that may result from electronic transmissions. I understand that the practice of medicine is not an exact science and that Practitioner makes no warranties or guarantees regarding treatment outcomes. I acknowledge that a copy of this consent can be made available to me via my patient portal ([place]), or I can request a printed copy by calling the office of [facility].    I understand that my insurance will be billed for this visit.   I have read or had this consent read to me. I understand the contents of this consent, which adequately explains the benefits and risks of the Services being provided via telemedicine.  I have been provided ample opportunity to ask questions regarding this consent and the Services and have had my questions answered to my satisfaction. I give my informed consent for the services to be provided through the use of telemedicine in my medical care

## 2022-03-21 NOTE — Telephone Encounter (Signed)
Pt agreeable to plan of care for tele pre op appt 05/22/22 @ 10 am. Med rec and consent are done.

## 2022-03-21 NOTE — Telephone Encounter (Signed)
Left message to call back for tele pre op appt 

## 2022-05-05 ENCOUNTER — Other Ambulatory Visit: Payer: Self-pay | Admitting: Family Medicine

## 2022-05-05 DIAGNOSIS — I10 Essential (primary) hypertension: Secondary | ICD-10-CM

## 2022-05-21 ENCOUNTER — Ambulatory Visit (INDEPENDENT_AMBULATORY_CARE_PROVIDER_SITE_OTHER): Payer: PPO | Admitting: Family Medicine

## 2022-05-21 VITALS — BP 146/79 | HR 52 | Temp 97.7°F | Resp 18 | Wt 263.0 lb

## 2022-05-21 DIAGNOSIS — R739 Hyperglycemia, unspecified: Secondary | ICD-10-CM

## 2022-05-21 DIAGNOSIS — I4821 Permanent atrial fibrillation: Secondary | ICD-10-CM

## 2022-05-21 DIAGNOSIS — R200 Anesthesia of skin: Secondary | ICD-10-CM

## 2022-05-21 DIAGNOSIS — Z0000 Encounter for general adult medical examination without abnormal findings: Secondary | ICD-10-CM

## 2022-05-21 DIAGNOSIS — E785 Hyperlipidemia, unspecified: Secondary | ICD-10-CM

## 2022-05-21 DIAGNOSIS — M6208 Separation of muscle (nontraumatic), other site: Secondary | ICD-10-CM

## 2022-05-21 DIAGNOSIS — R0609 Other forms of dyspnea: Secondary | ICD-10-CM | POA: Diagnosis not present

## 2022-05-21 DIAGNOSIS — I10 Essential (primary) hypertension: Secondary | ICD-10-CM

## 2022-05-21 DIAGNOSIS — R202 Paresthesia of skin: Secondary | ICD-10-CM

## 2022-05-21 NOTE — Patient Instructions (Addendum)
Please review the attached list of medications and notify my office if there are any errors.   I strongly recommend getting the updated Covid vaccine at your local pharmacy. The Covid vaccines from 2021 do not offer any protection against the current strain of Covid, and most people currently hospitalized and dying from Covid have underlying heart or lung disease.

## 2022-05-21 NOTE — Progress Notes (Signed)
Steven Saunders,acting as a scribe for Lelon Huh, MD.,have documented all relevant documentation on the behalf of Lelon Huh, MD,as directed by  Lelon Huh, MD while in the presence of Lelon Huh, MD.    Complete physical exam   Patient: Steven Saunders.   DOB: [birth year]   71 y.o. Male  MRN: [id number] Visit Date: 05/21/2022  Today's healthcare provider: Lelon Huh, MD   Chief Complaint  Patient presents with   Annual Exam   Hyperlipidemia   Hypertension   Subjective    Steven Saunders. is a 71 y.o. male who presents today for a complete physical exam after having his Medicare wellness exam with the nurse health advisor.  He does report feeling a little more short of breath than usual the last few weeks, mainly when exerting himself. Has not had any cough, fevers, swelling, chest pains, or palpitations. He continues annual follow up with Dr. Lovena Le for a-fib who has recommended Eliquis but patient still does not want to take it. He is taking all other medications consistently and tolerating well.   Past Medical History:  Diagnosis Date   Permanent atrial fibrillation [facility])    Prostate cancer [facility])    Past Surgical History:  Procedure Laterality Date   APPENDECTOMY     CARDIAC CATHETERIZATION  2007   COLONOSCOPY WITH PROPOFOL N/A 05/26/2017   Procedure: COLONOSCOPY WITH PROPOFOL;  Surgeon: [name], MD;  Location: [facility] ENDOSCOPY;  Service: Endoscopy;  Laterality: N/A;   HERNIA REPAIR     1950's x 2   JOINT REPLACEMENT Left 2014   PROSTATE BIOPSY     thumb surgery  1980   had staph infection and had to cut out the area   [place] Left 2014   Dr. Sabra Heck   TOTAL HIP ARTHROPLASTY Right 05/04/2019   Procedure: [place];  Surgeon: [name], MD;  Location: [facility] ORS;  Service: Orthopedics;  Laterality: Right;   Social History   Socioeconomic History   Marital status: Married    Spouse name: Not on  file   Number of children: 2   Years of education: Not on file   Highest education level: Some college, no degree  Occupational History   Occupation: Retired    Comment: Still drives school bus  Tobacco Use   Smoking status: Former    Packs/day: 2.00    Years: 15.00    Total pack years: 30.00    Types: Cigarettes    Quit date: 11/12/2009    Years since quitting: 12.5   Smokeless tobacco: Never   Tobacco comments:    Pt states he quit around 2000?  Vaping Use   Vaping Use: Never used  Substance and Sexual Activity   Alcohol use: Yes    Alcohol/week: 4.0 - 8.0 standard drinks of alcohol    Types: 4 - 8 Cans of beer per week    Comment: 1-2 beers 4xs a week   Drug use: No   Sexual activity: Yes    Birth control/protection: None  Other Topics Concern   Not on file  Social History Narrative   Not on file   Social Determinants of Health   Financial Resource Strain: Low Risk  (02/17/2022)   Overall Financial Resource Strain (CARDIA)    Difficulty of Paying Living Expenses: Not hard at all  Food Insecurity: No Food Insecurity (02/17/2022)   Hunger Vital Sign    Worried About Running Out of  Food in the Last Year: Never true    [place] in the Last Year: Never true  Transportation Needs: No Transportation Needs (02/17/2022)   PRAPARE - Hydrologist (Medical): No    Lack of Transportation (Non-Medical): No  Physical Activity: Insufficiently Active (02/17/2022)   Exercise Vital Sign    Days of Exercise per Week: 3 days    Minutes of Exercise per Session: 30 min  Stress: No Stress Concern Present (02/17/2022)   [place]    Feeling of Stress : Not at all  Social Connections: Socially Isolated (02/17/2022)   Social Connection and Isolation Panel [NHANES]    Frequency of Communication with Friends and Family: Once a week    Frequency of Social Gatherings with Friends and Family:  Once a week    Attends Religious Services: Never    Marine scientist or Organizations: No    Attends Archivist Meetings: Never    Marital Status: Married  Human resources officer Violence: Not At Risk (02/17/2022)   Humiliation, Afraid, Rape, and Kick questionnaire    Fear of Current or Ex-Partner: No    Emotionally Abused: No    Physically Abused: No    Sexually Abused: No   Family Status  Relation Name Status   Mother  Deceased       Cause of death: Complications from a fall; broken hip   Father  Deceased at age 64       melanoma   Sister  Alive   MGM  Deceased   MGF  Deceased   [place]  Deceased   PGF  Deceased   Neg Hx  (Not Specified)   Family History  Problem Relation Age of Onset   Hypertension Mother    Heart attack Father    Melanoma Father    Prostate cancer Neg Hx    Bladder Cancer Neg Hx    Kidney cancer Neg Hx    No Known Allergies  Patient Care Team: Birdie Sons, MD as PCP - General (Family Medicine) Evans Lance, MD as PCP - Cardiology (Cardiology) Evans Lance, MD as Consulting Physician (Cardiology) Earnestine Leys, MD (Orthopedic Surgery) [name], MD as Consulting Physician (Orthopedic Surgery) Billey Co, MD as Consulting Physician (Urology) Odette Fraction (Optometry) Birdie Sons, MD as Referring Physician (Family Medicine)   Medications: Outpatient Medications Prior to Visit  Medication Sig   acetaminophen (TYLENOL) 500 MG tablet Take 500 mg by mouth daily as needed.    aspirin EC 81 MG tablet Take 81 mg by mouth daily. Swallow whole.   carvedilol (COREG) 25 MG tablet TAKE 1 TABLET BY MOUTH TWICE DAILY   Cholecalciferol (VITAMIN D-3) 25 MCG (1000 UT) CAPS Take 1 capsule by mouth daily.   digoxin (LANOXIN) 0.125 MG tablet TAKE ONE TABLET BY MOUTH DAILY   docusate sodium (COLACE) 100 MG capsule Take 100 mg by mouth daily.   glucosamine-chondroitin 500-400 MG tablet Take 1 tablet by mouth daily.   irbesartan  (AVAPRO) 150 MG tablet TAKE 1 TABLET BY MOUTH ONCE DAILY. *STOPTAKING 300/12.5   loratadine (CLARITIN) 10 MG tablet Take 10 mg by mouth daily.   lovastatin (MEVACOR) 20 MG tablet TAKE 1 TABLET BY MOUTH ONCE EVERY EVENING   Multiple Vitamin (MULTIVITAMIN) tablet Take 1 tablet by mouth daily.   spironolactone (ALDACTONE) 25 MG tablet TAKE 1 TABLET BY MOUTH ONCE DAILY  tadalafil (CIALIS) 20 MG tablet TAKE 1 TABLET BY MOUTH ONCE DAILY AS NEEDED ERECTILE DYSFUNCTION   tamsulosin (FLOMAX) 0.4 MG CAPS capsule TAKE 1 CAPSULE BY MOUTH ONCE DAILY   valACYclovir (VALTREX) 1000 MG tablet TAKE 2 TABLETS BY MOUTH EVERY 12 HOURS AS NEEDED AS DIRECTED   No facility-administered medications prior to visit.    Review of Systems  Constitutional: Negative.   HENT: Negative.    Eyes: Negative.   Respiratory:  Positive for shortness of breath.   Cardiovascular: Negative.   Gastrointestinal: Negative.   Endocrine: Negative.   Genitourinary: Negative.   Musculoskeletal:  Positive for arthralgias and back pain.  Skin: Negative.   Allergic/Immunologic: Negative.   Neurological:  Positive for numbness (both feet).  Hematological: Negative.   Psychiatric/Behavioral: Negative.        Objective     BP (!) 146/79 (BP Location: Left Arm, Patient Position: Sitting, Cuff Size: Large)   Pulse (!) 52   Temp 97.7 F (36.5 C) (Oral)   Resp 18   Wt 263 lb (119.3 kg)   BMI 35.67 kg/m     Physical Exam   General Appearance:    Obese male. Alert, cooperative, in no acute distress, appears stated age  Head:    Normocephalic, without obvious abnormality, atraumatic  Eyes:    PERRL, conjunctiva/corneas clear, EOM's intact, fundi    benign, both eyes       Ears:    Normal TM's and external ear canals, both ears  Nose:   Nares normal, septum midline, mucosa normal, no drainage   or sinus tenderness  Throat:   Lips, mucosa, and tongue normal; teeth and gums normal  Neck:   Supple, symmetrical, trachea  midline, no adenopathy;       thyroid:  No enlargement/tenderness/nodules; no carotid   bruit or JVD  Back:     Symmetric, no curvature, ROM normal, no CVA tenderness  Lungs:     Clear to auscultation bilaterally, respirations unlabored  Chest wall:    No tenderness or deformity  Heart:    Bradycardic. Irregularly irregular rhythm. No murmurs, rubs, or gallops.  S1 and S2 normal  Abdomen:     Soft, non-tender, bowel sounds active all four quadrants,    no masses, no organomegaly. Diastasis recti noted.   Genitalia:    deferred  Rectal:    deferred  Extremities:   All extremities are intact. No cyanosis or edema  Pulses:   2+ and symmetric all extremities  Skin:   Skin color, texture, turgor normal, no rashes or lesions  Lymph nodes:   Cervical, supraclavicular, and axillary nodes normal  Neurologic:   CNII-XII intact. Normal strength, sensation and reflexes      throughout     Last depression screening scores    02/17/2022    8:47 AM 03/27/2021    8:26 AM 08/14/2020    9:46 AM  PHQ 2/9 Scores  PHQ - 2 Score 0 0 0  PHQ- 9 Score 0 0 0   Last fall risk screening    02/17/2022    8:50 AM  Fall Risk   Falls in the past year? 0  Number falls in past yr: 0  Injury with Fall? 0  Risk for fall due to : No Fall Risks  Follow up Falls evaluation completed   Last Audit-C alcohol use screening    02/17/2022    8:46 AM  Alcohol Use Disorder Test (AUDIT)  1. How often do you have  a drink containing alcohol? 2  2. How many drinks containing alcohol do you have on a typical day when you are drinking? 0  3. How often do you have six or more drinks on one occasion? 0  AUDIT-C Score 2   A score of 3 or more in women, and 4 or more in men indicates increased risk for alcohol abuse, EXCEPT if all of the points are from question 1   No results found for any visits on 05/21/22.  Assessment & Plan    Routine Health Maintenance and Physical Exam  Exercise Activities and Dietary  recommendations  Goals      DIET - EAT MORE FRUITS AND VEGETABLES     Reduce Sugar Intake     Recommend decreasing sugar intake in daily diet. Pt to eat 1 serving of ice cream every other day, rather than daily.         Immunization History  Administered Date(s) Administered   Influenza-Unspecified 04/14/2017, 04/01/2018   Moderna Sars-Covid-2 Vaccination 10/13/2019, 11/10/2019   Pneumococcal Conjugate-13 04/14/2014   Pneumococcal Polysaccharide-23 01/10/2016   Tdap 04/14/2014    Health Maintenance  Topic Date Due   Zoster Vaccines- Shingrix (1 of 2) Never done   COVID-19 Vaccine (3 - Moderna risk series) 12/08/2019   INFLUENZA VACCINE  04/01/2022   COLONOSCOPY (Pts 45-1yr Insurance coverage will need to be confirmed)  05/26/2022   TETANUS/TDAP  04/14/2024   Pneumonia Vaccine 71 Years old  Completed   Hepatitis C Screening  Completed   HPV VACCINES  Aged Out    Discussed health benefits of physical activity, and encouraged him to engage in regular exercise appropriate for his age and condition.  Has not had Covid vaccine since getting second dose in 2021 and has not had any known covid infections. Counseled regarding lack of protection against serious Covid infections since he has not had any boosters and lacks hybrid immunity.  2. Morbid obesity ([place]   3. Diastasis recti Reassurance  4. Essential hypertension Doing well current medications. SBP somewhat above goal, but usually better controlled.  - CBC - Comprehensive metabolic panel - TSH - Magnesium  5. Permanent atrial fibrillation (HCC) Rate fairly well controlled. Discussed prescription blood thinners, but he still prefers to just take E[place] Is to follow up with Dr. TLovena Leannually.   6. Hyperlipidemia, unspecified hyperlipidemia type He is tolerating lovastatin well with no adverse effects.   - Lipid panel - TSH  7. Hyperglycemia  - Hemoglobin A1c  8. Dyspnea on exertion  - Brain natriuretic  peptide  9. Numbness and tingling of both feet  - VITAMIN D 25 Hydroxy (Vit-D Deficiency, Fractures) - Vitamin B12     The entirety of the information documented in the History of Present Illness, Review of Systems and Physical Exam were personally obtained by me. Portions of this information were initially documented by the CMA and reviewed by me for thoroughness and accuracy.     DLelon Huh MD  [phone](phone) [phone](fax)  [place]

## 2022-05-22 ENCOUNTER — Ambulatory Visit: Payer: PPO | Attending: Cardiovascular Disease | Admitting: Physician Assistant

## 2022-05-22 DIAGNOSIS — Z01810 Encounter for preprocedural cardiovascular examination: Secondary | ICD-10-CM

## 2022-05-22 LAB — LIPID PANEL
Chol/HDL Ratio: 3 ratio (ref 0.0–5.0)
Cholesterol, Total: 131 mg/dL (ref 100–199)
HDL: 44 mg/dL (ref >39)
LDL Chol Calc (NIH): 65 mg/dL (ref 0–99)
Triglycerides: 124 mg/dL (ref 0–149)
VLDL Cholesterol Cal: 22 mg/dL (ref 5–40)

## 2022-05-22 LAB — COMPREHENSIVE METABOLIC PANEL
ALT: 20 IU/L (ref 0–44)
AST: 17 IU/L (ref 0–40)
Albumin/Globulin Ratio: 2 (ref 1.2–2.2)
Albumin: 4.6 g/dL (ref 3.8–4.8)
Alkaline Phosphatase: 60 IU/L (ref 44–121)
BUN/Creatinine Ratio: 16 (ref 10–24)
BUN: 14 mg/dL (ref 8–27)
Bilirubin Total: 1 mg/dL (ref 0.0–1.2)
CO2: 23 mmol/L (ref 20–29)
Calcium: 9.3 mg/dL (ref 8.6–10.2)
Chloride: 97 mmol/L (ref 96–106)
Creatinine, Ser: 0.88 mg/dL (ref 0.76–1.27)
Globulin, Total: 2.3 g/dL (ref 1.5–4.5)
Glucose: 133 mg/dL — ABNORMAL HIGH (ref 70–99)
Potassium: 5.4 mmol/L — ABNORMAL HIGH (ref 3.5–5.2)
Sodium: 134 mmol/L (ref 134–144)
Total Protein: 6.9 g/dL (ref 6.0–8.5)
eGFR: 91 mL/min/{1.73_m2} (ref >59)

## 2022-05-22 LAB — CBC
Hematocrit: 39.9 % (ref 37.5–51.0)
Hemoglobin: 14.3 g/dL (ref 13.0–17.7)
MCH: 34.2 pg — ABNORMAL HIGH (ref 26.6–33.0)
MCHC: 35.8 g/dL — ABNORMAL HIGH (ref 31.5–35.7)
MCV: 96 fL (ref 79–97)
Platelets: 140 10*3/uL — ABNORMAL LOW (ref 150–450)
RBC: 4.18 x10E6/uL (ref 4.14–5.80)
RDW: 12 % (ref 11.6–15.4)
WBC: 6.9 10*3/uL (ref 3.4–10.8)

## 2022-05-22 LAB — MAGNESIUM: Magnesium: 2 mg/dL (ref 1.6–2.3)

## 2022-05-22 LAB — HEMOGLOBIN A1C
Est. average glucose Bld gHb Est-mCnc: 126 mg/dL
Hgb A1c MFr Bld: 6 % — ABNORMAL HIGH (ref 4.8–5.6)

## 2022-05-22 LAB — BRAIN NATRIURETIC PEPTIDE: BNP: 136.4 pg/mL — ABNORMAL HIGH (ref 0.0–100.0)

## 2022-05-22 LAB — VITAMIN B12: Vitamin B-12: 760 pg/mL (ref 232–1245)

## 2022-05-22 LAB — TSH: TSH: 1.87 u[IU]/mL (ref 0.450–4.500)

## 2022-05-22 LAB — VITAMIN D 25 HYDROXY (VIT D DEFICIENCY, FRACTURES): Vit D, 25-Hydroxy: 43 ng/mL (ref 30.0–100.0)

## 2022-05-22 NOTE — Progress Notes (Signed)
Virtual Visit via Telephone Note   Because of Steven ROBAR Jr.'s co-morbid illnesses, he is at least at moderate risk for complications without adequate follow up.  This format is felt to be most appropriate for this patient at this time.  The patient did not have access to video technology/had technical difficulties with video requiring transitioning to audio format only (telephone).  All issues noted in this document were discussed and addressed.  No physical exam could be performed with this format.  Please refer to the patient's chart for his consent to telehealth for [facility].  Evaluation Performed:  Preoperative cardiovascular risk assessment _____________   Date:  05/22/2022   Patient ID:  Steven Pali., DOB [birth year], MRN [id number] Patient Location:  Home Provider location:   Office  Primary Care Provider:  Birdie Sons, MD Primary Cardiologist:  Steven Peru, MD  Chief Complaint / Patient Profile   71 y.o. y/o male with a h/o permanent atrial fibrillation and prostate cancer who is pending colonoscopy and presents today for telephonic preoperative cardiovascular risk assessment.  Past Medical History    Past Medical History:  Diagnosis Date   Permanent atrial fibrillation [facility])    Prostate cancer [facility])    Past Surgical History:  Procedure Laterality Date   APPENDECTOMY     CARDIAC CATHETERIZATION  2007   COLONOSCOPY WITH PROPOFOL N/A 05/26/2017   Procedure: COLONOSCOPY WITH PROPOFOL;  Surgeon: Steven Lame, MD;  Location: [facility] ENDOSCOPY;  Service: Endoscopy;  Laterality: N/A;   HERNIA REPAIR     1950's x 2   JOINT REPLACEMENT Left 2014   PROSTATE BIOPSY     thumb surgery  1980   had staph infection and had to cut out the area   [place] Left 2014   Dr. Sabra Saunders   TOTAL HIP ARTHROPLASTY Right 05/04/2019   Procedure: [place];  Surgeon: Steven Sheehan, MD;  Location: [facility] ORS;  Service:  Orthopedics;  Laterality: Right;    Allergies  No Known Allergies  History of Present Illness    Steven Maxon. is a 71 y.o. male who presents via audio/video conferencing for a telehealth visit today.  Pt was last seen in cardiology clinic on 10/03/21 by Dr. Lovena Le.  At that time Steven Dagher. was doing well. The patient is now pending procedure as outlined above. Since his last visit, he has been doing well but has some DOE. No symptoms with his atrial fibrillation. He drives a school bus part time.  He tells me that his wife does most of the indoor work and he stays out of the house.  He works in the yard.  He has no trouble with stairs or walking.  For this reason he scored a 4.86 METS on the DASI.  This exceeds the 4 METS minimum requirement.  We would prefer him not to hold his ASA for his upcoming procedure, but if bleeding risk is too high, then he can hold the ASA x 7 prior to the procedure.   Home Medications    Prior to Admission medications   Medication Sig Start Date End Date Taking? Authorizing Provider  acetaminophen (TYLENOL) 500 MG tablet Take 500 mg by mouth daily as needed.     [provider]  aspirin EC 81 MG tablet Take 81 mg by mouth daily. Swallow whole.    [provider]  carvedilol (COREG) 25 MG tablet TAKE 1 TABLET BY  MOUTH TWICE DAILY 08/29/21   Steven Sons, MD  Cholecalciferol (VITAMIN D-3) 25 MCG (1000 UT) CAPS Take 1 capsule by mouth daily.    [provider]  digoxin (LANOXIN) 0.125 MG tablet TAKE ONE TABLET BY MOUTH DAILY 05/05/22   Steven Sons, MD  docusate sodium (COLACE) 100 MG capsule Take 100 mg by mouth daily.    [provider]  glucosamine-chondroitin 500-400 MG tablet Take 1 tablet by mouth daily.    [provider]  irbesartan (AVAPRO) 150 MG tablet TAKE 1 TABLET BY MOUTH ONCE DAILY. *STOPTAKING 300/12.5 05/05/22   Steven Sons, MD  loratadine (CLARITIN) 10 MG tablet Take 10 mg by  mouth daily.    [provider]  lovastatin (MEVACOR) 20 MG tablet TAKE 1 TABLET BY MOUTH ONCE EVERY EVENING 05/05/22   Steven Sons, MD  Multiple Vitamin (MULTIVITAMIN) tablet Take 1 tablet by mouth daily.    [provider]  spironolactone (ALDACTONE) 25 MG tablet TAKE 1 TABLET BY MOUTH ONCE DAILY 01/01/22   Steven Sons, MD  tadalafil (CIALIS) 20 MG tablet TAKE 1 TABLET BY MOUTH ONCE DAILY AS NEEDED ERECTILE DYSFUNCTION 09/11/21   Billey Co, MD  tamsulosin (FLOMAX) 0.4 MG CAPS capsule TAKE 1 CAPSULE BY MOUTH ONCE DAILY 02/26/22   Ernestine Conrad, Larene [place] A, PA-C  valACYclovir (VALTREX) 1000 MG tablet TAKE 2 TABLETS BY MOUTH EVERY 12 HOURS AS NEEDED AS DIRECTED 08/08/21   Steven Sons, MD    Physical Exam    Vital Signs:  Steven Pali. does not have vital signs available for review today.  Given telephonic nature of communication, physical exam is limited. AAOx3. NAD. Normal affect.  Speech and respirations are unlabored.  Accessory Clinical Findings    None  Assessment & Plan    1.  Preoperative Cardiovascular Risk Assessment:  [name] perioperative risk of a major cardiac event is 0.4% according to the Revised Cardiac Risk Index (RCRI).  Therefore, he is at low risk for perioperative complications.   His functional capacity is good at 4.86 METs according to the [place] Activity Status Index (DASI). Recommendations: According to ACC/AHA guidelines, no further cardiovascular testing needed.  The patient may proceed to surgery at acceptable risk.   Antiplatelet and/or Anticoagulation Recommendations: We would prefer him to continue his aspirin perioperatively but if bleeding risk is deemed too high aspirin can be held for 7 days prior to his surgery.  Please resume Aspirin post operatively when it is felt to be safe from a bleeding standpoint.   A copy of this note will be routed to requesting surgeon.  Time:   Today, I have spent 10 minutes with the  patient with telehealth technology discussing medical history, symptoms, and management plan.     Steven Collard, PA-C  05/22/2022, 10:06 AM

## 2022-05-25 ENCOUNTER — Encounter: Payer: Self-pay | Admitting: Family Medicine

## 2022-05-25 DIAGNOSIS — R7303 Prediabetes: Secondary | ICD-10-CM | POA: Insufficient documentation

## 2022-05-26 ENCOUNTER — Encounter: Payer: Self-pay | Admitting: Family Medicine

## 2022-05-27 ENCOUNTER — Other Ambulatory Visit: Payer: Self-pay | Admitting: Urology

## 2022-05-27 ENCOUNTER — Other Ambulatory Visit: Payer: Self-pay | Admitting: Family Medicine

## 2022-05-27 DIAGNOSIS — N401 Enlarged prostate with lower urinary tract symptoms: Secondary | ICD-10-CM

## 2022-05-27 DIAGNOSIS — I10 Essential (primary) hypertension: Secondary | ICD-10-CM

## 2022-06-17 ENCOUNTER — Ambulatory Visit
Admission: RE | Admit: 2022-06-17 | Discharge: 2022-06-17 | Disposition: A | Discharge status: Home / Self Care | Payer: PPO | Attending: Gastroenterology | Admitting: Gastroenterology

## 2022-06-17 ENCOUNTER — Ambulatory Visit: Payer: PPO | Admitting: Anesthesiology

## 2022-06-17 ENCOUNTER — Encounter
Admission: RE | Disposition: A | Discharge status: Home / Self Care | Payer: Self-pay | Source: Home / Self Care | Attending: Gastroenterology

## 2022-06-17 ENCOUNTER — Encounter: Payer: Self-pay | Admitting: Gastroenterology

## 2022-06-17 DIAGNOSIS — Z6833 Body mass index (BMI) 33.0-33.9, adult: Secondary | ICD-10-CM | POA: Insufficient documentation

## 2022-06-17 DIAGNOSIS — D122 Benign neoplasm of ascending colon: Secondary | ICD-10-CM | POA: Insufficient documentation

## 2022-06-17 DIAGNOSIS — K640 First degree hemorrhoids: Secondary | ICD-10-CM | POA: Diagnosis not present

## 2022-06-17 DIAGNOSIS — Z86010 Personal history of colonic polyps: Secondary | ICD-10-CM | POA: Insufficient documentation

## 2022-06-17 DIAGNOSIS — E785 Hyperlipidemia, unspecified: Secondary | ICD-10-CM | POA: Diagnosis not present

## 2022-06-17 DIAGNOSIS — Z87891 Personal history of nicotine dependence: Secondary | ICD-10-CM | POA: Diagnosis not present

## 2022-06-17 DIAGNOSIS — D125 Benign neoplasm of sigmoid colon: Secondary | ICD-10-CM | POA: Diagnosis not present

## 2022-06-17 DIAGNOSIS — Z8546 Personal history of malignant neoplasm of prostate: Secondary | ICD-10-CM | POA: Diagnosis not present

## 2022-06-17 DIAGNOSIS — I4821 Permanent atrial fibrillation: Secondary | ICD-10-CM | POA: Diagnosis not present

## 2022-06-17 DIAGNOSIS — E669 Obesity, unspecified: Secondary | ICD-10-CM | POA: Insufficient documentation

## 2022-06-17 DIAGNOSIS — Z860101 Personal history of adenomatous and serrated colon polyps: Secondary | ICD-10-CM

## 2022-06-17 DIAGNOSIS — K635 Polyp of colon: Secondary | ICD-10-CM | POA: Diagnosis not present

## 2022-06-17 DIAGNOSIS — I10 Essential (primary) hypertension: Secondary | ICD-10-CM | POA: Diagnosis not present

## 2022-06-17 DIAGNOSIS — Z1211 Encounter for screening for malignant neoplasm of colon: Secondary | ICD-10-CM | POA: Diagnosis not present

## 2022-06-17 DIAGNOSIS — I4510 Unspecified right bundle-branch block: Secondary | ICD-10-CM | POA: Diagnosis not present

## 2022-06-17 DIAGNOSIS — D126 Benign neoplasm of colon, unspecified: Secondary | ICD-10-CM | POA: Diagnosis not present

## 2022-06-17 DIAGNOSIS — D123 Benign neoplasm of transverse colon: Secondary | ICD-10-CM | POA: Diagnosis not present

## 2022-06-17 HISTORY — DX: Cardiac arrhythmia, unspecified: I49.9

## 2022-06-17 HISTORY — PX: COLONOSCOPY WITH PROPOFOL: SHX5780

## 2022-06-17 SURGERY — COLONOSCOPY WITH PROPOFOL
Anesthesia: General

## 2022-06-17 MED ORDER — SODIUM CHLORIDE 0.9 % IV SOLN
INTRAVENOUS | Status: DC
  Administered 2022-06-17: 1000 mL via INTRAVENOUS

## 2022-06-17 MED ORDER — PROPOFOL 10 MG/ML IV BOLUS
INTRAVENOUS | Status: DC | PRN
  Administered 2022-06-17: 20 mg via INTRAVENOUS
  Administered 2022-06-17: 120 mg via INTRAVENOUS

## 2022-06-17 MED ORDER — PROPOFOL 500 MG/50ML IV EMUL
INTRAVENOUS | Status: DC | PRN
  Administered 2022-06-17: 150 ug/kg/min via INTRAVENOUS

## 2022-06-17 MED ORDER — LIDOCAINE HCL (CARDIAC) PF 100 MG/5ML IV SOSY
PREFILLED_SYRINGE | INTRAVENOUS | Status: DC | PRN
  Administered 2022-06-17: 100 mg via INTRAVENOUS

## 2022-06-17 MED ORDER — GLYCOPYRROLATE 0.2 MG/ML IJ SOLN
INTRAMUSCULAR | Status: DC | PRN
  Administered 2022-06-17: .1 mg via INTRAVENOUS

## 2022-06-17 NOTE — Transfer of Care (Signed)
Immediate Anesthesia Transfer of Care Note  Patient: Steven Saunders.  Procedure(s) Performed: COLONOSCOPY WITH PROPOFOL  Patient Location: Endoscopy Unit  Anesthesia Type:General  Level of Consciousness: drowsy  Airway & Oxygen Therapy: Patient Spontanous Breathing  Post-op Assessment: Report given to RN and Post -op Vital signs reviewed and stable  Post vital signs: Reviewed and stable  Last Vitals:  Vitals Value Taken Time  BP 104/66 06/17/22 0906  Temp    Pulse 61 06/17/22 0909  Resp 11 06/17/22 0909  SpO2 97 % 06/17/22 0909  Vitals shown include unvalidated device data.  Last Pain:  Vitals:   06/17/22 0751  TempSrc: Temporal  PainSc: 0-No pain         Complications: No notable events documented.

## 2022-06-17 NOTE — H&P (Signed)
[name], MD [address] [place], [place] [address] Phone:[phone] Fax : [phone]  Primary Care Physician:  Birdie Sons, MD Primary Gastroenterologist:  Dr. Allen Norris  Pre-Procedure History & Physical: HPI:  Steven Fell. is a 71 y.o. male is here for an colonoscopy.   Past Medical History:  Diagnosis Date   Dysrhythmia    Hypertension    Permanent atrial fibrillation [facility])    Prostate cancer [facility])     Past Surgical History:  Procedure Laterality Date   APPENDECTOMY     CARDIAC CATHETERIZATION  2007   COLONOSCOPY WITH PROPOFOL N/A 05/26/2017   Procedure: COLONOSCOPY WITH PROPOFOL;  Surgeon: [name], MD;  Location: [facility] ENDOSCOPY;  Service: Endoscopy;  Laterality: N/A;   HERNIA REPAIR     1950's x 2   JOINT REPLACEMENT Left 2014   PROSTATE BIOPSY     thumb surgery  1980   had staph infection and had to cut out the area   [place] Left 2014   Dr. Sabra Heck   TOTAL HIP ARTHROPLASTY Right 05/04/2019   Procedure: [place];  Surgeon: [name], MD;  Location: [facility] ORS;  Service: Orthopedics;  Laterality: Right;    Prior to Admission medications   Medication Sig Start Date End Date Taking? Authorizing Provider  aspirin EC 81 MG tablet Take 81 mg by mouth daily. Swallow whole.   Yes [provider]  carvedilol (COREG) 25 MG tablet TAKE 1 TABLET BY MOUTH TWICE DAILY 08/29/21  Yes Birdie Sons, MD  Cholecalciferol (VITAMIN D-3) 25 MCG (1000 UT) CAPS Take 1 capsule by mouth daily.   Yes [provider]  digoxin (LANOXIN) 0.125 MG tablet TAKE ONE TABLET BY MOUTH DAILY 05/05/22  Yes Birdie Sons, MD  docusate sodium (COLACE) 100 MG capsule Take 100 mg by mouth daily.   Yes [provider]  glucosamine-chondroitin 500-400 MG tablet Take 1 tablet by mouth daily.   Yes [provider]  irbesartan (AVAPRO) 150 MG tablet Take 1 tablet (150 mg total) by mouth  daily. 05/27/22  Yes Birdie Sons, MD  loratadine (CLARITIN) 10 MG tablet Take 10 mg by mouth daily.   Yes [provider]  lovastatin (MEVACOR) 20 MG tablet TAKE 1 TABLET BY MOUTH ONCE EVERY EVENING 05/05/22  Yes Birdie Sons, MD  Multiple Vitamin (MULTIVITAMIN) tablet Take 1 tablet by mouth daily.   Yes [provider]  spironolactone (ALDACTONE) 25 MG tablet TAKE 1 TABLET BY MOUTH ONCE DAILY 01/01/22  Yes Birdie Sons, MD  tamsulosin (FLOMAX) 0.4 MG CAPS capsule TAKE 1 CAPSULE BY MOUTH ONCE DAILY 05/27/22  Yes McGowan, Larene [place] A, PA-C  acetaminophen (TYLENOL) 500 MG tablet Take 500 mg by mouth daily as needed.     [provider]  tadalafil (CIALIS) 20 MG tablet TAKE 1 TABLET BY MOUTH ONCE DAILY AS NEEDED ERECTILE DYSFUNCTION 09/11/21   Billey Co, MD  valACYclovir (VALTREX) 1000 MG tablet TAKE 2 TABLETS BY MOUTH EVERY 12 HOURS AS NEEDED AS DIRECTED 08/08/21   Birdie Sons, MD    Allergies as of 03/17/2022   (No Known Allergies)    Family History  Problem Relation Age of Onset   Hypertension Mother    Heart attack Father    Melanoma Father    Prostate cancer Neg Hx    Bladder Cancer Neg Hx    Kidney cancer Neg Hx  Social History   Socioeconomic History   Marital status: Married    Spouse name: Not on file   Number of children: 2   Years of education: Not on file   Highest education level: Some college, no degree  Occupational History   Occupation: Retired    Comment: Still drives school bus  Tobacco Use   Smoking status: Former    Packs/day: 2.00    Years: 15.00    Total pack years: 30.00    Types: Cigarettes    Quit date: 11/12/2009    Years since quitting: 12.6   Smokeless tobacco: Never   Tobacco comments:    Pt states he quit around 2000?  Vaping Use   Vaping Use: Never used  Substance and Sexual Activity   Alcohol use: Yes    Alcohol/week: 4.0 - 8.0 standard drinks of alcohol    Types: 4 - 8 Cans of beer per week     Comment: 1-2 beers 4xs a week   Drug use: No   Sexual activity: Yes    Birth control/protection: None  Other Topics Concern   Not on file  Social History Narrative   Not on file   Social Determinants of Health   Financial Resource Strain: Low Risk  (02/17/2022)   Overall Financial Resource Strain (CARDIA)    Difficulty of Paying Living Expenses: Not hard at all  Food Insecurity: No Food Insecurity (02/17/2022)   Hunger Vital Sign    Worried About Running Out of Food in the Last Year: Never true    Ran Out of Food in the Last Year: Never true  Transportation Needs: No Transportation Needs (02/17/2022)   PRAPARE - Hydrologist (Medical): No    Lack of Transportation (Non-Medical): No  Physical Activity: Insufficiently Active (02/17/2022)   Exercise Vital Sign    Days of Exercise per Week: 3 days    Minutes of Exercise per Session: 30 min  Stress: No Stress Concern Present (02/17/2022)   [place]    Feeling of Stress : Not at all  Social Connections: Socially Isolated (02/17/2022)   Social Connection and Isolation Panel [NHANES]    Frequency of Communication with Friends and Family: Once a week    Frequency of Social Gatherings with Friends and Family: Once a week    Attends Religious Services: Never    Marine scientist or Organizations: No    Attends Archivist Meetings: Never    Marital Status: Married  Human resources officer Violence: Not At Risk (02/17/2022)   Humiliation, Afraid, Rape, and Kick questionnaire    Fear of Current or Ex-Partner: No    Emotionally Abused: No    Physically Abused: No    Sexually Abused: No    Review of Systems: See HPI, otherwise negative ROS  Physical Exam: BP (!) 145/79   Pulse (!) 50   Temp (!) 96.4 F (35.8 C) (Temporal)   Resp 18   Ht 6' (1.829 m)   Wt 113.6 kg   SpO2 99%   BMI 33.98 kg/m  General:   Alert,  pleasant and  cooperative in NAD Head:  Normocephalic and atraumatic. Neck:  Supple; no masses or thyromegaly. Lungs:  Clear throughout to auscultation.    Heart:  Regular rate and rhythm. Abdomen:  Soft, nontender and nondistended. Normal bowel sounds, without guarding, and without rebound.   Neurologic:  Alert and  oriented x4;  grossly  normal neurologically.  Impression/Plan: Steven Pali. is here for an colonoscopy to be performed for a history of adenomatous polyps on 2018   Risks, benefits, limitations, and alternatives regarding  colonoscopy have been reviewed with the patient.  Questions have been answered.  All parties agreeable.   [name], MD  06/17/2022, 8:05 AM

## 2022-06-17 NOTE — Op Note (Signed)
[facility] Gastroenterology Patient Name: Steven Saunders Procedure Date: 06/17/2022 8:42 AM MRN: [id number] Account #: [id number] Date of Birth: 1951/07/07 Admit Type: Outpatient Age: 71 Room: [facility] ENDO ROOM 4 Gender: Male Note Status: Finalized Instrument Name: Jasper Riling [id number] Procedure:             Colonoscopy Indications:           High risk colon cancer surveillance: Personal history                         of colonic polyps Providers:             [name] MD, MD Referring MD:          Kirstie Peri. Caryn Section, MD (Referring MD) Medicines:             Propofol per Anesthesia Complications:         No immediate complications. Procedure:             Pre-Anesthesia Assessment:                        - Prior to the procedure, a History and Physical was                         performed, and patient medications and allergies were                         reviewed. The patient's tolerance of previous                         anesthesia was also reviewed. The risks and benefits                         of the procedure and the sedation options and risks                         were discussed with the patient. All questions were                         answered, and informed consent was obtained. Prior                         Anticoagulants: The patient has taken no previous                         anticoagulant or antiplatelet agents. ASA Grade                         Assessment: II - A patient with mild systemic disease.                         After reviewing the risks and benefits, the patient                         was deemed in satisfactory condition to undergo the                         procedure.  After obtaining informed consent, the colonoscope was                         passed under direct vision. Throughout the procedure,                         the patient's blood pressure, pulse, and oxygen                         saturations were  monitored continuously. The                         Colonoscope was introduced through the anus and                         advanced to the the cecum, identified by appendiceal                         orifice and ileocecal valve. The colonoscopy was                         performed without difficulty. The patient tolerated                         the procedure well. The quality of the bowel                         preparation was excellent. Findings:      The perianal and digital rectal examinations were normal.      A 3 mm polyp was found in the ascending colon. The polyp was sessile.       The polyp was removed with a cold snare. Resection and retrieval were       complete.      A 3 mm polyp was found in the transverse colon. The polyp was sessile.       The polyp was removed with a cold snare. Resection and retrieval were       complete.      A 9 mm polyp was found in the sigmoid colon. The polyp was pedunculated.       The polyp was removed with a hot snare. Resection and retrieval were       complete.      Non-bleeding internal hemorrhoids were found during retroflexion. The       hemorrhoids were Grade I (internal hemorrhoids that do not prolapse). Impression:            - One 3 mm polyp in the ascending colon, removed with                         a cold snare. Resected and retrieved.                        - One 3 mm polyp in the transverse colon, removed with                         a cold snare. Resected and retrieved.                        - One 9  mm polyp in the sigmoid colon, removed with a                         hot snare. Resected and retrieved.                        - Non-bleeding internal hemorrhoids. Recommendation:        - Discharge patient to home.                        - Resume previous diet.                        - Continue present medications.                        - Await pathology results.                        - Repeat colonoscopy in 5 years for  surveillance. Procedure Code(s):     --- Professional ---                        [phone], Colonoscopy, flexible; with removal of                         tumor(s), polyp(s), or other lesion(s) by snare                         technique Diagnosis Code(s):     --- Professional ---                        Z86.010, Personal history of colonic polyps                        K63.5, Polyp of colon CPT copyright 2019 American Medical Association. All rights reserved. The codes documented in this report are preliminary and upon coder review may  be revised to meet current compliance requirements. [name] MD, MD 06/17/2022 9:03:39 AM This report has been signed electronically. Number of Addenda: 0 Note Initiated On: 06/17/2022 8:42 AM Scope Withdrawal Time: 0 hours 11 minutes 44 seconds  Total Procedure Duration: 0 hours 14 minutes 56 seconds  Estimated Blood Loss:  Estimated blood loss: none.      [facility]

## 2022-06-17 NOTE — Anesthesia Preprocedure Evaluation (Addendum)
Anesthesia Evaluation  Patient identified by MRN, date of birth, ID band Patient awake    Reviewed: Allergy & Precautions, NPO status , Patient's Chart, lab work & pertinent test results  History of Anesthesia Complications Negative for: history of anesthetic complications  Airway Mallampati: III   Neck ROM: Full    Dental  (+) Missing   Pulmonary former smoker (quit 2011),    Pulmonary exam normal breath sounds clear to auscultation       Cardiovascular hypertension, + dysrhythmias (a fib)  Rhythm:Irregular Rate:Normal  ECG 10/03/21: A fib, RBBB   Neuro/Psych negative neurological ROS     GI/Hepatic negative GI ROS,   Endo/Other  Obesity   Renal/GU negative Renal ROS   Prostate CA    Musculoskeletal   Abdominal   Peds  Hematology negative hematology ROS (+)   Anesthesia Other Findings Cardiology note 05/22/22:  1.  Preoperative Cardiovascular Risk Assessment:  Mr. Mandich perioperative risk of a major cardiac event is 0.4% according to the Revised Cardiac Risk Index (RCRI).  Therefore, he is at low risk for perioperative complications.   His functional capacity is good at 4.86 METs according to the Duke Activity Status Index (DASI). Recommendations: According to ACC/AHA guidelines, no further cardiovascular testing needed.  The patient may proceed to surgery at acceptable risk.   Antiplatelet and/or Anticoagulation Recommendations: We would prefer him to continue his aspirin perioperatively but if bleeding risk is deemed too high aspirin can be held for 7 days prior to his surgery.  Please resume Aspirin post operatively when it is felt to be safe from a bleeding standpoint.   Cardiology note 10/03/21:  1. Atrial fib - his VR is well controlled. He is asymptomatic. He still refuses to take an U[place]am or warfarin. 2. HTN -his bp is controlled. 3. Obesity - we discussed the improtance of weight loss. I discussed  fasting again. 4. Dyslipidemia - he will continue his statin.  Reproductive/Obstetrics                            Anesthesia Physical Anesthesia Plan  ASA: 3  Anesthesia Plan: General   Post-op Pain Management:    Induction: Intravenous  PONV Risk Score and Plan: 2 and Propofol infusion, TIVA and Treatment may vary due to age or medical condition  Airway Management Planned: Natural Airway  Additional Equipment:   Intra-op Plan:   Post-operative Plan:   Informed Consent: I have reviewed the patients History and Physical, chart, labs and discussed the procedure including the risks, benefits and alternatives for the proposed anesthesia with the patient or authorized representative who has indicated his/her understanding and acceptance.       Plan Discussed with: CRNA  Anesthesia Plan Comments: (LMA/GETA backup discussed.  Patient consented for risks of anesthesia including but not limited to:  - adverse reactions to medications - damage to eyes, teeth, lips or other oral mucosa - nerve damage due to positioning  - sore throat or hoarseness - damage to heart, brain, nerves, lungs, other parts of body or loss of life  Informed patient about role of CRNA in peri- and intra-operative care.  Patient voiced understanding.)        Anesthesia Quick Evaluation

## 2022-06-17 NOTE — Anesthesia Postprocedure Evaluation (Signed)
Anesthesia Post Note  Patient: Steven Saunders.  Procedure(s) Performed: COLONOSCOPY WITH PROPOFOL  Patient location during evaluation: PACU Anesthesia Type: General Level of consciousness: awake and alert, oriented and patient cooperative Pain management: pain level controlled Vital Signs Assessment: post-procedure vital signs reviewed and stable Respiratory status: spontaneous breathing, nonlabored ventilation and respiratory function stable Cardiovascular status: blood pressure returned to baseline and stable Postop Assessment: adequate PO intake Anesthetic complications: no   No notable events documented.   Last Vitals:  Vitals:   06/17/22 0917 06/17/22 0927  BP: 112/78 110/72  Pulse: 67 (!) 58  Resp: 17 12  Temp:    SpO2: 98% 98%    Last Pain:  Vitals:   06/17/22 0927  TempSrc:   PainSc: 0-No pain                 Darrin Nipper

## 2022-06-18 ENCOUNTER — Encounter: Payer: Self-pay | Admitting: Gastroenterology

## 2022-06-18 LAB — SURGICAL PATHOLOGY

## 2022-06-19 ENCOUNTER — Encounter: Payer: Self-pay | Admitting: Gastroenterology

## 2022-07-11 ENCOUNTER — Other Ambulatory Visit: Payer: Self-pay

## 2022-07-11 DIAGNOSIS — N529 Male erectile dysfunction, unspecified: Secondary | ICD-10-CM

## 2022-07-11 MED ORDER — SILDENAFIL CITRATE 100 MG PO TABS: ORAL_TABLET | ORAL | 11 refills | Status: DC

## 2022-07-11 NOTE — Telephone Encounter (Signed)
See my chart encounter.

## 2022-07-15 ENCOUNTER — Encounter: Payer: Self-pay | Admitting: Family Medicine

## 2022-07-15 ENCOUNTER — Ambulatory Visit (INDEPENDENT_AMBULATORY_CARE_PROVIDER_SITE_OTHER): Payer: PPO | Admitting: Family Medicine

## 2022-07-15 VITALS — BP 135/69 | HR 60 | Temp 98.2°F | Resp 16 | Wt 262.0 lb

## 2022-07-15 DIAGNOSIS — J069 Acute upper respiratory infection, unspecified: Secondary | ICD-10-CM

## 2022-07-15 MED ORDER — BENZONATATE 200 MG PO CAPS: 200.0000 mg | ORAL_CAPSULE | Freq: Two times a day (BID) | ORAL | 0 refills | Status: DC | PRN

## 2022-07-15 NOTE — Progress Notes (Signed)
   SUBJECTIVE:   CHIEF COMPLAINT / HPI:   UPPER RESPIRATORY TRACT INFECTION - symptom onset 8 days ago  Fever: no Cough: yes, productive of mucus Shortness of breath: no Chest pain: no Chest tightness:  a little Chest congestion: yes Nasal congestion:  some Runny nose: yes Sore throat: no Sinus pressure: no Headache: no Ear pain: no  Ear pressure: no  Vomiting: no Sick contacts: no Context: worse the past few days Treatments attempted: corcidin, mucinex    OBJECTIVE:   BP 135/69 (BP Location: Right Arm, Patient Position: Sitting, Cuff Size: Large)   Pulse 60   Temp 98.2 F (36.8 C) (Temporal)   Resp 16   Wt 262 lb (118.8 kg)   SpO2 98%   BMI 35.53 kg/m   Gen: well appearing, in NAD Card: RRR Lungs: CTAB Ext: WWP, no edema  ASSESSMENT/PLAN:   VIRAL URI Mild-mod sx. Outside COVID treatment and quarantine window, will defer testing. No signs or symptoms to concern for bacterial infection. Recommend OTC decongestant for symptom relief. Reviewed return and emergency precautions.    Myles Gip, DO

## 2022-07-18 ENCOUNTER — Ambulatory Visit: Payer: Self-pay | Admitting: *Deleted

## 2022-07-18 DIAGNOSIS — H6690 Otitis media, unspecified, unspecified ear: Secondary | ICD-10-CM

## 2022-07-18 MED ORDER — AMOXICILLIN 500 MG PO CAPS: 1000.0000 mg | ORAL_CAPSULE | Freq: Two times a day (BID) | ORAL | 0 refills | Status: AC

## 2022-07-18 NOTE — Telephone Encounter (Signed)
Summary: pt not better/now has ear infection   Pt seen by Dr Ky Barban this past Tues.  Not given any meds.  Pt states now his L ear has pain, and cough is not better.  Pt thinks he has developed an ear infection along w/ his other sx.  Pt declined appt, and asked that I send message to the dr.  Please advise.       Chief Complaint: left ear congestion cough continues requesting medication Symptoms: cough remains with coughing spells causing back pain and productive cough dark tan sputum. Reports ear congestion started Wednesday . Requesting medication Frequency: Wednesday 07/16/22 Pertinent Negatives: Patient denies chest pain no difficulty breathing no fever, no drainage from ear. No dizziness Disposition: '[]'$ ED /'[]'$ Urgent Care (no appt availability in office) / '[]'$ Appointment(In office/virtual)/ '[]'$  [facility]/ '[]'$ Home Care/ '[]'$ Refused Recommended Disposition /'[]'$  Mobile Bus/ '[x]'$  Follow-up with PCP Additional Notes:   Last seen OV for similar/ related sx. Now worsening cough and ear congestion. Patient requesting medication prior to weekend for relief. OTC medications are not helping .  Mucinex, antihistamines nasal sprays. Please advise if another appt needed.         Reason for Disposition  Ear congestion present > 48 hours  Answer Assessment - Initial Assessment Questions 1. LOCATION: "Which ear is involved?"       Left ear 2. SENSATION: "Describe how the ear feels." (e.g. stuffy, full, plugged)."      Stuffy , full 3. ONSET:  "When did the ear symptoms start?"       Late Wednesday afternoon 4. PAIN: "Do you also have an earache?" If Yes, ask: "How bad is it?" (Scale 1-10; or mild, moderate, severe)     Mild  5. CAUSE: "What do you think is causing the ear congestion?"     URI last seen in [facility] 07/15/22 6. URI: "Do you have a runny nose or cough?"      Cough , cough so much back hurts. Coughing dark tan sputum  7. NASAL ALLERGIES: "Are there symptoms of hay  fever, such as sneezing or a clear nasal discharge?"     na 8. PREGNANCY: "Is there any chance you are pregnant?" "When was your last menstrual period?"     na  Protocols used: Ear - Congestion-A-AH

## 2022-07-18 NOTE — Telephone Encounter (Signed)
Patient advised.

## 2022-07-18 NOTE — Telephone Encounter (Signed)
Prescription for amoxicillin sent to [facility]

## 2022-08-02 ENCOUNTER — Other Ambulatory Visit: Payer: Self-pay | Admitting: Family Medicine

## 2022-08-27 ENCOUNTER — Other Ambulatory Visit: Payer: Self-pay | Admitting: Family Medicine

## 2022-09-05 ENCOUNTER — Other Ambulatory Visit: Payer: PPO

## 2022-09-05 DIAGNOSIS — C61 Malignant neoplasm of prostate: Secondary | ICD-10-CM | POA: Diagnosis not present

## 2022-09-06 LAB — PSA: Prostate Specific Ag, Serum: 11 ng/mL — ABNORMAL HIGH (ref 0.0–4.0)

## 2022-09-11 ENCOUNTER — Encounter: Payer: Self-pay | Admitting: Urology

## 2022-09-11 ENCOUNTER — Ambulatory Visit: Payer: PPO | Admitting: Urology

## 2022-09-11 VITALS — BP 153/93 | HR 62 | Ht 72.0 in | Wt 258.0 lb

## 2022-09-11 DIAGNOSIS — C61 Malignant neoplasm of prostate: Secondary | ICD-10-CM | POA: Diagnosis not present

## 2022-09-11 DIAGNOSIS — N529 Male erectile dysfunction, unspecified: Secondary | ICD-10-CM | POA: Diagnosis not present

## 2022-09-11 MED ORDER — SILDENAFIL CITRATE 100 MG PO TABS: ORAL_TABLET | ORAL | 11 refills | Status: DC

## 2022-09-11 NOTE — Progress Notes (Signed)
   09/11/2022 12:29 PM   Steven Saunders. [birth year] [id number]  Reason for visit: Follow up prostate cancer, BPH, ED   HPI: I saw [name] back in urology clinic today for active surveillance for favorable intermediate risk prostate cancer.  He is a 72 year old male with a cardiac history, mild lower urinary tract symptoms on BPH, and erectile dysfunction on PDE5i.  He has stopped the Flomax and did not notice any worsening of the urinary symptoms.  He has changed over to the sildenafil 50 to 100 mg as needed for ED.  He originally underwent a prostate biopsy for an elevated PSA of 7.3 with Dr. Pilar Jarvis in November 2018.  This showed low risk disease with Gleason score 3+3=6 in 8/12 cores, with max core involvement of 37%.  He elected for active surveillance with confirmatory biopsy.  He underwent a confirmatory biopsy with myself 07/20/2018, with PSA of 6.3.  This showed primarily Gleason score 3+3=6 in 9/12 cores, however there was a single core of Gleason score 3+4=7 with grade 4 comprising 10% of the tumor.  Max core involvement overall was 40%.  Prostate volume was 51 g, and PSA density was 0.12.  After an extensive conversation and shared decision making, he opted for active surveillance.   We again discussed the AUA guidelines regarding low risk, intermediate risk, and high risk disease and treatment strategies including active surveillance versus intervention with surgery or radiation.  He remains adamant about remaining on active surveillance.  We again reviewed that active surveillance needs to be chosen very carefully for favorable intermediate risk patients, and there is a higher risk of developing metastatic disease while on surveillance.  He understands these risks and would like to continue active surveillance.  PSA continues to slowly increase, most recently 11.0 on 09/05/2022 from 8.5 in January 2023 and 9.7 in July 2022, and 8.9 in January 2021.  DRE has been benign, but limited  significantly by body habitus.    We had another discussion about alternatives for further risk stratification including prostate MRI(artificial hip and would need clearance for MRI, unclear if compatible), Oncotype DX/GPS(tissue required within 30 months, and last biopsy in 2019 so not an option at this point without a repeat biopsy), or repeat prostate biopsy.  He remains averse to biopsy, but I again stressed the importance of the role of biopsy at least every 3 to 5 years in patients with active surveillance, especially those with favorable intermediate risk disease.   Using shared decision making, he ultimately was amenable to a prostate MRI(if artificial hip compatible), and potential need for MRI fusion biopsy if abnormal findings seen on prostate MRI.  If unable to get prostate MRI, would again strongly recommend biopsy with his favorable intermediate risk disease, and no biopsy in the last 5 years.  -Sildenafil refilled for ED -Prostate MRI, will schedule MRI fusion biopsy if any abnormal findings -If artificial hip not MRI compatible, again recommend scheduling a standard transrectal ultrasound-guided biopsy  Billey Co, MD  [place] [address], [place], [place] [address] [phone]

## 2022-09-30 ENCOUNTER — Ambulatory Visit
Admission: RE | Admit: 2022-09-30 | Discharge: 2022-09-30 | Disposition: A | Discharge status: Home / Self Care | Payer: PPO | Source: Ambulatory Visit | Attending: Urology | Admitting: Urology

## 2022-09-30 DIAGNOSIS — C7951 Secondary malignant neoplasm of bone: Secondary | ICD-10-CM | POA: Diagnosis not present

## 2022-09-30 DIAGNOSIS — C61 Malignant neoplasm of prostate: Secondary | ICD-10-CM | POA: Diagnosis not present

## 2022-09-30 DIAGNOSIS — R590 Localized enlarged lymph nodes: Secondary | ICD-10-CM | POA: Diagnosis not present

## 2022-09-30 DIAGNOSIS — Z8546 Personal history of malignant neoplasm of prostate: Secondary | ICD-10-CM | POA: Diagnosis not present

## 2022-09-30 MED ORDER — GADOBUTROL 1 MMOL/ML IV SOLN
10.0000 mL | Freq: Once | INTRAVENOUS | Status: AC | PRN
  Administered 2022-09-30: 10 mL via INTRAVENOUS

## 2022-10-07 ENCOUNTER — Encounter: Payer: Self-pay | Admitting: Urology

## 2022-10-07 ENCOUNTER — Ambulatory Visit (INDEPENDENT_AMBULATORY_CARE_PROVIDER_SITE_OTHER): Payer: PPO | Admitting: Urology

## 2022-10-07 VITALS — BP 154/81 | HR 62

## 2022-10-07 DIAGNOSIS — C61 Malignant neoplasm of prostate: Secondary | ICD-10-CM

## 2022-10-07 NOTE — Patient Instructions (Signed)
Prostate Cancer  The prostate is a small gland that produces fluid that makes up semen (seminal fluid). It is located below the bladder in men, in front of the rectum. Prostate cancer is the abnormal growth of cells in the prostate gland. What are the causes? The exact cause of this condition is not known. What increases the risk? You are more likely to develop this condition if: You are 74 years of age or older. You have a family history of prostate cancer. You have a family history of breast and ovarian cancer. You have genes that are passed from parent to child (inherited), such as BRCA1 and BRCA2. You have Lynch syndrome. African American men and men of African descent are diagnosed with prostate cancer at higher rates than other men. The reasons for this are not well understood and are likely due to a combination of genetic and environmental factors. What are the signs or symptoms? Symptoms of this condition include: Problems with urination. This may include: A weak or interrupted flow of urine. Trouble starting or stopping urination. Trouble emptying the bladder all the way. The need to urinate more often, especially at night. Blood in urine or semen. Persistent pain or discomfort in the lower back, lower abdomen, or hips. Trouble getting an erection. Weakness or numbness in the legs or feet. How is this diagnosed? This condition can be diagnosed with: A digital rectal exam. For this exam, a health care provider inserts a gloved finger into the rectum to feel the prostate gland. A blood test called a prostate-specific antigen (PSA) test. A procedure in which a sample of tissue is taken from the prostate and checked under a microscope (prostate biopsy). An imaging test called transrectal ultrasonography. Once the condition is diagnosed, tests will be done to determine how far the cancer has spread. This is called staging the cancer. Staging may involve imaging tests, such as a bone  scan, CT scan, PET scan, or MRI. Stages of prostate cancer The stages of prostate cancer are as follows: Stage 1 (I). At this stage, the cancer is found in the prostate only. The cancer is not visible on imaging tests, and it is usually found by accident, such as during prostate surgery. Stage 2 (II). At this stage, the cancer is more advanced than it is in stage 1, but the cancer has not spread outside the prostate. Stage 3 (III). At this stage, the cancer has spread beyond the outer layer of the prostate to nearby tissues. The cancer may be found in the seminal vesicles, which are near the bladder and the prostate. Stage 4 (IV). At this stage, the cancer has spread to other parts of the body, such as the lymph nodes, bones, bladder, rectum, liver, or lungs. Prostate cancer grading Prostate cancer is also graded according to how the cancer cells look under a microscope. This is called the Gleason score and the total score can range from 6-10, indicating how likely it is that the cancer will spread (metastasize) to other parts of the body. The higher the score, the greater the likelihood that the cancer will spread. Gleason 6 or lower: This indicates that the cancer cells look similar to normal prostate cells (well differentiated). Gleason 7: This indicates that the cancer cells look somewhat similar to normal prostate cells (moderately differentiated). Gleason 8, 9, or 10: This indicates that the cancer cells look very different than normal prostate cells (poorly differentiated). How is this treated? Treatment for this condition depends on several  factors, including the stage of the cancer, your age, personal preferences, and your overall health. Talk with your health care provider about treatment options that are recommended for you. Common treatments include: Observation for early stage prostate cancer (active surveillance). This involves having exams, blood tests, and in some cases, more biopsies.  For some men, this is the only treatment needed. Surgery. Types of surgeries include: Open surgery (radical prostatectomy). In this surgery, a larger incision is made to remove the prostate. A laparoscopic radical prostatectomy. This is a surgery to remove the prostate and lymph nodes through several small incisions. It is often referred to as a minimally invasive surgery. A robotic radical prostatectomy. This is laparoscopic surgery to remove the prostate and lymph nodes with the help of robotic arms that are controlled by the surgeon. Cryoablation. This is surgery to freeze and destroy cancer cells. Radiation treatment. Types of radiation treatment include: External beam radiation. This type aims beams of radiation from outside the body at the prostate to destroy cancerous cells. Brachytherapy. This type uses radioactive needles, seeds, wires, or tubes that are implanted into the prostate gland. Like external beam radiation, brachytherapy destroys cancerous cells. An advantage is that this type of radiation limits the damage to surrounding tissue and has fewer side effects. Chemotherapy. This treatment kills cancer cells or stops them from multiplying. It kills both cancer cells and normal cells. Targeted therapy. This treatment uses medicines to kill cancer cells without damaging normal cells. Hormone treatment. This treatment involves taking medicines that act on testosterone, one of the male hormones, by: Stopping your body from producing testosterone. Blocking testosterone from reaching cancer cells. Follow these instructions at home: Lifestyle Do not use any products that contain nicotine or tobacco. These products include cigarettes, chewing tobacco, and vaping devices, such as e-cigarettes. If you need help quitting, ask your health care provider. Eat a healthy diet. To do this: Eat foods that are high in fiber. These include beans, whole grains, and fresh fruits and vegetables. Limit  foods that are high in fat and sugar. These include fried or sweet foods. Treatment for prostate cancer may affect sexual function. If you have a partner, continue to have intimate moments. This may include touching, holding, hugging, and caressing your partner. Get plenty of sleep. Consider joining a support group for men who have prostate cancer. Meeting with a support group may help you learn to manage the stress of having cancer. General instructions Take over-the-counter and prescription medicines only as told by your health care provider. If you have to go to the hospital, notify your cancer specialist (oncologist). Keep all follow-up visits. This is important. Where to find more information American Cancer Society: www.cancer.[place] [employer]: www.cancer.net [employer]: www.cancer.gov Contact a health care provider if: You have new or increasing trouble urinating. You have new or increasing blood in your urine. You have new or increasing pain in your hips, back, or chest. Get help right away if: You have weakness or numbness in your legs. You cannot control urination or your bowel movements (incontinence). You have chills or a fever. Summary The prostate is a small gland that is involved in the production of semen. It is located below a man's bladder, in front of the rectum. Prostate cancer is the abnormal growth of cells in the prostate gland. Treatment for this condition depends on the stage of the cancer, your age, personal preferences, and your overall health. Talk with your health care provider about  treatment options that are recommended for you. Consider joining a support group for men who have prostate cancer. Meeting with a support group may help you learn to manage the stress of having cancer. This information is not intended to replace advice given to you by your health care provider. Make sure you discuss any questions you have with  your health care provider. Document Revised: 11/14/2020 Document Reviewed: 11/14/2020 Elsevier Patient Education  2023 [employer].  Robot-Assisted Laparoscopic Radical Prostatectomy  Robot-assisted laparoscopic radical prostatectomy is surgery done to remove the entire prostate and nearby tissue. This includes the seminal vesicles, which are near the bladder and the prostate. This procedure is done to treat prostate cancer that has not spread (metastasized) to other parts of the body. The goal of the surgery is to remove all cancer cells to help keep the cancer from metastasizing. During this procedure, the surgeon makes several incisions in the abdomen instead of one large incision. A long, thin, lighted tube with a tiny camera on the end (laparoscope) is put into one of the incisions. This allows the surgeon to see inside the abdomen. Other surgical tools are put in through the other incisions and used to take out the prostate and nearby tissues. The surgeon uses robotic arms to control these tools while sitting at a computer near the operating table. Lymph nodes in the pelvis may also be removed. Lymph nodes are part of the body's disease-fighting system (immune system). When prostate cancer spreads, it tends to go to the lymph nodes in the pelvis first. If the pelvic lymph nodes are removed, they will be checked for cancer cells. Tell a health care provider about: Any allergies you have. All medicines you are taking, including vitamins, herbs, eye drops, creams, and over-the-counter medicines. Any problems you or family members have had with anesthetic medicines. Any bleeding problems you have. Any surgeries you have had. Any medical conditions you have. Any prostate infections you have had. What are the risks? Generally, this is a safe procedure. Still, problems may occur, including: Infection. Bleeding. Allergic reactions to medicines. Damage to nearby structures or organs, such as the  rectum, ureters, urethra, bladder, or small intestine. Blockage (obstruction) of the large or small intestines. Problems that affect urination or sexual function. These may include: Narrowing or scarring of the urethra (stricture), which may block the flow of urine. Inability to control when you urinate (incontinence). Inability to get or keep an erection (erectile dysfunction). Dry ejaculation. This is when no semen comes out during orgasm. The formation of a sac (cyst) in the pelvis that is filled with fluid from the lymph glands (lymphocele). Blood clots in the legs. What happens before the procedure? Staying hydrated Follow instructions from your health care provider about hydration, which may include: Up to 2 hours before the procedure - you may continue to drink clear liquids, such as water, clear fruit juice, black coffee, and plain tea.  Eating and drinking restrictions Follow instructions from your health care provider about eating and drinking, which may include: 8 hours before the procedure - stop eating heavy meals or foods, such as meat, fried foods, or fatty foods. 6 hours before the procedure - stop eating light meals or foods, such as toast or cereal. 6 hours before the procedure - stop drinking milk or drinks that contain milk. 2 hours before the procedure - stop drinking clear liquids. Medicines Ask your health care provider about: Changing or stopping your regular medicines. This is especially important   if you are taking diabetes medicines or blood thinners. Taking medicines such as aspirin and ibuprofen. These medicines can thin your blood. Do not take these medicines unless your health care provider tells you to take them. Taking over-the-counter medicines, vitamins, herbs, and supplements. Follow your health care provider's instructions about cleaning out your bowels. Surgery safety Ask your health care provider: How your surgery site will be marked. What steps will  be taken to help prevent infection. These steps may include: Removing hair at the surgery site. Washing skin with a germ-killing soap. Taking antibiotic medicine. General instructions Do not use any products that contain nicotine or tobacco for at least 4 weeks before the procedure. These products include cigarettes, chewing tobacco, and vaping devices, such as e-cigarettes. If you need help quitting, ask your health care provider. Plan to have a responsible adult take you home from the hospital or clinic. Plan to have a responsible adult care for you for the time you are told after you leave the hospital or clinic. You may have an exam or testing. This may include blood or urine samples, or imaging tests such as a CT scan or an MRI. What happens during the procedure? An IV will be put into a vein in your hand or arm. You may be given: A medicine to help you relax (sedative). A medicine to make you fall asleep (general anesthetic). A thin, flexible tube (Foley catheter) will be put into your penis through your urethra and into your bladder to drain your urine. Small incisions will be made in your abdomen and near your belly button. The laparoscope and other surgical instruments will be put through the incisions. The surgical tools will be used to cut and remove your prostate, seminal vesicles, and maybe your pelvic lymph nodes. Your surgeon will use a computer and robotic arms to control the surgical instruments. Your urethra will be cut and separated from your bladder to take out the prostate. Your urethra will then be reconnected to your bladder neck. This is the group of muscles that help push urine through your urethra. A small tube (drain) may be put in one or more of your incisions to help drain extra fluid from your surgical site after surgery. The laparoscope and other surgical instruments will be removed. Your incisions will be closed with stitches (sutures), skin glue, or adhesive  strips. Medicine may be applied and bandages (dressings) will be placed over your incisions. The procedure may vary among health care providers and hospitals. What happens after the procedure? Your blood pressure, heart rate, breathing rate, and blood oxygen level will be monitored until you leave the hospital or clinic. You may get fluids and medicines through your IV. You may be given antibiotics and medicines to help relieve pain or nausea. You will be encouraged to walk as soon as possible. You will also use a device or do breathing exercises to keep your lungs clear. The catheter will stay in to drain urine from your bladder. You will be taught how to care for it at home. The drain may stay in to drain fluid from the surgical site. If so, you will be taught how to care for it at home. You may need to wear compression stockings until you are able to get up and walk around. These stockings help prevent blood clots and reduce swelling in your legs. If you were given a sedative during the procedure, it can affect you for several hours. Do not drive or operate   machinery until your health care provider says that it is safe. Summary Robot-assisted laparoscopic radical prostatectomy is a surgical procedure to remove the entire prostate and the seminal vesicles. Follow instructions from your health care provider about eating and drinking before your surgery. After your procedure, you may be given fluids and medicines through an IV. You may get antibiotics and medicines to help relieve pain or nausea. After your surgery, you will continue to have a small, thin tube (Foley catheter) draining your urine. You will be taught how to care for it at home. This information is not intended to replace advice given to you by your health care provider. Make sure you discuss any questions you have with your health care provider. Document Revised: 11/14/2020 Document Reviewed: 11/14/2020 Elsevier Patient Education   [place].  External Beam Radiation Therapy for Prostate Cancer You will learn how radiation (dispersed outside of the body) is used to kill cancer cells. Its advantages, risks and side effects are also explained. To view the content, go to this web address: https://pe.elsevier.629-149-8962  This video will expire on: 05/06/2024. If you need access to this video following this date, please reach out to the healthcare provider who assigned it to you. This information is not intended to replace advice given to you by your health care provider. Make sure you discuss any questions you have with your health care provider. Elsevier Patient Education  [place].

## 2022-10-07 NOTE — Progress Notes (Signed)
10/07/2022 1:04 PM   Steven Saunders. March 21, 1951 [id number]  Reason for visit: Follow up prostate cancer, BPH, ED   HPI: I saw Steven Saunders back in urology clinic today to review his recent prostate MRI.  He is a 72 year old male with a cardiac history, mild lower urinary tract symptoms on BPH(now off flomax), and erectile dysfunction on PDE5i.  .  He originally underwent a prostate biopsy for an elevated PSA of 7.3 with Dr. Pilar Jarvis in November 2018.  This showed low risk disease with Gleason score 3+3=6 in 8/12 cores, with max core involvement of 37%.  He elected for active surveillance with confirmatory biopsy.  He underwent a confirmatory biopsy with myself 07/20/2018, with PSA of 6.3.  This showed primarily Gleason score 3+3=6 in 9/12 cores, however there was a single core of Gleason score 3+4=7 with grade 4 comprising 10% of the tumor.  Max core involvement overall was 40%.  Prostate volume was 51 g, and PSA density was 0.12.  After an extensive conversation and shared decision making, he opted for active surveillance.   PSA had continued to slowly rise over the last few years, most recently 11.0 on 09/05/2022.  He remains fairly adamant about remaining on active surveillance, but using shared decision making he was amenable to a prostate MRI.  DRE has been limited by body habitus.  I personally viewed and interpreted the prostate MRI dated 09/30/2022 that shows a 49g prostate with PI-RADS category 5 lesion in the right anterior lateral base and left anterior lateral transition zone, as well as some irregular appearance along the capsule concerning for possible extracapsular extension and possible SV involvement at the right base.  We reviewed these concerning MRI findings today, and I strongly recommended considering definitive treatment at this point with either surgery or radiation.  Other alternatives would be a repeat biopsy, but high suspicion this would continue to show intermediate or  high risk disease, and would not change our treatment recommendations.  We discussed the roles of active surveillance, radiation therapy, surgical therapy with robotic prostatectomy, and hormone therapy with androgen deprivation.  We discussed that patients urinary symptoms also impact treatment strategy, as patients with severe lower urinary tract symptoms may have significant worsening or even develop urinary retention after undergoing radiation.  In regards to surgery, we discussed robotic prostatectomy +/- lymphadenectomy at length.  The procedure takes 3 to 4 hours, and patient's typically discharge home on post-op day #1.  A Foley catheter is left in place for 7 to 10 days to allow for healing of the vesicourethral anastomosis.  There is a small risk of bleeding, infection, damage to surrounding structures or bowel, hernia, DVT/PE, or serious cardiac or pulmonary complications.  We discussed at length post-op side effects including erectile dysfunction, and the importance of pre-operative erectile function on long-term outcomes.  Even with a nerve sparing approach, there is an approximately 25% rate of permanent erectile dysfunction.  We also discussed postop urinary incontinence at length.  We expect patients to have stress incontinence post-operatively that will improve over period of weeks to months.  Less than 10% of men will require a pad at 1 year after surgery.  Patients will need to avoid heavy lifting and strenuous activity for 3 to 4 weeks, but most men return to their baseline activity status by 6 weeks.  Patient leaning towards radiation, referral placed to radiation oncology to consider XRT   Steven Saunders, [facility] [address]  [address], [place], [place] [address] [phone]

## 2022-10-15 ENCOUNTER — Ambulatory Visit
Admission: RE | Admit: 2022-10-15 | Discharge: 2022-10-15 | Disposition: A | Discharge status: Home / Self Care | Payer: PPO | Source: Ambulatory Visit | Attending: Radiation Oncology | Admitting: Radiation Oncology

## 2022-10-15 ENCOUNTER — Other Ambulatory Visit: Payer: Self-pay | Admitting: *Deleted

## 2022-10-15 ENCOUNTER — Encounter: Payer: Self-pay | Admitting: Radiation Oncology

## 2022-10-15 VITALS — BP 152/92 | HR 75 | Temp 98.0°F | Resp 16 | Ht 72.0 in | Wt 265.0 lb

## 2022-10-15 DIAGNOSIS — C61 Malignant neoplasm of prostate: Secondary | ICD-10-CM | POA: Diagnosis not present

## 2022-10-15 DIAGNOSIS — Z191 Hormone sensitive malignancy status: Secondary | ICD-10-CM | POA: Diagnosis not present

## 2022-10-15 DIAGNOSIS — I10 Essential (primary) hypertension: Secondary | ICD-10-CM | POA: Diagnosis not present

## 2022-10-15 DIAGNOSIS — Z87891 Personal history of nicotine dependence: Secondary | ICD-10-CM | POA: Diagnosis not present

## 2022-10-15 DIAGNOSIS — Z7982 Long term (current) use of aspirin: Secondary | ICD-10-CM | POA: Insufficient documentation

## 2022-10-15 DIAGNOSIS — Z8546 Personal history of malignant neoplasm of prostate: Secondary | ICD-10-CM | POA: Diagnosis not present

## 2022-10-15 DIAGNOSIS — Z79899 Other long term (current) drug therapy: Secondary | ICD-10-CM | POA: Diagnosis not present

## 2022-10-15 DIAGNOSIS — I4821 Permanent atrial fibrillation: Secondary | ICD-10-CM | POA: Insufficient documentation

## 2022-10-15 DIAGNOSIS — R351 Nocturia: Secondary | ICD-10-CM | POA: Insufficient documentation

## 2022-10-15 NOTE — Consult Note (Signed)
NEW PATIENT EVALUATION  Name: Steven Saunders.  MRN: [id number]  Date:   10/15/2022     DOB: [birth year]   This 72 y.o. male patient presents to the clinic for initial evaluation of clinical stage IIIb (cT3b N0 M0) Gleason 7 (3+4) adenocarcinoma the prostate with a PSA of 11.  REFERRING PHYSICIAN: Birdie Sons, MD  CHIEF COMPLAINT:  Chief Complaint  Patient presents with   Prostate Cancer    DIAGNOSIS: The encounter diagnosis was Malignant neoplasm of prostate ([place]).   PREVIOUS INVESTIGATIONS:  MRI scan reviewed PET PSMA ordered Pathology reports reviewed Clinical notes reviewed  HPI: Patient is a 72 year old male originally presented with adenocarcinoma the prostate when he presented with a PSA of 7.3 back in 2018.  Biopsy was positive for Gleason 6 and 8 of 12 cores.  He underwent active surveillance PSA climbed to 6.3 in 2019 and again repeat biopsy again showed 9 of 12 cores positive for Gleason 6.  There was a single core of Gleason 7 (3+4).  His PSA in January of this year was 70 he had an MRI of his prostate showing a PI-RADS category 5 lesion right anterior lateral base also a PI-RADS category 5 lesion in the left anterior lateral transitional zone he had a irregular appearance along the capsule with signs of extracapsular extension as well as suspicion for seminal vesicle involvement of the right base.  He is seen today for consideration of treatment.  He has nocturia x 3 no significant urgency and frequency during the day.  No significant bowel problems.  PLANNED TREATMENT REGIMEN: IMRT radiation therapy  PAST MEDICAL HISTORY:  has a past medical history of Dysrhythmia, Hypertension, Permanent atrial fibrillation ([place]), and Prostate cancer ([place]).    PAST SURGICAL HISTORY:  Past Surgical History:  Procedure Laterality Date   APPENDECTOMY     CARDIAC CATHETERIZATION  2007   COLONOSCOPY WITH PROPOFOL N/A 05/26/2017   Procedure: COLONOSCOPY WITH PROPOFOL;  Surgeon:  [name], MD;  Location: [facility] ENDOSCOPY;  Service: Endoscopy;  Laterality: N/A;   COLONOSCOPY WITH PROPOFOL N/A 06/17/2022   Procedure: COLONOSCOPY WITH PROPOFOL;  Surgeon: [name], MD;  Location: [facility] ENDOSCOPY;  Service: Endoscopy;  Laterality: N/A;   HERNIA REPAIR     1950's x 2   JOINT REPLACEMENT Left 2014   PROSTATE BIOPSY     thumb surgery  1980   had staph infection and had to cut out the area   [place] Left 2014   Dr. Sabra Heck   TOTAL HIP ARTHROPLASTY Right 05/04/2019   Procedure: [place];  Surgeon: [name], MD;  Location: [facility] ORS;  Service: Orthopedics;  Laterality: Right;    FAMILY HISTORY: family history includes Heart attack in his father; Hypertension in his mother; Melanoma in his father.  SOCIAL HISTORY:  reports that he quit smoking about 12 years ago. His smoking use included cigarettes. He has a 30.00 pack-year smoking history. He has never used smokeless tobacco. He reports current alcohol use of about 4.0 - 8.0 standard drinks of alcohol per week. He reports that he does not use drugs.  ALLERGIES: Patient has no known allergies.  MEDICATIONS:  Current Outpatient Medications  Medication Sig Dispense Refill   acetaminophen (TYLENOL) 500 MG tablet Take 500 mg by mouth daily as needed.      aspirin EC 81 MG tablet Take 81 mg by mouth daily. Swallow whole.     carvedilol (COREG) 25 MG tablet TAKE  1 TABLET BY MOUTH TWICE DAILY 180 tablet 4   Cholecalciferol (VITAMIN D-3) 25 MCG (1000 UT) CAPS Take 1 capsule by mouth daily.     digoxin (LANOXIN) 0.125 MG tablet TAKE ONE TABLET BY MOUTH DAILY 90 tablet 2   docusate sodium (COLACE) 100 MG capsule Take 100 mg by mouth daily.     glucosamine-chondroitin 500-400 MG tablet Take 1 tablet by mouth daily.     irbesartan (AVAPRO) 150 MG tablet Take 1 tablet (150 mg total) by mouth daily. 90 tablet 3   loratadine (CLARITIN) 10 MG tablet Take 10 mg by mouth daily.      lovastatin (MEVACOR) 20 MG tablet TAKE 1 TABLET BY MOUTH ONCE EVERY EVENING 90 tablet 3   Multiple Vitamin (MULTIVITAMIN) tablet Take 1 tablet by mouth daily.     sildenafil (VIAGRA) 100 MG tablet Take 66m-100mg by mouth as needed prior to intercourse 30 tablet 11   spironolactone (ALDACTONE) 25 MG tablet TAKE 1 TABLET BY MOUTH ONCE DAILY 90 tablet 3   valACYclovir (VALTREX) 1000 MG tablet TAKE 2 TABLETS BY MOUTH EVERY 12 HOURS AS NEEDED AS DIRECTED 20 tablet 2   No current facility-administered medications for this encounter.    ECOG PERFORMANCE STATUS:  0 - Asymptomatic  REVIEW OF SYSTEMS: Patient denies any weight loss, fatigue, weakness, fever, chills or night sweats. Patient denies any loss of vision, blurred vision. Patient denies any ringing  of the ears or hearing loss. No irregular heartbeat. Patient denies heart murmur or history of fainting. Patient denies any chest pain or pain radiating to her upper extremities. Patient denies any shortness of breath, difficulty breathing at night, cough or hemoptysis. Patient denies any swelling in the lower legs. Patient denies any nausea vomiting, vomiting of blood, or coffee ground material in the vomitus. Patient denies any stomach pain. Patient states has had normal bowel movements no significant constipation or diarrhea. Patient denies any dysuria, hematuria or significant nocturia. Patient denies any problems walking, swelling in the joints or loss of balance. Patient denies any skin changes, loss of hair or loss of weight. Patient denies any excessive worrying or anxiety or significant depression. Patient denies any problems with insomnia. Patient denies excessive thirst, polyuria, polydipsia. Patient denies any swollen glands, patient denies easy bruising or easy bleeding. Patient denies any recent infections, allergies or URI. Patient "s visual fields have not changed significantly in recent time.   PHYSICAL EXAM: BP (!) 152/92 (BP Location:  Left Arm, Patient Position: Sitting, Cuff Size: Normal) Comment: Patient states he is usually WNL  Pulse 75   Temp 98 F (36.7 C) (Tympanic)   Resp 16   Ht 6' (1.829 m)   Wt 265 lb (120.2 kg)   BMI 35.94 kg/m  Slightly obese well-developed well-nourished patient in NAD. HEENT reveals PERLA, EOMI, discs not visualized.  Oral cavity is clear. No oral mucosal lesions are identified. Neck is clear without evidence of cervical or supraclavicular adenopathy. Lungs are clear to A&P. Cardiac examination is essentially unremarkable with regular rate and rhythm without murmur rub or thrill. Abdomen is benign with no organomegaly or masses noted. Motor sensory and DTR levels are equal and symmetric in the upper and lower extremities. Cranial nerves II through XII are grossly intact. Proprioception is intact. No peripheral adenopathy or edema is identified. No motor or sensory levels are noted. Crude visual fields are within normal range.  LABORATORY DATA: Pathology reports reviewed    RADIOLOGY RESULTS: RI scan reviewed PSMA PET  scan ordered   IMPRESSION: At least stage IIIb Gleason 7 adenocarcinoma the prostate in 72 year old male  PLAN: This time of ordered a PSMA PET scan to better delineate possibility of bone metastasis as well as pelvic adenopathy and tumor extent in his prostate.  I will use this in determining my treatment fields.  Would plan on delivering 33 [place] Cables to his prostate.  According to [facility] nomogram patient has a24% chance of lymph node involvement so we will cover his pelvic lymph nodes as well as his prostate and seminal vesicles.  Risks and benefits of treatment including increased lower urinary tract symptoms diarrhea fatigue alteration blood counts skin reaction all were discussed in detail with the patient we will set up simulation appointment after PSMA PET is complete.  I also like the patient to be started on Eligard and probably will benefit from at least 2 to 3  years of ADT treatment.  Patient comprehends her recommendations well.  I would like to take this opportunity to thank you for allowing me to participate in the care of your patient.Noreene Filbert, MD

## 2022-10-27 ENCOUNTER — Telehealth: Payer: Self-pay

## 2022-10-27 NOTE — Telephone Encounter (Signed)
Called [employer], No PA required for [id number] or CPT [id number].

## 2022-10-29 ENCOUNTER — Encounter: Payer: Self-pay | Admitting: Internal Medicine

## 2022-11-03 ENCOUNTER — Ambulatory Visit
Admission: RE | Admit: 2022-11-03 | Discharge: 2022-11-03 | Disposition: A | Discharge status: Home / Self Care | Payer: PPO | Source: Ambulatory Visit | Attending: Radiation Oncology | Admitting: Radiation Oncology

## 2022-11-03 DIAGNOSIS — I700 Atherosclerosis of aorta: Secondary | ICD-10-CM | POA: Diagnosis not present

## 2022-11-03 DIAGNOSIS — C61 Malignant neoplasm of prostate: Secondary | ICD-10-CM | POA: Diagnosis not present

## 2022-11-03 MED ORDER — PIFLIFOLASTAT F 18 (PYLARIFY) INJECTION
9.0000 | Freq: Once | INTRAVENOUS | Status: AC
  Administered 2022-11-03: 9.56 via INTRAVENOUS

## 2022-11-04 ENCOUNTER — Ambulatory Visit: Payer: PPO | Admitting: Physician Assistant

## 2022-11-05 ENCOUNTER — Telehealth: Payer: Self-pay

## 2022-11-05 ENCOUNTER — Telehealth: Payer: Self-pay | Admitting: Urology

## 2022-11-05 DIAGNOSIS — H35363 Drusen (degenerative) of macula, bilateral: Secondary | ICD-10-CM | POA: Diagnosis not present

## 2022-11-05 NOTE — Telephone Encounter (Signed)
Pt called and wants to go ahead to have his Prostate removed.  Please call to set up surg time. [phone]

## 2022-11-05 NOTE — Telephone Encounter (Signed)
Patient called stating he is declining Radiation treatment. He will be getting it removed.

## 2022-11-06 ENCOUNTER — Other Ambulatory Visit: Payer: Self-pay | Admitting: Urology

## 2022-11-06 ENCOUNTER — Telehealth: Payer: Self-pay

## 2022-11-06 DIAGNOSIS — C61 Malignant neoplasm of prostate: Secondary | ICD-10-CM

## 2022-11-06 NOTE — Progress Notes (Signed)
Surgical Physician Order Form [facility] [place]  * Scheduling expectation : Next Available  *Length of Case: 4 hrs  *Clearance needed: no  *Anticoagulation Instructions: Hold all anticoagulants  *Aspirin Instructions: Hold Aspirin  *Post-op visit Date/Instructions:  1 week cath removal with MD, discuss path  *Diagnosis: Prostate Cancer  *Procedure:     Robotic laparoscopic Prostatectomy [id number]) and bilateral lymph node dissection    Additional orders: N/A  -Admit type: OUTpatient  -Anesthesia: General  -VTE Prophylaxis Standing Order SCD's       Other: 500 units Subcutaneous heparin  -Standing Lab Orders Per Anesthesia    Lab other: None  -Standing Test orders EKG/Chest x-ray per Anesthesia       Test other:   - Medications:  Ancef 2gm IV  -Other orders:  N/A

## 2022-11-06 NOTE — Telephone Encounter (Signed)
I spoke with Steven Saunders. We have discussed possible surgery dates and Monday April 22nd, 2024 was agreed upon by all parties. Patient given information about surgery date, what to expect pre-operatively and post operatively.  We discussed that a Pre-Admission Testing office will be calling to set up the pre-op visit that will take place prior to surgery, and that these appointments are typically done over the phone with a Pre-Admissions RN. Informed patient that our office will communicate any additional care to be provided after surgery. Patients questions or concerns were discussed during our call. Advised to call our office should there be any additional information, questions or concerns that arise. Patient verbalized understanding.

## 2022-11-06 NOTE — Telephone Encounter (Signed)
Spoke with pt. Pt. Scheduled.

## 2022-11-06 NOTE — Progress Notes (Signed)
   [facility]  Surgery Date: Date: 12/22/2022  Surgeon: Dr. Nickolas Madrid, MD  Inpt ( No  )   Outpt (No)   Obs ( Yes  )   Diagnosis: C61 Prostate Cancer  -CPT: [phone], [phone]  Surgery: Robotic Laparoscopic Radial Prostatectomy with Bilateral Pelvic Lymph Node Dissection  Stop Anticoagulations: Yes and will need to hold ASA  Cardiac/Medical/Pulmonary Clearance needed: no  *Orders entered into EPIC  Date: 11/06/22   *Case booked in EPIC  Date: 11/06/22  *Notified pt of Surgery: Date: 11/06/22  PRE-OP UA & CX: no, will obtain CBC, CMP, PT/INR and Type Screen   *Placed into Prior Authorization Work [place] Date: 11/06/22  Assistant/laser/rep:No

## 2022-11-25 ENCOUNTER — Other Ambulatory Visit: Payer: Self-pay | Admitting: Family Medicine

## 2022-11-28 ENCOUNTER — Ambulatory Visit: Payer: PPO | Attending: Internal Medicine | Admitting: Internal Medicine

## 2022-11-28 ENCOUNTER — Encounter: Payer: Self-pay | Admitting: Internal Medicine

## 2022-11-28 VITALS — BP 142/80 | HR 57 | Ht 72.0 in | Wt 261.8 lb

## 2022-11-28 DIAGNOSIS — I10 Essential (primary) hypertension: Secondary | ICD-10-CM | POA: Diagnosis not present

## 2022-11-28 DIAGNOSIS — I4891 Unspecified atrial fibrillation: Secondary | ICD-10-CM

## 2022-11-28 NOTE — Patient Instructions (Addendum)
Medication Instructions:  Your physician recommends that you continue on your current medications as directed. Please refer to the Current Medication list given to you today.  *If you need a refill on your cardiac medications before your next appointment, please call your pharmacy*  Lab Work: None ordered.  If you have labs (blood work) drawn today and your tests are completely normal, you will receive your results only by: [place] (if you have MyChart) OR A paper copy in the mail If you have any lab test that is abnormal or we need to change your treatment, we will call you to review the results.  Testing/Procedures: None ordered.  Follow-Up: At [facility], you and your health needs are our priority.  As part of our continuing mission to provide you with exceptional heart care, we have created designated Provider Care Teams.  These Care Teams include your primary Cardiologist (physician) and Advanced Practice Providers (APPs -  Physician Assistants and Nurse Practitioners) who all work together to provide you with the care you need, when you need it.   Your next appointment:   1 year(s)  The format for your next appointment:   In Person  Provider:   Cristopher Peru, MD{or one of the following Advanced Practice Providers on your designated Care Team:   Tommye Standard, [place] Legrand Como "[facility]" [place], [place]

## 2022-11-28 NOTE — Progress Notes (Signed)
HPI [name] returns today for followup. He is a very pleasant 72 year old man with a history of hypertension, and atrial fibrillation. He continues to do well. 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  He denies chest pain, sob, or syncope. No edema. He admits to some dietary indiscretion and has not been able to lose weight. He has gained 2 more lbs. He refuses to take systemic anti-coagulation.  He has developed prostate CA and is pending prostatectomy. No Known Allergies   Current Outpatient Medications  Medication Sig Dispense Refill   acetaminophen (TYLENOL) 500 MG tablet Take 500 mg by mouth daily as needed.      aspirin EC 81 MG tablet Take 81 mg by mouth daily. Swallow whole.     carvedilol (COREG) 25 MG tablet TAKE 1 TABLET BY MOUTH TWICE DAILY 180 tablet 4   Cholecalciferol (VITAMIN D-3) 25 MCG (1000 UT) CAPS Take 1 capsule by mouth daily.     digoxin (LANOXIN) 0.125 MG tablet TAKE ONE TABLET BY MOUTH DAILY 90 tablet 2   docusate sodium (COLACE) 100 MG capsule Take 100 mg by mouth daily.     glucosamine-chondroitin 500-400 MG tablet Take 1 tablet by mouth daily.     irbesartan (AVAPRO) 150 MG tablet Take 1 tablet (150 mg total) by mouth daily. 90 tablet 3   loratadine (CLARITIN) 10 MG tablet Take 10 mg by mouth daily.     lovastatin (MEVACOR) 20 MG tablet TAKE 1 TABLET BY MOUTH ONCE EVERY EVENING 90 tablet 3   Multiple Vitamin (MULTIVITAMIN) tablet Take 1 tablet by mouth daily.     sildenafil (VIAGRA) 100 MG tablet Take 50mg -100mg  by mouth as needed prior to intercourse 30 tablet 11   spironolactone (ALDACTONE) 25 MG tablet TAKE 1 TABLET BY MOUTH ONCE DAILY 90 tablet 3   valACYclovir (VALTREX) 1000 MG tablet TAKE 2 TABLETS BY MOUTH EVERY 12 HOURS AS NEEDED AS DIRECTED 20 tablet 2   No current facility-administered medications for this visit.     Past Medical History:  Diagnosis Date   Dysrhythmia     Hypertension    Permanent atrial fibrillation (H[facility])    Prostate cancer (Steven Saunders)     ROS:   All systems reviewed and negative except as noted in the HPI.   Past Surgical History:  Procedure Laterality Date   APPENDECTOMY     CARDIAC CATHETERIZATION  2007   COLONOSCOPY WITH PROPOFOL N/A 05/26/2017   Procedure: COLONOSCOPY WITH PROPOFOL;  Surgeon: [name], MD;  Location[facility] ENDOSCOPY;  Service: Endoscopy;  Laterality: N/A;   COLONOSCOPY WITH PROPOFOL N/A 06/17/2022   Procedure: COLONOSCOPY WITH PROPOFOL;  Surgeon: [name], MD;  Location: [facility] ENDOSCOPY;  Service: Endoscopy;  Laterality: N/A;   HERNIA REPAIR     1950's x 2   JOINT REPLACEMENT Left 2014   PROSTATE BIOPSY     thumb surgery  1980   had staph infection and had to cut out the area   [place] Left 2014   Dr. Sabra Heck   TOTAL HIP ARTHROPLASTY Right 05/04/2019   Procedure: [place];  Surgeon: [name], MD;  Location: [facility] ORS;  Service: Orthopedics;  Laterality: Right;     Family History  Problem Relation Age of Onset   Hypertension Mother    Heart attack Father    Melanoma Father    Prostate cancer Neg  Hx    Bladder Cancer Neg Hx    Kidney cancer Neg Hx      Social History   Socioeconomic History   Marital status: Married    Spouse name: Not on file   Number of children: 2   Years of education: Not on file   Highest education level: Some college, no degree  Occupational History   Occupation: Retired    Comment: Still drives school bus  Tobacco Use   Smoking status: Former    Packs/day: 2.00    Years: 15.00    Additional pack years: 0.00    Total pack years: 30.00    Types: Cigarettes    Quit date: 11/12/2009    Years since quitting: 13.0   Smokeless tobacco: Never   Tobacco comments:    Pt states he quit around 2000?  Vaping Use   Vaping Use: Never used  Substance and Sexual Activity   Alcohol use: Yes    Alcohol/week: 4.0 - 8.0  standard drinks of alcohol    Types: 4 - 8 Cans of beer per week    Comment: 1-2 beers 4xs a week   Drug use: No   Sexual activity: Yes    Birth control/protection: None  Other Topics Concern   Not on file  Social History Narrative   Not on file   Social Determinants of Health   Financial Resource Strain: Low Risk  (02/17/2022)   Overall Financial Resource Strain (CARDIA)    Difficulty of Paying Living Expenses: Not hard at all  Food Insecurity: No Food Insecurity (02/17/2022)   Hunger Vital Sign    Worried About Running Out of Food in the Last Year: Never true    Ran Out of Food in the Last Year: Never true  Transportation Needs: No Transportation Needs (02/17/2022)   PRAPARE - Hydrologist (Medical): No    Lack of Transportation (Non-Medical): No  Physical Activity: Insufficiently Active (02/17/2022)   Exercise Vital Sign    Days of Exercise per Week: 3 days    Minutes of Exercise per Session: 30 min  Stress: No Stress Concern Present (02/17/2022)   [place]    Feeling of Stress : Not at all  Social Connections: Socially Isolated (02/17/2022)   Social Connection and Isolation Panel [NHANES]    Frequency of Communication with Friends and Family: Once a week    Frequency of Social Gatherings with Friends and Family: Once a week    Attends Religious Services: Never    Marine scientist or Organizations: No    Attends Archivist Meetings: Never    Marital Status: Married  Human resources officer Violence: Not At Risk (02/17/2022)   Humiliation, Afraid, Rape, and Kick questionnaire    Fear of Current or Ex-Partner: No    Emotionally Abused: No    Physically Abused: No    Sexually Abused: No     BP (!) 142/80   Pulse (!) 57   Ht 6' (1.829 m)   Wt 261 lb 12.8 oz (118.8 kg)   SpO2 98%   BMI 35.51 kg/m   Physical Exam:  Well appearing NAD HEENT: Unremarkable Neck:  No  JVD, no thyromegally Lymphatics:  No adenopathy Back:  No CVA tenderness Lungs:  Clear HEART:  Regular rate rhythm, no murmurs, no rubs, no clicks Abd:  soft, positive bowel sounds, no organomegally, no rebound, no guarding Ext:  2 plus  pulses, no edema, no cyanosis, no clubbing Skin:  No rashes no nodules Neuro:  CN II through XII intact, motor grossly intact  EKG - atrial fib with a controlled VR and RBBB  Assess/Plan: 1. Atrial fib - his VR is well controlled. He is asymptomatic. He still refuses to take an [place] or warfarin. He uses his exercise bike without difficulty though not as much as he should. 2. HTN -his bp is controlled. 3. Obesity - we discussed the improtance of weight loss. I discussed fasting again. 4. Dyslipidemia - he will continue his statin.  5. Preop - he is an acceptable surgical risk for upcoming prostate surgery. No additional testing is indicated at this time.  Carleene Overlie Kaston Faughn,MD

## 2022-12-12 ENCOUNTER — Encounter
Admission: RE | Admit: 2022-12-12 | Discharge: 2022-12-12 | Disposition: A | Discharge status: Home / Self Care | Payer: PPO | Source: Ambulatory Visit | Attending: Urology | Admitting: Urology

## 2022-12-12 ENCOUNTER — Other Ambulatory Visit: Payer: PPO

## 2022-12-12 ENCOUNTER — Other Ambulatory Visit: Payer: Self-pay

## 2022-12-12 ENCOUNTER — Telehealth: Payer: Self-pay

## 2022-12-12 HISTORY — DX: Heart failure, unspecified: I50.9

## 2022-12-12 NOTE — Telephone Encounter (Signed)
   Name: Steven Saunders.  DOB: 05/21/1951  MRN: [id number]   Primary Cardiologist: Lewayne Bunting, MD  Chart reviewed as part of pre-operative protocol coverage. Steven Cord. was last seen on 11/28/2022 by Dr. Ladona Ridgel.  Per office visit note: "he is an acceptable surgical risk for upcoming prostate surgery. No additional testing is indicated at this time."  Therefore, based on ACC/AHA guidelines, the patient would be at acceptable risk for the planned procedure without further cardiovascular testing.   Ideally aspirin should be continued without interruption, however if the bleeding risk is too great, aspirin may be held for 5-7 days prior to surgery. Please resume aspirin post operatively when it is felt to be safe from a bleeding standpoint.    I will route this recommendation to the requesting party via Epic fax function and remove from pre-op pool. Please call with questions.  Steven Levering, NP 12/12/2022, 2:09 PM

## 2022-12-12 NOTE — Patient Instructions (Addendum)
Your procedure is scheduled on: Monday 12/22/22 To find out your arrival time, please call [phone] between 1PM - 3PM on:   Friday 12/19/22 Report to the Registration Desk on the 1st floor of the [facility]. Valet parking is available.  If your arrival time is 6:00 am, do not arrive before that time as the [facility] entrance doors do not open until 6:00 am.  REMEMBER: Instructions that are not followed completely may result in serious medical risk, up to and including death; or upon the discretion of your surgeon and anesthesiologist your surgery may need to be rescheduled.  Do not eat food or drink any liquiuds after midnight the night before surgery.  No gum chewing or hard candies.  One week prior to surgery: Stop Anti-inflammatories (NSAIDS) such as Advil, Aleve, Ibuprofen, Motrin, Naproxen, Naprosyn and Aspirin based products such as Excedrin, Goody's Powder, BC Powder. You may however, continue to take Tylenol if needed for pain up until the day of surgery.  Stop ANY OVER THE COUNTER supplements until after surgery.  Continue taking all prescribed medications with the exception of the following: NO sildenafil (VIAGRA) for 2 days before your surgery. Dr Richardo Hanks requesting you hold your aspirin for 7 days starting on 12/15/22  Follow recommendations from Cardiologist or PCP regarding stopping blood thinners. (Aspirin)  TAKE ONLY THESE MEDICATIONS THE MORNING OF SURGERY WITH A SIP OF WATER:  carvedilol (COREG) 25 MG tablet  digoxin (LANOXIN) 0.125 MG tablet  loratadine (CLARITIN) 10 MG tablet  valACYclovir (VALTREX) 1000 MG tablet as needed  No Alcohol for 24 hours before or after surgery.  No Smoking including e-cigarettes for 24 hours before surgery.  No chewable tobacco products for at least 6 hours before surgery.  No nicotine patches on the day of surgery.  Do not use any "recreational" drugs for at least a week (preferably 2 weeks) before your surgery.  Please be  advised that the combination of cocaine and anesthesia may have negative outcomes, up to and including death. If you test positive for cocaine, your surgery will be cancelled.  On the morning of surgery brush your teeth with toothpaste and water, you may rinse your mouth with mouthwash if you wish. Do not swallow any toothpaste or mouthwash.  Use CHG Soap or wipes as directed on instruction sheet.  Do not wear lotions, powders, or perfumes.   Do not shave body hair from the neck down 48 hours before surgery.  Wear comfortable clothing (specific to your surgery type) to the hospital.  Do not wear jewelry, make-up, hairpins, clips or nail polish.  Contact lenses, hearing aids and dentures may not be worn into surgery.  Do not bring valuables to the hospital. [facility] is not responsible for any missing/lost belongings or valuables.   Notify your doctor if there is any change in your medical condition (cold, fever, infection).  If you are being discharged the day of surgery, you will not be allowed to drive home. You will need a responsible individual to drive you home and stay with you for 24 hours after surgery.   If you are taking public transportation, you will need to have a responsible individual with you.  If you are being admitted to the hospital overnight, leave your suitcase in the car. After surgery it may be brought to your room.  In case of increased patient census, it may be necessary for you, the patient, to continue your postoperative care in the Same Day Surgery department.  After surgery, you can help prevent lung complications by doing breathing exercises.  Take deep breaths and cough every 1-2 hours. Your doctor may order a device called an Incentive Spirometer to help you take deep breaths. When coughing or sneezing, hold a pillow firmly against your incision with both hands. This is called "splinting." Doing this helps protect your incision. It also decreases  belly discomfort.  Surgery Visitation Policy:  Patients undergoing a surgery or procedure may have two family members or support persons with them as long as the person is not COVID-19 positive or experiencing its symptoms.   Inpatient Visitation:    Visiting hours are 7 a.m. to 8 p.m. Up to four visitors are allowed at one time in a patient room. The visitors may rotate out with other people during the day. One designated support person (adult) may remain overnight.  Please call the Pre-admissions Testing Dept. at [phone] if you have any questions about these instructions.     Preparing for Surgery with CHLORHEXIDINE GLUCONATE (CHG) Soap  Chlorhexidine Gluconate (CHG) Soap  o An antiseptic cleaner that kills germs and bonds with the skin to continue killing germs even after washing  o Used for showering the night before surgery and morning of surgery  Before surgery, you can play an important role by reducing the number of germs on your skin.  CHG (Chlorhexidine gluconate) soap is an antiseptic cleanser which kills germs and bonds with the skin to continue killing germs even after washing.  Please do not use if you have an allergy to CHG or antibacterial soaps. If your skin becomes reddened/irritated stop using the CHG.  1. Shower the NIGHT BEFORE SURGERY and the MORNING OF SURGERY with CHG soap.  2. If you choose to wash your hair, wash your hair first as usual with your normal shampoo.  3. After shampooing, rinse your hair and body thoroughly to remove the shampoo.  4. Use CHG as you would any other liquid soap. You can apply CHG directly to the skin and wash gently with a scrungie or a clean washcloth.  5. Apply the CHG soap to your body only from the neck down. Do not use on open wounds or open sores. Avoid contact with your eyes, ears, mouth, and genitals (private parts). Wash face and genitals (private parts) with your normal soap.  6. Wash thoroughly, paying  special attention to the area where your surgery will be performed.  7. Thoroughly rinse your body with warm water.  8. Do not shower/wash with your normal soap after using and rinsing off the CHG soap.  9. Pat yourself dry with a clean towel.  10. Wear clean pajamas to bed the night before surgery.  12. Place clean sheets on your bed the night of your first shower and do not sleep with pets.  13. Shower again with the CHG soap on the day of surgery prior to arriving at the hospital.  14. Do not apply any deodorants/lotions/powders.  15. Please wear clean clothes to the hospital.

## 2022-12-12 NOTE — Progress Notes (Signed)
  Phone Number: [phone] for Surgical Coordinator Fax Number: [phone]  REQUEST FOR SURGICAL CLEARANCE       Date: Date: 12/12/22  Faxed to: Dr. Ladona Ridgel, MD  Surgeon: Dr. Legrand Rams, MD     Date of Surgery: 12/22/2022  Operation:  Robotic Laparoscopic Radial Prostatectomy with Bilateral Pelvic Lymph Node Dissection   Anesthesia Type: General   Diagnosis: Prostate Cancer   Patient Requires:   Cardiac / Vascular Clearance : Yes  Reason: Would like for patient to hold 81mg  Aspirin for 7 days.    Risk Assessment:    Low   []       Moderate   []     High   []           This patient is optimized for surgery  YES []       NO   []    I recommend further assessment/workup prior to surgery. YES []      NO  []   Appointment scheduled for: _______________________   Further recommendations: ____________________________________     Physician Signature:__________________________________   Printed Name: ________________________________________   Date: _________________

## 2022-12-12 NOTE — Pre-Procedure Instructions (Signed)
Called pt to let him know that his cardiologist has given the ok to stop his 81 mg 7 days prior to surgery which is what his surgeon Dr Richardo Hanks wanted. Called pt and had to leave a message about last dose being on 12-14-22  per cardiology

## 2022-12-12 NOTE — Telephone Encounter (Signed)
   Pre-operative Risk Assessment    Patient Name: Steven Saunders.  DOB: [birth year] MRN: [id number]      Request for Surgical Clearance    Procedure:   ROBOTIC PROSTATECTOMY WITH PROSTATECTOMY WITH BILATERAL PELVIC LYMPH NODE DISSECTION   Date of Surgery:  Clearance 12/22/22                                 Surgeon:  DR. Legrand Rams Surgeon's Group or Practice Name:  [facility] Phone number:  [phone] Fax number:  [phone]   Type of Clearance Requested:   - Pharmacy:  Hold Aspirin HOLD FOR 7 DAYS   Type of Anesthesia:  General    Additional requests/questions:    SignedMichaelle Copas   12/12/2022, 1:17 PM

## 2022-12-15 ENCOUNTER — Encounter
Admission: RE | Admit: 2022-12-15 | Discharge: 2022-12-15 | Disposition: A | Discharge status: Home / Self Care | Payer: PPO | Source: Ambulatory Visit | Attending: Urology | Admitting: Urology

## 2022-12-15 ENCOUNTER — Encounter: Payer: Self-pay | Admitting: Urgent Care

## 2022-12-15 DIAGNOSIS — Z01812 Encounter for preprocedural laboratory examination: Secondary | ICD-10-CM | POA: Diagnosis not present

## 2022-12-15 DIAGNOSIS — C61 Malignant neoplasm of prostate: Secondary | ICD-10-CM | POA: Insufficient documentation

## 2022-12-15 LAB — COMPREHENSIVE METABOLIC PANEL
ALT: 21 U/L (ref 0–44)
AST: 19 U/L (ref 15–41)
Albumin: 4 g/dL (ref 3.5–5.0)
Alkaline Phosphatase: 58 U/L (ref 38–126)
Anion gap: 8 (ref 5–15)
BUN: 13 mg/dL (ref 8–23)
CO2: 28 mmol/L (ref 22–32)
Calcium: 9.2 mg/dL (ref 8.9–10.3)
Chloride: 95 mmol/L — ABNORMAL LOW (ref 98–111)
Creatinine, Ser: 0.73 mg/dL (ref 0.61–1.24)
GFR, Estimated: >60 mL/min (ref >60)
Glucose, Bld: 140 mg/dL — ABNORMAL HIGH (ref 70–99)
Potassium: 4.6 mmol/L (ref 3.5–5.1)
Sodium: 131 mmol/L — ABNORMAL LOW (ref 135–145)
Total Bilirubin: 1.1 mg/dL (ref 0.3–1.2)
Total Protein: 7.1 g/dL (ref 6.5–8.1)

## 2022-12-15 LAB — PROTIME-INR
INR: 1.1 (ref 0.8–1.2)
Prothrombin Time: 14.1 seconds (ref 11.4–15.2)

## 2022-12-15 LAB — CBC
HCT: 40.8 % (ref 39.0–52.0)
Hemoglobin: 14 g/dL (ref 13.0–17.0)
MCH: 33.4 pg (ref 26.0–34.0)
MCHC: 34.3 g/dL (ref 30.0–36.0)
MCV: 97.4 fL (ref 80.0–100.0)
Platelets: 141 10*3/uL — ABNORMAL LOW (ref 150–400)
RBC: 4.19 MIL/uL — ABNORMAL LOW (ref 4.22–5.81)
RDW: 13 % (ref 11.5–15.5)
WBC: 6 10*3/uL (ref 4.0–10.5)
nRBC: 0 % (ref 0.0–0.2)

## 2022-12-15 LAB — TYPE AND SCREEN
ABO/RH(D): O NEG
Antibody Screen: NEGATIVE

## 2022-12-16 ENCOUNTER — Encounter: Payer: Self-pay | Admitting: Urology

## 2022-12-16 ENCOUNTER — Telehealth: Payer: Self-pay

## 2022-12-16 NOTE — Telephone Encounter (Signed)
Spoke with pt, received cardiac clearance and patient may hold ASA for 5-7 days. Patient verbalized understanding.

## 2022-12-16 NOTE — Progress Notes (Signed)
Perioperative / Anesthesia Services  Pre-Admission Testing Clinical Review / Preoperative Anesthesia Consult  Date: 12/19/22  Patient Demographics:  Name: Steven Saunders. DOB:   02-13-1951 MRN:   [id number]  Planned Surgical Procedure(s):    Case: [id number] Date/Time: 12/22/22 1122   Procedures:      XI ROBOTIC ASSISTED LAPAROSCOPIC RADICAL PROSTATECTOMY     PELVIC LYMPH NODE DISSECTION (Bilateral)   Anesthesia type: General   Pre-op diagnosis: Prostate Cancer   Location: [facility] OR ROOM 07 / [facility]   Surgeons: Sondra Come, MD     NOTE: Available PAT nursing documentation and vital signs have been reviewed. Clinical nursing staff has updated patient's PMH/PSHx, current medication list, and drug allergies/intolerances to ensure comprehensive history available to assist in medical decision making as it pertains to the aforementioned surgical procedure and anticipated anesthetic course. Extensive review of available clinical information personally performed. [place] PMH and PSHx updated with any diagnoses/procedures that  may have been inadvertently omitted during his intake with the pre-admission testing department's nursing staff.  Clinical Discussion:  Steven Seufert. is a 72 y.o. male who is submitted for pre-surgical anesthesia review and clearance prior to him undergoing the above procedure. Patient is a Former Smoker (30 pack years; quit 10/2009). Pertinent PMH includes: CAD, atrial fibrillation, RBBB, aortic atherosclerosis, HTN, HLD, ED (on PDE5i), adenocarcinoma of the prostate.  Patient is followed by cardiology Ladona Ridgel, MD). He was last seen in the cardiology clinic on 11/28/2022; notes reviewed. At the time of his clinic visit, patient doing well overall from a cardiovascular perspective. Patient denied any chest pain, shortness of breath, PND, orthopnea, palpitations, significant peripheral edema, weakness, fatigue, vertiginous symptoms, or  presyncope/syncope. Patient with a past medical history significant for cardiovascular diagnoses. Documented physical exam was grossly benign, providing no evidence of acute exacerbation and/or decompensation of the patient's known cardiovascular conditions.  Patient underwent diagnostic LEFT heart catheterization on 09/19/2005 revealing single-vessel CAD.  There were 30% stenoses in the mid and distal LAD.  Given the nonobstructive nature of his coronary artery disease, there was no clear indication for intervention.  Patient to pursue management of coronary artery disease with medical therapy, in addition to diet lifestyle modification.  Most recent TTE was performed on 02/03/2014 revealing a low normal left ventricular systolic function with EF of 50-55%.  There were no regional wall motion abnormalities.  Right heart enlargement noted; moderate right atrium and mild left ventricle dilation.  There was mild mitral and aortic valve regurgitation.  All transvalvular gradients were noted to be normal providing no evidence suggestive of valvular stenosis.  Patient with an atrial fibrillation diagnosis; CHA2DS2-VASc Score = 3 (age, HTN, vascular disease history). His rate and rhythm are currently being maintained on oral digoxin + carvedilol.  Patient refuses chronic oral anticoagulation despite education regarding increased risk of CVA.  Blood pressure reasonably controlled at 142/80 mmHg on currently prescribed beta-blocker (carvedilol), ARB (irbesartan) and diuretic (spironolactone) therapies.  Patient is on lovastatin for his HLD diagnosis and ASCVD prevention.  Of note, in the setting of known cardiovascular disease, it is important to point out that patient is on a PDE 5 I (sildenafil) for erectile dysfunction diagnosis.  Patient making efforts to maintain an active lifestyle.  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 Functional capacity, as defined by DASI, is documented as being >/= 4 METS.  No  changes were made to his medication regimen.  Patient to follow-up with outpatient  cardiology in 1 year or sooner if needed  Steven Cord. underwent prostate biopsy due to elevated PSA back in 2018, at which time results demonstrated Gleason 6 and 8 of the 12 biopsy cores.  The decision was made to pursue PSA surveillance.  With interval increase in PSA to 6.3 in 2019, biopsy was repeated revealing Gleason 6 and 9 of the 12 biopsy cores; Gleason 7 and 1 core. Surveillance again was recommended.  In 09/2022, PSA with substantial increased to 11.  Biopsy was yet again repeated revealing Gleason 7 (3+4) disease.  Subsequent prostate MRI with signs of extracapsular extension and suspicion for seminal vesicle involvement of the RIGHT base, in addition to LEFT anterolateral transitional zone tracking from the base to the mid gland (PIRADS 5). Patient diagnosed with stage IIIb (cT3b N0 M0).  PSMA PET on 11/03/2022 revealed that RIGHT lateral gland was FDG avid with an SUV max of 13.1. There was no evidence of metastatic disease.  Patient referred to radiation oncology Rushie Chestnut, MD) for consult; notes reviewed.  Plans for surgical resection versus  XRT to the prostate, pelvic lymph nodes, and seminal vesicles were discussed.  If pursuing XRT, patient will require ADT (Eligard) for at least 2-3 years.  Following conversation with radiation oncology, the patient elected to proceed surgical resection.  He has subsequently been scheduled for a XI ROBOTIC ASSISTED LAPAROSCOPIC RADICAL PROSTATECTOMY; PELVIC LYMPH NODE DISSECTION (Bilateral) on 12/22/2022 with Dr. Legrand Rams, MD.    Given patient's past medical history significant for cardiovascular diagnoses, presurgical cardiac clearance was sought by the PAT team. Per cardiology, "based ACC/AHA guidelines, the patient's past medical history, and the amount of time since his last clinic visit, this patient would be at an overall ACCEPTABLE risk for the planned  procedure without further cardiovascular testing or intervention at this time". In review of his medication reconciliation, it is noted that patient is currently on prescribed daily antithrombotic therapy. He has been instructed on recommendations for holding his daily low-dose ASA for 7 days prior to his procedure with plans to restart as soon as postoperative bleeding risk felt to be minimized by his attending surgeon. The patient has been instructed that his last dose of his ASA should be on 12/14/2022.  Patient denies previous perioperative complications with anesthesia in the past. In review of the available records, it is noted that patient underwent a general anesthetic course here at [facility] ([facility]) in 06/2022 without documented complications.      12/12/2022    8:25 AM 11/28/2022    9:33 AM 10/15/2022    1:35 PM  Vitals with BMI  Height 6\' 0"  6\' 0"  6\' 0"   Weight 260 lbs 261 lbs 13 oz 265 lbs  BMI 35.25 35.5 35.93  Systolic  142 152  Diastolic  80 92  Pulse  57 75    Providers/Specialists:   NOTE: Primary physician provider listed below. Patient may have been seen by APP or partner within same practice.   PROVIDER ROLE / SPECIALTY LAST Lise Auer, MD Urology (Surgeon) 10/07/2022  [name], MD Primary Care Provider 05/21/2022  Lewayne Bunting, MD Cardiology/Electrophysiology 11/28/2022  Carmina Miller, MD Radiation Oncology 10/15/2022   Allergies:  Patient has no known allergies.  Current Home Medications:   No current facility-administered medications for this encounter.    acetaminophen (TYLENOL) 500 MG tablet   aspirin EC 81 MG tablet   carvedilol (COREG) 25 MG tablet  Cholecalciferol (VITAMIN D-3) 25 MCG (1000 UT) CAPS   digoxin (LANOXIN) 0.125 MG tablet   docusate sodium (COLACE) 100 MG capsule   glucosamine-chondroitin 500-400 MG tablet   irbesartan (AVAPRO) 150 MG tablet   loratadine (CLARITIN) 10 MG tablet    lovastatin (MEVACOR) 20 MG tablet   Multiple Vitamin (MULTIVITAMIN) tablet   sildenafil (VIAGRA) 100 MG tablet   spironolactone (ALDACTONE) 25 MG tablet   valACYclovir (VALTREX) 1000 MG tablet   History:   Past Medical History:  Diagnosis Date   Adenocarcinoma of prostate    a.) Bx (+) Gleason 6 (8/12 cores) with PSA 5.3 in 2018; b.) Bx (+) 2019 Gleason 6 (9/12 cores) with PSA 6.3; c.) 09/2022 --> stage IIIb (cT3b N0 M0) Gleason 7 (3+4) with  PSA of 11 --> prostate MRI with signs of extracapsular extension and suspicion for seminal vesicle involvement of the right base  --> PSMA PET 11/03/2022 (+) FDG uptake right lateral gland (SUV max 13.1); no metastatic disease.   Aortic atherosclerosis    CAD (coronary artery disease) 09/19/2005   a.) LHC 09/19/2005: 30 mLAD, 30% dLAD - med mgmt.   ED (erectile dysfunction)    a.) on PDE5i (sildenafil)   HLD (hyperlipidemia)    Hypertension    Permanent atrial fibrillation    a.) CHA2DS2VASc = 3 (age, HTN, vascular disease history);  b.) rate/rhythm maintained on oral digoxin + carvedilol; refuses chronically anticoagulation   RBBB    Past Surgical History:  Procedure Laterality Date   APPENDECTOMY     COLONOSCOPY WITH PROPOFOL N/A 05/26/2017   Procedure: COLONOSCOPY WITH PROPOFOL;  Surgeon: Midge Minium, MD;  Location: [facility] ENDOSCOPY;  Service: Endoscopy;  Laterality: N/A;   COLONOSCOPY WITH PROPOFOL N/A 06/17/2022   Procedure: COLONOSCOPY WITH PROPOFOL;  Surgeon: Midge Minium, MD;  Location: [facility] ENDOSCOPY;  Service: Endoscopy;  Laterality: N/A;   HERNIA REPAIR     1950's x 2   JOINT REPLACEMENT Left 2014   LEFT HEART CATH AND CORONARY ANGIOGRAPHY Left 09/19/2005   Procedure: LEFT HEART CATH AND CORONARY ANGIOGRAPHY; Location: [facility]; Surgeon: Charlton Haws, MD   PROSTATE BIOPSY     thumb surgery  1980   had staph infection and had to cut out the area   TOTAL HIP ARTHROPLASTY Left 2014   Dr. Hyacinth Meeker   TOTAL HIP ARTHROPLASTY Right  05/04/2019   Procedure: TOTAL HIP ARTHROPLASTY ANTERIOR APPROACH;  Surgeon: Lyndle Herrlich, MD;  Location: [facility] ORS;  Service: Orthopedics;  Laterality: Right;   Family History  Problem Relation Age of Onset   Hypertension Mother    Heart attack Father    Melanoma Father    Prostate cancer Neg Hx    Bladder Cancer Neg Hx    Kidney cancer Neg Hx    Social History   Tobacco Use   Smoking status: Former    Packs/day: 2.00    Years: 15.00    Additional pack years: 0.00    Total pack years: 30.00    Types: Cigarettes    Quit date: 11/12/2009    Years since quitting: 13.1   Smokeless tobacco: Never   Tobacco comments:    Pt states he quit around 2000?  Vaping Use   Vaping Use: Never used  Substance Use Topics   Alcohol use: Yes    Alcohol/week: 4.0 - 8.0 standard drinks of alcohol    Types: 4 - 8 Cans of beer per week    Comment: 1-2 beers 4xs a week  Drug use: No    Pertinent Clinical Results:  LABS:   No visits with results within 3 Day(s) from this visit.  Latest known visit with results is:  Hospital Outpatient Visit on 12/15/2022  Component Date Value Ref Range Status   Sodium 12/15/2022 131 (L)  135 - 145 mmol/L Final   Potassium 12/15/2022 4.6  3.5 - 5.1 mmol/L Final   Chloride 12/15/2022 95 (L)  98 - 111 mmol/L Final   CO2 12/15/2022 28  22 - 32 mmol/L Final   Glucose, Bld 12/15/2022 140 (H)  70 - 99 mg/dL Final   Glucose reference range applies only to samples taken after fasting for at least 8 hours.   BUN 12/15/2022 13  8 - 23 mg/dL Final   Creatinine, Ser 12/15/2022 0.73  0.61 - 1.24 mg/dL Final   Calcium 40/98/1191 9.2  8.9 - 10.3 mg/dL Final   Total Protein 47/82/9562 7.1  6.5 - 8.1 g/dL Final   Albumin 13/04/6577 4.0  3.5 - 5.0 g/dL Final   AST 46/96/2952 19  15 - 41 U/L Final   ALT 12/15/2022 21  0 - 44 U/L Final   Alkaline Phosphatase 12/15/2022 58  38 - 126 U/L Final   Total Bilirubin 12/15/2022 1.1  0.3 - 1.2 mg/dL Final   GFR, Estimated  12/15/2022 >60  >60 mL/min Final   Comment: (NOTE) Calculated using the CKD-EPI Creatinine Equation (2021)    Anion gap 12/15/2022 8  5 - 15 Final   Performed at [facility], [address] [place], [place] [address]   Prothrombin Time 12/15/2022 14.1  11.4 - 15.2 seconds Final   INR 12/15/2022 1.1  0.8 - 1.2 Final   Comment: (NOTE) INR goal varies based on device and disease states. Performed at [facility], [address] [place], [place] [address]    WBC 12/15/2022 6.0  4.0 - 10.5 K/uL Final   RBC 12/15/2022 4.19 (L)  4.22 - 5.81 MIL/uL Final   Hemoglobin 12/15/2022 14.0  13.0 - 17.0 g/dL Final   HCT 27/25/3664 40.8  39.0 - 52.0 % Final   MCV 12/15/2022 97.4  80.0 - 100.0 fL Final   MCH 12/15/2022 33.4  26.0 - 34.0 pg Final   MCHC 12/15/2022 34.3  30.0 - 36.0 g/dL Final   RDW 40/34/7425 13.0  11.5 - 15.5 % Final   Platelets 12/15/2022 141 (L)  150 - 400 K/uL Final   nRBC 12/15/2022 0.0  0.0 - 0.2 % Final   Performed at [facility], [address] [place], [place] [address]   ABO/RH(D) 12/15/2022 O NEG   Final   Antibody Screen 12/15/2022 NEG   Final   Sample Expiration 12/15/2022 12/29/2022,2359   Final   Extend sample reason 12/15/2022    Final                   Value:NO TRANSFUSIONS OR PREGNANCY IN THE PAST 3 MONTHS Performed at [facility], [address] [place], [place] [address]     ECG: Date: 11/28/2022 Time ECG obtained: 0931 AM Rate: 57 bpm Rhythm:  Atrial fibrillation; RBBB Axis (leads I and aVF): Normal Intervals: QRS 148 ms. QTc 436 ms. ST segment and T wave changes: No evidence of acute ST segment elevation or depression Comparison: Similar to previous tracing obtained on 10/03/2021   IMAGING / PROCEDURES: NM PET (PSMA) SKULL TO MID THIGH performed on 11/03/2022 Focal radiotracer accumulation in right lateral prostate gland, consistent  with primary prostate carcinoma. No evidence of local or distant  metastatic disease. Aortic atherosclerosis  MR PROSTATE W WO CONTRAST performed on 09/30/2022 PIRADS category 5 lesion in the RIGHT anterolateral base to mid gland. PIRADS category 5 lesion in the LEFT anterolateral transitional zone tracking from the base to the mid gland. Long segment capsular abutment and irregular appearance along the capsule with signs of extracapsular extension. Suspicion for seminal vesicle involvement at the RIGHT base. Exam limited by patient motion and susceptibility from the LEFT hip arthroplasty.  TRANSTHORACIC ECHOCARDIOGRAM performed on 02/03/2014 Low normal left ventricular systolic function with an EF of 50-55% No regional wall motion abnormalities Mild LVH Right ventricular size and function normal Severe left atrial enlargement Moderate right atrial enlargement Mild right ventricular enlargement Mild aortic and mitral valve regurgitation Normal gradients; no valvular stenosis  Impression and Plan:  Steven Cord. has been referred for pre-anesthesia review and clearance prior to him undergoing the planned anesthetic and procedural courses. Available labs, pertinent testing, and imaging results were personally reviewed by me in preparation for upcoming operative/procedural course. [facility] medical record has been updated following extensive record review and patient interview with PAT staff.   This patient has been appropriately cleared by cardiology with an overall ACCEPTABLE close it may close it may risk of significant perioperative cardiovascular complications. Based on clinical review performed today (12/19/22), barring any significant acute changes in the patient's overall condition, it is anticipated that he will be able to proceed with the planned surgical intervention. Any acute changes in clinical condition may necessitate his procedure being postponed and/or cancelled. Patient will meet with anesthesia team (MD and/or CRNA) on the day of his  procedure for preoperative evaluation/assessment. Questions regarding anesthetic course will be fielded at that time.   Pre-surgical instructions were reviewed with the patient during his PAT appointment, and questions were fielded to satisfaction by PAT clinical staff. He has been instructed on which medications that he will need to hold prior to surgery, as well as the ones that have been deemed safe/appropriate to take of the day of his procedure. As part of the general education provided by PAT, patient made aware both verbally and in writing, that he would need to abstain from the use of any illegal substances during his perioperative course.  He was advised that failure to follow the provided instructions could necessitate case cancellation or result serious perioperative complications up to and including death. Patient encouraged to contact PAT and/or his surgeon's office to discuss any questions or concerns that may arise prior to surgery; verbalized understanding.   Quentin Mulling, MSN, APRN, FNP-C, CEN [facility]  Peri-operative Services Nurse Practitioner Phone: [phone] Fax: [phone] 12/19/22 11:00 AM  NOTE: This note has been prepared using Dragon dictation software. Despite my best ability to proofread, there is always the potential that unintentional transcriptional errors may still occur from this process.

## 2022-12-19 ENCOUNTER — Encounter: Payer: Self-pay | Admitting: Urology

## 2022-12-22 ENCOUNTER — Encounter: Payer: Self-pay | Admitting: Urology

## 2022-12-22 ENCOUNTER — Other Ambulatory Visit: Payer: Self-pay

## 2022-12-22 ENCOUNTER — Ambulatory Visit: Payer: PPO | Admitting: Urgent Care

## 2022-12-22 ENCOUNTER — Observation Stay
Admission: RE | Admit: 2022-12-22 | Discharge: 2022-12-23 | Disposition: A | Discharge status: Home / Self Care | Payer: PPO | Attending: Urology | Admitting: Urology

## 2022-12-22 ENCOUNTER — Encounter
Admission: RE | Disposition: A | Discharge status: Home / Self Care | Payer: Self-pay | Source: Home / Self Care | Attending: Urology

## 2022-12-22 DIAGNOSIS — I10 Essential (primary) hypertension: Secondary | ICD-10-CM | POA: Diagnosis not present

## 2022-12-22 DIAGNOSIS — I4821 Permanent atrial fibrillation: Secondary | ICD-10-CM | POA: Diagnosis not present

## 2022-12-22 DIAGNOSIS — Z87891 Personal history of nicotine dependence: Secondary | ICD-10-CM | POA: Insufficient documentation

## 2022-12-22 DIAGNOSIS — Z96643 Presence of artificial hip joint, bilateral: Secondary | ICD-10-CM | POA: Diagnosis not present

## 2022-12-22 DIAGNOSIS — C61 Malignant neoplasm of prostate: Secondary | ICD-10-CM | POA: Diagnosis not present

## 2022-12-22 DIAGNOSIS — I4891 Unspecified atrial fibrillation: Secondary | ICD-10-CM | POA: Diagnosis not present

## 2022-12-22 DIAGNOSIS — I2510 Atherosclerotic heart disease of native coronary artery without angina pectoris: Secondary | ICD-10-CM | POA: Insufficient documentation

## 2022-12-22 HISTORY — DX: Malignant neoplasm of prostate: C61

## 2022-12-22 HISTORY — DX: Unspecified right bundle-branch block: I45.10

## 2022-12-22 HISTORY — PX: PELVIC LYMPH NODE DISSECTION: SHX6543

## 2022-12-22 HISTORY — DX: Hyperlipidemia, unspecified: E78.5

## 2022-12-22 HISTORY — DX: Atherosclerosis of aorta: I70.0

## 2022-12-22 HISTORY — DX: Male erectile dysfunction, unspecified: N52.9

## 2022-12-22 HISTORY — PX: ROBOT ASSISTED LAPAROSCOPIC RADICAL PROSTATECTOMY: SHX5141

## 2022-12-22 LAB — CBC
HCT: 40.2 % (ref 39.0–52.0)
Hemoglobin: 13.9 g/dL (ref 13.0–17.0)
MCH: 33.3 pg (ref 26.0–34.0)
MCHC: 34.6 g/dL (ref 30.0–36.0)
MCV: 96.4 fL (ref 80.0–100.0)
Platelets: 131 10*3/uL — ABNORMAL LOW (ref 150–400)
RBC: 4.17 MIL/uL — ABNORMAL LOW (ref 4.22–5.81)
RDW: 12.9 % (ref 11.5–15.5)
WBC: 10.1 10*3/uL (ref 4.0–10.5)
nRBC: 0 % (ref 0.0–0.2)

## 2022-12-22 SURGERY — PROSTATECTOMY, RADICAL, ROBOT-ASSISTED, LAPAROSCOPIC
Anesthesia: General | Site: Prostate

## 2022-12-22 MED ORDER — HYDROCODONE-ACETAMINOPHEN 5-325 MG PO TABS: 1.0000 | ORAL_TABLET | ORAL | Status: DC | PRN

## 2022-12-22 MED ORDER — CHLORHEXIDINE GLUCONATE CLOTH 2 % EX PADS
6.0000 | MEDICATED_PAD | Freq: Once | CUTANEOUS | Status: AC
  Administered 2022-12-22: 6 via TOPICAL

## 2022-12-22 MED ORDER — PROPOFOL 10 MG/ML IV BOLUS
INTRAVENOUS | Status: AC
  Filled 2022-12-22: qty 20

## 2022-12-22 MED ORDER — ROCURONIUM BROMIDE 100 MG/10ML IV SOLN
INTRAVENOUS | Status: DC | PRN
  Administered 2022-12-22 (×2): 20 mg via INTRAVENOUS
  Administered 2022-12-22: 50 mg via INTRAVENOUS

## 2022-12-22 MED ORDER — ONDANSETRON HCL 4 MG/2ML IJ SOLN
INTRAMUSCULAR | Status: AC
  Filled 2022-12-22: qty 2

## 2022-12-22 MED ORDER — HEPARIN SODIUM (PORCINE) 5000 UNIT/ML IJ SOLN
5000.0000 [IU] | INTRAMUSCULAR | Status: AC
  Administered 2022-12-22: 5000 [IU] via SUBCUTANEOUS

## 2022-12-22 MED ORDER — CARVEDILOL 12.5 MG PO TABS
ORAL_TABLET | ORAL | Status: AC
  Filled 2022-12-22: qty 2

## 2022-12-22 MED ORDER — STERILE WATER FOR IRRIGATION IR SOLN
Status: DC | PRN
  Administered 2022-12-22: 3000 mL

## 2022-12-22 MED ORDER — DIGOXIN 125 MCG PO TABS
125.0000 ug | ORAL_TABLET | Freq: Every day | ORAL | Status: DC
  Administered 2022-12-23: 125 ug via ORAL
  Filled 2022-12-22: qty 1

## 2022-12-22 MED ORDER — ACETAMINOPHEN 500 MG PO TABS
1000.0000 mg | ORAL_TABLET | Freq: Four times a day (QID) | ORAL | Status: DC
  Administered 2022-12-22 – 2022-12-23 (×3): 1000 mg via ORAL

## 2022-12-22 MED ORDER — FAMOTIDINE 20 MG PO TABS
20.0000 mg | ORAL_TABLET | Freq: Once | ORAL | Status: AC
  Administered 2022-12-22: 20 mg via ORAL

## 2022-12-22 MED ORDER — BUPIVACAINE HCL 0.5 % IJ SOLN
INTRAMUSCULAR | Status: DC | PRN
  Administered 2022-12-22: 20 mL

## 2022-12-22 MED ORDER — LACTATED RINGERS IV SOLN: INTRAVENOUS | Status: DC | PRN

## 2022-12-22 MED ORDER — HYDROMORPHONE HCL 1 MG/ML IJ SOLN: 0.5000 mg | INTRAMUSCULAR | Status: DC | PRN

## 2022-12-22 MED ORDER — IRBESARTAN 150 MG PO TABS
150.0000 mg | ORAL_TABLET | Freq: Every day | ORAL | Status: DC
  Administered 2022-12-23: 150 mg via ORAL
  Filled 2022-12-22: qty 1

## 2022-12-22 MED ORDER — DEXAMETHASONE SODIUM PHOSPHATE 10 MG/ML IJ SOLN
INTRAMUSCULAR | Status: AC
  Filled 2022-12-22: qty 1

## 2022-12-22 MED ORDER — DROPERIDOL 2.5 MG/ML IJ SOLN: 0.6250 mg | Freq: Once | INTRAMUSCULAR | Status: DC | PRN

## 2022-12-22 MED ORDER — CHLORHEXIDINE GLUCONATE 0.12 % MT SOLN
15.0000 mL | Freq: Once | OROMUCOSAL | Status: AC
  Administered 2022-12-22: 15 mL via OROMUCOSAL

## 2022-12-22 MED ORDER — CEFAZOLIN SODIUM-DEXTROSE 2-4 GM/100ML-% IV SOLN
2.0000 g | INTRAVENOUS | Status: AC
  Administered 2022-12-22: 2 g via INTRAVENOUS

## 2022-12-22 MED ORDER — FENTANYL CITRATE (PF) 100 MCG/2ML IJ SOLN
INTRAMUSCULAR | Status: DC | PRN
  Administered 2022-12-22 (×2): 50 ug via INTRAVENOUS

## 2022-12-22 MED ORDER — OXYCODONE HCL 5 MG PO TABS: 5.0000 mg | ORAL_TABLET | Freq: Once | ORAL | Status: DC | PRN

## 2022-12-22 MED ORDER — ROCURONIUM BROMIDE 10 MG/ML (PF) SYRINGE
PREFILLED_SYRINGE | INTRAVENOUS | Status: AC
  Filled 2022-12-22: qty 10

## 2022-12-22 MED ORDER — SURGIFLO WITH THROMBIN (HEMOSTATIC MATRIX KIT) OPTIME
TOPICAL | Status: DC | PRN
  Administered 2022-12-22: 1 via TOPICAL

## 2022-12-22 MED ORDER — SPIRONOLACTONE 25 MG PO TABS
25.0000 mg | ORAL_TABLET | Freq: Every day | ORAL | Status: DC
  Administered 2022-12-23: 25 mg via ORAL
  Filled 2022-12-22: qty 1

## 2022-12-22 MED ORDER — PROMETHAZINE HCL 25 MG/ML IJ SOLN: 6.2500 mg | INTRAMUSCULAR | Status: DC | PRN

## 2022-12-22 MED ORDER — LACTATED RINGERS IV SOLN: INTRAVENOUS | Status: DC

## 2022-12-22 MED ORDER — SODIUM CHLORIDE 0.45 % IV SOLN: INTRAVENOUS | Status: DC

## 2022-12-22 MED ORDER — ACETAMINOPHEN 500 MG PO TABS
ORAL_TABLET | ORAL | Status: AC
  Filled 2022-12-22: qty 2

## 2022-12-22 MED ORDER — DOCUSATE SODIUM 100 MG PO CAPS
100.0000 mg | ORAL_CAPSULE | Freq: Every day | ORAL | Status: DC
  Administered 2022-12-23: 100 mg via ORAL

## 2022-12-22 MED ORDER — OXYCODONE HCL 5 MG/5ML PO SOLN: 5.0000 mg | Freq: Once | ORAL | Status: DC | PRN

## 2022-12-22 MED ORDER — MIDAZOLAM HCL 2 MG/2ML IJ SOLN
INTRAMUSCULAR | Status: AC
  Filled 2022-12-22: qty 2

## 2022-12-22 MED ORDER — OXYBUTYNIN CHLORIDE 5 MG PO TABS
5.0000 mg | ORAL_TABLET | Freq: Three times a day (TID) | ORAL | Status: DC | PRN
  Administered 2022-12-22 (×2): 5 mg via ORAL

## 2022-12-22 MED ORDER — FENTANYL CITRATE (PF) 100 MCG/2ML IJ SOLN
25.0000 ug | INTRAMUSCULAR | Status: AC | PRN
  Administered 2022-12-22 (×3): 25 ug via INTRAVENOUS
  Administered 2022-12-22: 50 ug via INTRAVENOUS
  Administered 2022-12-22: 25 ug via INTRAVENOUS
  Administered 2022-12-22: 50 ug via INTRAVENOUS

## 2022-12-22 MED ORDER — HEPARIN SODIUM (PORCINE) 5000 UNIT/ML IJ SOLN
5000.0000 [IU] | Freq: Three times a day (TID) | INTRAMUSCULAR | Status: DC
  Administered 2022-12-23: 5000 [IU] via SUBCUTANEOUS

## 2022-12-22 MED ORDER — CARVEDILOL 12.5 MG PO TABS
25.0000 mg | ORAL_TABLET | Freq: Two times a day (BID) | ORAL | Status: DC
  Administered 2022-12-22: 25 mg via ORAL

## 2022-12-22 MED ORDER — PRAVASTATIN SODIUM 10 MG PO TABS
ORAL_TABLET | ORAL | Status: AC
  Filled 2022-12-22: qty 2

## 2022-12-22 MED ORDER — OXYBUTYNIN CHLORIDE 5 MG PO TABS
ORAL_TABLET | ORAL | Status: AC
  Filled 2022-12-22: qty 1

## 2022-12-22 MED ORDER — FAMOTIDINE 20 MG PO TABS
ORAL_TABLET | ORAL | Status: AC
  Filled 2022-12-22: qty 1

## 2022-12-22 MED ORDER — ONDANSETRON HCL 4 MG/2ML IJ SOLN
4.0000 mg | INTRAMUSCULAR | Status: DC | PRN
  Administered 2022-12-23: 4 mg via INTRAVENOUS

## 2022-12-22 MED ORDER — KETAMINE HCL 10 MG/ML IJ SOLN
INTRAMUSCULAR | Status: DC | PRN
  Administered 2022-12-22: 25 mg via INTRAVENOUS

## 2022-12-22 MED ORDER — ACETAMINOPHEN 10 MG/ML IV SOLN
INTRAVENOUS | Status: AC
  Filled 2022-12-22: qty 100

## 2022-12-22 MED ORDER — MIDAZOLAM HCL 2 MG/2ML IJ SOLN
INTRAMUSCULAR | Status: DC | PRN
  Administered 2022-12-22: 2 mg via INTRAVENOUS

## 2022-12-22 MED ORDER — LIDOCAINE HCL (PF) 2 % IJ SOLN
INTRAMUSCULAR | Status: AC
  Filled 2022-12-22: qty 5

## 2022-12-22 MED ORDER — ORAL CARE MOUTH RINSE: 15.0000 mL | Freq: Once | OROMUCOSAL | Status: AC

## 2022-12-22 MED ORDER — FENTANYL CITRATE (PF) 100 MCG/2ML IJ SOLN
INTRAMUSCULAR | Status: AC
  Filled 2022-12-22: qty 2

## 2022-12-22 MED ORDER — BUPIVACAINE HCL (PF) 0.5 % IJ SOLN
INTRAMUSCULAR | Status: AC
  Filled 2022-12-22: qty 30

## 2022-12-22 MED ORDER — DEXAMETHASONE SODIUM PHOSPHATE 10 MG/ML IJ SOLN
INTRAMUSCULAR | Status: DC | PRN
  Administered 2022-12-22: 10 mg via INTRAVENOUS

## 2022-12-22 MED ORDER — ONDANSETRON HCL 4 MG/2ML IJ SOLN
INTRAMUSCULAR | Status: DC | PRN
  Administered 2022-12-22: 4 mg via INTRAVENOUS

## 2022-12-22 MED ORDER — PROPOFOL 10 MG/ML IV BOLUS
INTRAVENOUS | Status: DC | PRN
  Administered 2022-12-22: 20 mg via INTRAVENOUS
  Administered 2022-12-22: 180 mg via INTRAVENOUS

## 2022-12-22 MED ORDER — CHLORHEXIDINE GLUCONATE 0.12 % MT SOLN
OROMUCOSAL | Status: AC
  Filled 2022-12-22: qty 15

## 2022-12-22 MED ORDER — EPHEDRINE SULFATE (PRESSORS) 50 MG/ML IJ SOLN
INTRAMUSCULAR | Status: DC | PRN
  Administered 2022-12-22: 5 mg via INTRAVENOUS

## 2022-12-22 MED ORDER — ACETAMINOPHEN 10 MG/ML IV SOLN: 1000.0000 mg | Freq: Once | INTRAVENOUS | Status: DC | PRN

## 2022-12-22 MED ORDER — LIDOCAINE HCL (CARDIAC) PF 100 MG/5ML IV SOSY
PREFILLED_SYRINGE | INTRAVENOUS | Status: DC | PRN
  Administered 2022-12-22: 100 mg via INTRAVENOUS

## 2022-12-22 MED ORDER — CEFAZOLIN SODIUM-DEXTROSE 2-4 GM/100ML-% IV SOLN
INTRAVENOUS | Status: AC
  Filled 2022-12-22: qty 100

## 2022-12-22 MED ORDER — HEPARIN SODIUM (PORCINE) 5000 UNIT/ML IJ SOLN
INTRAMUSCULAR | Status: AC
  Filled 2022-12-22: qty 1

## 2022-12-22 MED ORDER — SUGAMMADEX SODIUM 200 MG/2ML IV SOLN
INTRAVENOUS | Status: DC | PRN
  Administered 2022-12-22: 235.8 mg via INTRAVENOUS

## 2022-12-22 MED ORDER — ACETAMINOPHEN 10 MG/ML IV SOLN
INTRAVENOUS | Status: DC | PRN
  Administered 2022-12-22: 1000 mg via INTRAVENOUS

## 2022-12-22 MED ORDER — KETAMINE HCL 50 MG/5ML IJ SOSY
PREFILLED_SYRINGE | INTRAMUSCULAR | Status: AC
  Filled 2022-12-22: qty 5

## 2022-12-22 MED ORDER — PRAVASTATIN SODIUM 10 MG PO TABS
20.0000 mg | ORAL_TABLET | Freq: Every day | ORAL | Status: DC
  Administered 2022-12-22: 20 mg via ORAL

## 2022-12-22 MED ORDER — TRIPLE ANTIBIOTIC 3.5-400-5000 EX OINT: 1.0000 | TOPICAL_OINTMENT | Freq: Three times a day (TID) | CUTANEOUS | Status: DC | PRN

## 2022-12-22 SURGICAL SUPPLY — 96 items
ADH SKN CLS APL DERMABOND .7 (GAUZE/BANDAGES/DRESSINGS) ×2
AGENT HMST KT MTR STRL THRMB (HEMOSTASIS) ×2
ANCHOR TIS RET SYS 235ML (MISCELLANEOUS) ×2 IMPLANT
APL ESCP 34 STRL LF DISP (HEMOSTASIS) ×2
APL PRP STRL LF DISP 70% ISPRP (MISCELLANEOUS) ×4
APPLICATOR SURGIFLO ENDO (HEMOSTASIS) ×2 IMPLANT
BAG DRN RND TRDRP ANRFLXCHMBR (UROLOGICAL SUPPLIES) ×2
BAG PRESSURE INF REUSE 1000 (BAG) IMPLANT
BAG TISS RTRVL C235 10X14 (MISCELLANEOUS) ×2
BAG URINE DRAIN 2000ML AR STRL (UROLOGICAL SUPPLIES) ×2 IMPLANT
BLADE CLIPPER SURG (BLADE) ×2 IMPLANT
BULB RESERV EVAC DRAIN JP 100C (MISCELLANEOUS) IMPLANT
CATH FOL 2WAY LX 18X5 (CATHETERS) ×2 IMPLANT
CATH FOL 2WAY LX 20X5 (CATHETERS) IMPLANT
CHLORAPREP W/TINT 26 (MISCELLANEOUS) ×4 IMPLANT
CLIP LIGATING HEM O LOK PURPLE (MISCELLANEOUS) ×6 IMPLANT
COVER TIP SHEARS 8 DVNC (MISCELLANEOUS) ×2 IMPLANT
DERMABOND ADVANCED .7 DNX12 (GAUZE/BANDAGES/DRESSINGS) ×2 IMPLANT
DRAIN CHANNEL JP 15F RND 16 (MISCELLANEOUS) IMPLANT
DRAIN CHANNEL JP 19F (MISCELLANEOUS) IMPLANT
DRAPE 3/4 80X56 (DRAPES) ×2 IMPLANT
DRAPE ARM DVNC X/XI (DISPOSABLE) ×8 IMPLANT
DRAPE COLUMN DVNC XI (DISPOSABLE) ×2 IMPLANT
DRAPE LEGGINS SURG 28X43 STRL (DRAPES) ×2 IMPLANT
DRAPE SURG 17X11 SM STRL (DRAPES) ×8 IMPLANT
DRAPE UNDER BUTTOCK W/FLU (DRAPES) ×2 IMPLANT
DRIVER NDL LRG 8 DVNC XI (INSTRUMENTS) ×2 IMPLANT
DRIVER NDLE LRG 8 DVNC XI (INSTRUMENTS) ×2 IMPLANT
DRSG TELFA 3X8 NADH STRL (GAUZE/BANDAGES/DRESSINGS) ×2 IMPLANT
ELECT REM PT RETURN 9FT ADLT (ELECTROSURGICAL) ×2
ELECTRODE REM PT RTRN 9FT ADLT (ELECTROSURGICAL) ×2 IMPLANT
FORCEPS BPLR 8 MD DVNC XI (FORCEP) ×2 IMPLANT
FORCEPS BPLR FENES DVNC XI (FORCEP) ×2 IMPLANT
FORCEPS PROGRASP DVNC XI (FORCEP) ×2 IMPLANT
GLOVE BIOGEL PI IND STRL 7.5 (GLOVE) ×6 IMPLANT
GOWN STRL REUS W/ TWL LRG LVL3 (GOWN DISPOSABLE) ×4 IMPLANT
GOWN STRL REUS W/ TWL XL LVL3 (GOWN DISPOSABLE) ×4 IMPLANT
GOWN STRL REUS W/TWL LRG LVL3 (GOWN DISPOSABLE) ×4
GOWN STRL REUS W/TWL XL LVL3 (GOWN DISPOSABLE) ×4
GRASPER SUT TROCAR 14GX15 (MISCELLANEOUS) ×2 IMPLANT
HEMOSTAT SURGICEL 2X14 (HEMOSTASIS) IMPLANT
HOLDER FOLEY CATH W/STRAP (MISCELLANEOUS) ×2 IMPLANT
IRRIGATION STRYKERFLOW (MISCELLANEOUS) ×2 IMPLANT
IRRIGATOR STRYKERFLOW (MISCELLANEOUS) ×2
KIT PINK PAD W/HEAD ARE REST (MISCELLANEOUS) ×2
KIT PINK PAD W/HEAD ARM REST (MISCELLANEOUS) ×2 IMPLANT
LABEL OR SOLS (LABEL) ×2 IMPLANT
MANIFOLD NEPTUNE II (INSTRUMENTS) ×2 IMPLANT
NDL HYPO 22X1.5 SAFETY MO (MISCELLANEOUS) ×2 IMPLANT
NDL INSUFFLATION 14GA 120MM (NEEDLE) ×2 IMPLANT
NEEDLE HYPO 22X1.5 SAFETY MO (MISCELLANEOUS) ×2 IMPLANT
NEEDLE INSUFFLATION 14GA 120MM (NEEDLE) ×2 IMPLANT
NS IRRIG 500ML POUR BTL (IV SOLUTION) ×2 IMPLANT
OBTURATOR OPTICAL STND 8 DVNC (TROCAR) ×2
OBTURATOR OPTICALSTD 8 DVNC (TROCAR) ×2 IMPLANT
PACK LAP CHOLECYSTECTOMY (MISCELLANEOUS) ×2 IMPLANT
PENCIL SMOKE EVACUATOR (MISCELLANEOUS) ×2 IMPLANT
RELOAD STAPLE 60 2.6 WHT THN (STAPLE) IMPLANT
RELOAD STAPLER WHITE 60MM (STAPLE) ×2 IMPLANT
SCISSORS MNPLR CVD DVNC XI (INSTRUMENTS) ×2 IMPLANT
SEAL UNIV 5-12 XI (MISCELLANEOUS) ×8 IMPLANT
SET TUBE SMOKE EVAC HIGH FLOW (TUBING) ×2 IMPLANT
SLEEVE PROTECTION STRL DISP (MISCELLANEOUS) IMPLANT
SOL ELECTROSURG ANTI STICK (MISCELLANEOUS) ×2
SOLUTION ELECTROSURG ANTI STCK (MISCELLANEOUS) ×2 IMPLANT
SPONGE T-LAP 4X18 ~~LOC~~+RFID (SPONGE) ×2 IMPLANT
SPONGE VERSALON 4X4 4PLY (MISCELLANEOUS) IMPLANT
STAPLE ECHEON FLEX 60 POW ENDO (STAPLE) IMPLANT
STAPLER RELOAD WHITE 60MM (STAPLE) ×2
STAPLER SKIN PROX 35W (STAPLE) ×2 IMPLANT
STRAP SAFETY 5IN WIDE (MISCELLANEOUS) ×2 IMPLANT
SURGIFLO W/THROMBIN 8M KIT (HEMOSTASIS) ×2 IMPLANT
SURGILUBE 2OZ TUBE FLIPTOP (MISCELLANEOUS) ×2 IMPLANT
SUT DVC VLOC 90 3-0 CV23 UNDY (SUTURE) ×4 IMPLANT
SUT DVC VLOC 90 3-0 CV23 VLT (SUTURE) ×2
SUT ETHILON 3-0 FS-10 30 BLK (SUTURE)
SUT MNCRL 4-0 (SUTURE) ×4
SUT MNCRL 4-0 27XMFL (SUTURE) ×4
SUT PROLENE 5 0 RB 1 DA (SUTURE) IMPLANT
SUT SILK 2 0 SH (SUTURE) IMPLANT
SUT VIC AB 0 CT1 36 (SUTURE) ×4 IMPLANT
SUT VIC AB 2-0 CT1 (SUTURE) IMPLANT
SUT VICRYL 0 AB UR-6 (SUTURE) IMPLANT
SUT VICRYL 0 UR6 27IN ABS (SUTURE) ×2 IMPLANT
SUTURE DVC VLC 90 3-0 CV23 VLT (SUTURE) ×2 IMPLANT
SUTURE EHLN 3-0 FS-10 30 BLK (SUTURE) IMPLANT
SUTURE MNCRL 4-0 27XMF (SUTURE) ×4 IMPLANT
SYR TOOMEY 50ML (SYRINGE) IMPLANT
SYR TOOMEY IRRIG 70ML (MISCELLANEOUS) ×2
SYRINGE TOOMEY IRRIG 70ML (MISCELLANEOUS) ×2 IMPLANT
TAPE CLOTH 3X10 WHT NS LF (GAUZE/BANDAGES/DRESSINGS) ×4 IMPLANT
TRAP FLUID SMOKE EVACUATOR (MISCELLANEOUS) ×2 IMPLANT
TROCAR XCEL 12X100 BLDLESS (ENDOMECHANICALS) ×2 IMPLANT
TROCAR Z-THREAD FIOS 5X100MM (TROCAR) ×2 IMPLANT
WATER STERILE IRR 3000ML UROMA (IV SOLUTION) ×2 IMPLANT
WATER STERILE IRR 500ML POUR (IV SOLUTION) ×2 IMPLANT

## 2022-12-22 NOTE — Anesthesia Procedure Notes (Signed)
Procedure Name: Intubation Date/Time: 12/22/2022 11:07 AM  Performed by: Garvin Ellena, [place], CRNAPre-anesthesia Checklist: Patient identified, Patient being monitored, Timeout performed, Emergency Drugs available and Suction available Patient Re-evaluated:Patient Re-evaluated prior to induction Oxygen Delivery Method: Circle system utilized Preoxygenation: Pre-oxygenation with 100% oxygen Induction Type: IV induction Ventilation: Mask ventilation without difficulty Laryngoscope Size: McGraph and 4 Grade View: Grade I Tube type: Oral Tube size: 7.5 mm Number of attempts: 1 Airway Equipment and Method: Stylet Placement Confirmation: ETT inserted through vocal cords under direct vision, positive ETCO2 and breath sounds checked- equal and bilateral Secured at: 23 cm Tube secured with: Tape Dental Injury: Teeth and Oropharynx as per pre-operative assessment

## 2022-12-22 NOTE — Transfer of Care (Signed)
Immediate Anesthesia Transfer of Care Note  Patient: Steven Saunders.  Procedure(s) Performed: XI ROBOTIC ASSISTED LAPAROSCOPIC RADICAL PROSTATECTOMY (Prostate) PELVIC LYMPH NODE DISSECTION (Bilateral: Prostate)  Patient Location: PACU  Anesthesia Type:General  Level of Consciousness: awake, alert , and oriented  Airway & Oxygen Therapy: Patient Spontanous Breathing  Post-op Assessment: Report given to RN and Post -op Vital signs reviewed and stable  Post vital signs: Reviewed and stable  Last Vitals:  Vitals Value Taken Time  BP 143/90 12/22/22 1430  Temp 36.7 C 12/22/22 1430  Pulse 59 12/22/22 1432  Resp 10 12/22/22 1433  SpO2 100 % 12/22/22 1432  Vitals shown include unvalidated device data.  Last Pain:  Vitals:   12/22/22 0920  TempSrc: Temporal  PainSc: 0-No pain         Complications: No notable events documented.

## 2022-12-22 NOTE — Op Note (Signed)
Date of procedure: 12/22/2022   Preoperative diagnosis:  Intermediate risk prostate cancer   Postoperative diagnosis:  Same   Procedure: Da Vinci robotic radical prostatectomy Bilateral pelvic lymph node dissection   Surgeon: Legrand Rams, MD   Assistant: Vanna Scotland, MD  (An assistant was required for this surgical procedure.  The duties of the assistant included but were not limited to suctioning, passing suture, camera manipulation, retraction. This procedure would not be able to be performed without an assistant)   Anesthesia: General   Complications: None   Intraoperative findings:  1.  Uncomplicated robotic prostatectomy and pelvic LN dissection 2.  Watertight vesicourethral anastomosis   EBL: 150 cc   Specimens:  1.  Prostate and seminal vesicles 2. Right pelvic lymph nodes 3. Left pelvic lymph nodes   Drains: 20 French 2-way Foley   Indication: Steven Saunders is a 72 y.o. male with intermediate risk prostate cancer.  We discussed treatment options including surgery or radiation, and he elected for robotic prostatectomy.  After reviewing the management options for treatment, they elected to proceed with the above surgical procedure(s). We have discussed the potential benefits and risks of the procedure, side effects of the proposed treatment, the likelihood of the patient achieving the goals of the procedure, and any potential problems that might occur during the procedure or recuperation. Informed consent has been obtained. We specifically discussed the risks of bleeding, infection, bowel injury, injury to surrounding structures, incontinence, erectile dysfunction, urine leak, possible need for prolonged foley or drain placement, need for long term PSA monitoring, risk of recurrence of disease, and possible need for salvage radiation in the future.   Description of procedure:   The patient was taken to the operating room and general anesthesia was induced. SCDs were  placed for DVT prophylaxis, and 5000 units subcutaneous heparin were given.  The patient was placed in the dorsal lithotomy position, carefully padded with the arms against the sides, prepped and draped in the usual sterile fashion, and preoperative antibiotics(Ancef) were administered. A preoperative time-out was performed.    On the field, an 32 [place] Foley catheter passed easily into the bladder with return of clear yellow urine.  An 8 mm incision was made above the umbilicus, and a Veress needle was used to obtain access to the peritoneum.  The abdomen was insufflated to 15 mmHg, and the robotic ports were placed in standard fashion under direct vision. A Carter-Thomason device was used to pre-place an 0-vicryl suture in the right lateral 11mm assistant port under direct vision. Thorough inspection of the abdomen showed no bowel injury or other abnormal findings.  Lidocaine was injected into all port sites prior to placement.  The patient was then placed in steep Trendelenburg position and the robot was docked.   I started by entering the space of Retzius and taking down the bladder.  Once this was free to the vas deferens bilaterally, the periprostatic fat was cleaned off the prostate and sent for permanent.  The endopelvic fascia was opened bilaterally, and muscle fibers spared laterally.  The periprostatic ligaments were carefully taken down.  The DVC was isolated, and ligated using a 60 mm load vascular stapler.    I then turned my attention to the anterior bladder neck, and this was incised down to the level of the Foley catheter.  The right side of the bladder neck was thin.  The Foley catheter was removed from the bladder and gently held upward for traction.  The posterior bladder neck  was carefully divided, and we identified the plane between the prostate and the seminal vesicles.  The ampulla of the vas was divided bilaterally, and held up for retraction.  The seminal vesicles were then dissected  out bilaterally intact.  [place] fascia was opened posteriorly to the prostate and freed laterally. The pedicles were taken down using weck clips, and cautery was minimized. An extensive nerve spare was not performed based on his PI-RADS 5 lesion at the base and concern for possible SV involvement. The prostate was then gently retracted superiorly, and the apex was dissected free.  Once the urethra was identified, the use of electrocautery was minimized.  The urethra was sharply entered and the Foley catheter removed.  The posterior urethral plate was divided. The specimen was placed in an Endo Catch bag and moved out of the field.  There was excellent hemostasis in the pelvis.   A lymph node dissection was then performed. I started on the right side and removed the lymphatic tissue in the obturator fossa with the borders of the external iliac vein laterally, node of [place] distally, the bladder medially, and the obturator nerve. An identical procedure was performed on the left side. Weck clips were used to clip visualized lymphatics.   A 3-0 V lock stitch was used to reapproximate the tissue behind the bladder and posteriorly to the urethra as described by Rocco in order to remove tension from the anastomosis.  Two 3-0 V lock stitches were connected and used for the urethrovesical anastomosis.  A running anastomosis was performed circumferentially and there appeared to be good approximation of the mucosa on both sides.  A new 20 French Foley passed easily into the bladder. With the sutures tied anteriorly and 12cc in the balloon, 120 cc normal saline were instilled with no leakage noted in the operative field.  A anterior urethral suspension was performed by passing the sutures through the posterior aspect of the pubic bone with gentle tension to aid in return to continence.  I opted to leave the drain with the thin bladder neck on the right side.  There was excellent hemostasis.  A small piece of  Surgicel was placed on either side of the anastomosis along the endopelvic fascia in addition to Surgi-Flo, as well as in the obturator fossa bilaterally. All instruments were removed and the robot was undocked.   The supraumbilical incision was extended superiorly, and the specimen was removed.  The pre-placed fascial suture in the 11mm right lateral assistant port was tied down. The fascia of the small midline incision was closed using a running 0 Vicryl.  All port sites were copiously irrigated.  There was good hemostasis.  A running 4-0 Monocryl was used to close all incisions, and Dermabond was applied.   All counts were correct, and the patient was awakened and taken to PACU in stable condition.   Disposition: Stable to PACU   Plan: Anticipate discharge home tomorrow with catheter in place for 1 week  Legrand Rams, MD 12/22/2022

## 2022-12-22 NOTE — H&P (Signed)
12/22/22 10:29 AM   Steven Cord. 20-Jul-1951 [id number]  CC: Prostate cancer  HPI: He is a 72 year old male with a cardiac history, mild lower urinary tract symptoms on BPH(now off flomax), and erectile dysfunction on PDE5i.  .   He originally underwent a prostate biopsy for an elevated PSA of 7.3 with Dr. Sherryl Barters in November 2018.  This showed low risk disease with Gleason score 3+3=6 in 8/12 cores, with max core involvement of 37%.  He elected for active surveillance with confirmatory biopsy.  He underwent a confirmatory biopsy with myself 07/20/2018, with PSA of 6.3.  This showed primarily Gleason score 3+3=6 in 9/12 cores, however there was a single core of Gleason score 3+4=7 with grade 4 comprising 10% of the tumor.  Max core involvement overall was 40%.  Prostate volume was 51 g, and PSA density was 0.12.   After an extensive conversation and shared decision making, he opted for active surveillance.   PSA had continued to slowly rise over the last few years, most recently 11.0 on 09/05/2022.  He remains fairly adamant about remaining on active surveillance, but using shared decision making he was amenable to a prostate MRI.  DRE has been limited by body habitus.   I personally viewed and interpreted the prostate MRI dated 09/30/2022 that shows a 49g prostate with PI-RADS category 5 lesion in the right anterior lateral base and left anterior lateral transition zone, as well as some irregular appearance along the capsule concerning for possible extracapsular extension and possible SV involvement at the right base.   We reviewed these concerning MRI findings, and I strongly recommended considering definitive treatment at this point with either surgery or radiation.  Other alternatives would be a repeat biopsy, but high suspicion this would continue to show intermediate or high risk disease, and would not change our treatment recommendations.   We discussed the roles of active  surveillance, radiation therapy, surgical therapy with robotic prostatectomy, and hormone therapy with androgen deprivation.  We discussed that patients urinary symptoms also impact treatment strategy, as patients with severe lower urinary tract symptoms may have significant worsening or even develop urinary retention after undergoing radiation.  He opted for surgery.     PMH: Past Medical History:  Diagnosis Date   Adenocarcinoma of prostate    a.) Bx (+) Gleason 6 (8/12 cores) with PSA 5.3 in 2018; b.) Bx (+) 2019 Gleason 6 (9/12 cores) with PSA 6.3; c.) 09/2022 --> stage IIIb (cT3b N0 M0) Gleason 7 (3+4) with  PSA of 11 --> prostate MRI with signs of extracapsular extension and suspicion for seminal vesicle involvement of the right base  --> PSMA PET 11/03/2022 (+) FDG uptake right lateral gland (SUV max 13.1); no metastatic disease.   Aortic atherosclerosis    CAD (coronary artery disease) 09/19/2005   a.) LHC 09/19/2005: 30 mLAD, 30% dLAD - med mgmt.   ED (erectile dysfunction)    a.) on PDE5i (sildenafil)   HLD (hyperlipidemia)    Hypertension    Permanent atrial fibrillation    a.) CHA2DS2VASc = 3 (age, HTN, vascular disease history);  b.) rate/rhythm maintained on oral digoxin + carvedilol; refuses chronically anticoagulation   RBBB     Surgical History: Past Surgical History:  Procedure Laterality Date   APPENDECTOMY     COLONOSCOPY WITH PROPOFOL N/A 05/26/2017   Procedure: COLONOSCOPY WITH PROPOFOL;  Surgeon: Midge Minium, MD;  Location: [facility] ENDOSCOPY;  Service: Endoscopy;  Laterality: N/A;   COLONOSCOPY WITH PROPOFOL  N/A 06/17/2022   Procedure: COLONOSCOPY WITH PROPOFOL;  Surgeon: Midge Minium, MD;  Location: [facility] ENDOSCOPY;  Service: Endoscopy;  Laterality: N/A;   HERNIA REPAIR     1950's x 2   JOINT REPLACEMENT Left 2014   LEFT HEART CATH AND CORONARY ANGIOGRAPHY Left 09/19/2005   Procedure: LEFT HEART CATH AND CORONARY ANGIOGRAPHY; Location: [facility]; Surgeon:  Charlton Haws, MD   PROSTATE BIOPSY     thumb surgery  1980   had staph infection and had to cut out the area   TOTAL HIP ARTHROPLASTY Left 2014   Dr. Hyacinth Meeker   TOTAL HIP ARTHROPLASTY Right 05/04/2019   Procedure: TOTAL HIP ARTHROPLASTY ANTERIOR APPROACH;  Surgeon: Lyndle Herrlich, MD;  Location: [facility] ORS;  Service: Orthopedics;  Laterality: Right;     Family History: Family History  Problem Relation Age of Onset   Hypertension Mother    Heart attack Father    Melanoma Father    Prostate cancer Neg Hx    Bladder Cancer Neg Hx    Kidney cancer Neg Hx     Social History:  reports that he quit smoking about 13 years ago. His smoking use included cigarettes. He has a 30.00 pack-year smoking history. He has never used smokeless tobacco. He reports current alcohol use of about 4.0 - 8.0 standard drinks of alcohol per week. He reports that he does not use drugs.  Physical Exam: BP (!) 157/89 (BP Location: Right Arm)   Pulse (!) 56   Temp 97.8 F (36.6 C) (Temporal)   Resp 18   SpO2 99%    Constitutional:  Alert and oriented, No acute distress. Cardiovascular: Regular rate and rhythm Respiratory: Clear to auscultation bilaterally GI: Abdomen is soft, nontender, nondistended, no abdominal masses   Assessment & Plan:   72 year old male with intermediate risk prostate cancer, abnormal prostate MRI who opted for robotic radical prostatectomy with bilateral pelvic lymph node dissection.  In regards to surgery, we discussed robotic prostatectomy +/- lymphadenectomy at length.  The procedure takes 3 to 4 hours, and patient's typically discharge home on post-op day #1.  A Foley catheter is left in place for 7 to 10 days to allow for healing of the vesicourethral anastomosis.  There is a small risk of bleeding, infection, damage to surrounding structures or bowel, hernia, DVT/PE, or serious cardiac or pulmonary complications.  We discussed at length post-op side effects including erectile  dysfunction, and the importance of pre-operative erectile function on long-term outcomes.  Even with a nerve sparing approach, there is an approximately 25% rate of permanent erectile dysfunction.  We also discussed postop urinary incontinence at length.  We expect patients to have stress incontinence post-operatively that will improve over period of weeks to months.  Less than 10% of men will require a pad at 1 year after surgery.  Patients will need to avoid heavy lifting and strenuous activity for 3 to 4 weeks, but most men return to their baseline activity status by 6 weeks.  Robotic radical prostatectomy and bilateral pelvic lymph node dissection today  Legrand Rams, MD 12/22/2022  [facility] [address] [place], [place] [address] [phone]

## 2022-12-22 NOTE — Anesthesia Preprocedure Evaluation (Addendum)
Anesthesia Evaluation  Patient identified by MRN, date of birth, ID band Patient awake    Reviewed: Allergy & Precautions, NPO status , Patient's Chart, lab work & pertinent test results  History of Anesthesia Complications Negative for: history of anesthetic complications  Airway Mallampati: III   Neck ROM: Full    Dental  (+) Missing   Pulmonary former smoker   Pulmonary exam normal breath sounds clear to auscultation       Cardiovascular Exercise Tolerance: Good hypertension, Pt. on home beta blockers and Pt. on medications (-) angina + CAD  + dysrhythmias (a fib) Atrial Fibrillation  Rhythm:Irregular Rate:Normal - Systolic murmurs ECG 10/03/21: A fib, RBBB LHC 09/19/2005: 30 mLAD, 30% dLAD - med mgmt.   Neuro/Psych neuropathy  Neuromuscular disease    GI/Hepatic negative GI ROS,,,  Endo/Other  Obesity   Renal/GU negative Renal ROS   Prostate CA    Musculoskeletal   Abdominal Normal abdominal exam  (+)   Peds  Hematology thrombocytopenia   Anesthesia Other Findings Pertinent PMH includes: CAD, atrial fibrillation, RBBB, aortic atherosclerosis, HTN, HLD, ED (on PDE5i), adenocarcinoma of the prostate.  Cardiology note 1. Atrial fib - his VR is well controlled. He is asymptomatic. He still refuses to take an OAC or warfarin. 2. HTN -his bp is controlled. 3. Obesity - we discussed the improtance of weight loss. I discussed fasting again. 4. Dyslipidemia - he will continue his statin.  Reproductive/Obstetrics                             Anesthesia Physical Anesthesia Plan  ASA: 3  Anesthesia Plan: General   Post-op Pain Management: Toradol IV (intra-op)* and Ofirmev IV (intra-op)*   Induction: Intravenous  PONV Risk Score and Plan: 2 and Dexamethasone and Ondansetron  Airway Management Planned: Oral ETT  Additional Equipment:   Intra-op Plan:   Post-operative Plan:  Extubation in OR  Informed Consent: I have reviewed the patients History and Physical, chart, labs and discussed the procedure including the risks, benefits and alternatives for the proposed anesthesia with the patient or authorized representative who has indicated his/her understanding and acceptance.     Dental advisory given  Plan Discussed with: CRNA and Anesthesiologist  Anesthesia Plan Comments: (LMA/GETA backup discussed.  Patient consented for risks of anesthesia including but not limited to:  - adverse reactions to medications - damage to eyes, teeth, lips or other oral mucosa - nerve damage due to positioning  - sore throat or hoarseness - damage to heart, brain, nerves, lungs, other parts of body or loss of life  Informed patient about role of CRNA in peri- and intra-operative care.  Patient voiced understanding.)        Anesthesia Quick Evaluation

## 2022-12-23 ENCOUNTER — Encounter: Payer: Self-pay | Admitting: Urology

## 2022-12-23 DIAGNOSIS — C61 Malignant neoplasm of prostate: Secondary | ICD-10-CM

## 2022-12-23 DIAGNOSIS — N4231 Prostatic intraepithelial neoplasia: Secondary | ICD-10-CM

## 2022-12-23 LAB — CBC
HCT: 35.9 % — ABNORMAL LOW (ref 39.0–52.0)
Hemoglobin: 12.6 g/dL — ABNORMAL LOW (ref 13.0–17.0)
MCH: 33.2 pg (ref 26.0–34.0)
MCHC: 35.1 g/dL (ref 30.0–36.0)
MCV: 94.5 fL (ref 80.0–100.0)
Platelets: 141 10*3/uL — ABNORMAL LOW (ref 150–400)
RBC: 3.8 MIL/uL — ABNORMAL LOW (ref 4.22–5.81)
RDW: 12.8 % (ref 11.5–15.5)
WBC: 10.8 10*3/uL — ABNORMAL HIGH (ref 4.0–10.5)
nRBC: 0 % (ref 0.0–0.2)

## 2022-12-23 LAB — BASIC METABOLIC PANEL
Anion gap: 7 (ref 5–15)
BUN: 15 mg/dL (ref 8–23)
CO2: 23 mmol/L (ref 22–32)
Calcium: 8.3 mg/dL — ABNORMAL LOW (ref 8.9–10.3)
Chloride: 96 mmol/L — ABNORMAL LOW (ref 98–111)
Creatinine, Ser: 0.74 mg/dL (ref 0.61–1.24)
GFR, Estimated: >60 mL/min (ref >60)
Glucose, Bld: 179 mg/dL — ABNORMAL HIGH (ref 70–99)
Potassium: 3.9 mmol/L (ref 3.5–5.1)
Sodium: 126 mmol/L — ABNORMAL LOW (ref 135–145)

## 2022-12-23 MED ORDER — OXYBUTYNIN CHLORIDE 5 MG PO TABS: 5.0000 mg | ORAL_TABLET | Freq: Three times a day (TID) | ORAL | 0 refills | Status: DC | PRN

## 2022-12-23 MED ORDER — ACETAMINOPHEN 500 MG PO TABS
ORAL_TABLET | ORAL | Status: AC
  Filled 2022-12-23: qty 2

## 2022-12-23 MED ORDER — HYDROCODONE-ACETAMINOPHEN 5-325 MG PO TABS: 1.0000 | ORAL_TABLET | Freq: Four times a day (QID) | ORAL | 0 refills | Status: DC | PRN

## 2022-12-23 MED ORDER — ONDANSETRON HCL 4 MG/2ML IJ SOLN
INTRAMUSCULAR | Status: AC
  Filled 2022-12-23: qty 2

## 2022-12-23 MED ORDER — HEPARIN SODIUM (PORCINE) 5000 UNIT/ML IJ SOLN
INTRAMUSCULAR | Status: AC
  Filled 2022-12-23: qty 1

## 2022-12-23 MED ORDER — DOCUSATE SODIUM 100 MG PO CAPS
ORAL_CAPSULE | ORAL | Status: AC
  Filled 2022-12-23: qty 1

## 2022-12-23 NOTE — Anesthesia Postprocedure Evaluation (Signed)
Anesthesia Post Note  Patient: Steven Saunders.  Procedure(s) Performed: XI ROBOTIC ASSISTED LAPAROSCOPIC RADICAL PROSTATECTOMY (Prostate) PELVIC LYMPH NODE DISSECTION (Bilateral: Prostate)  Patient location during evaluation: PACU Anesthesia Type: General Level of consciousness: awake and alert Pain management: pain level controlled Vital Signs Assessment: post-procedure vital signs reviewed and stable Respiratory status: spontaneous breathing, nonlabored ventilation, respiratory function stable and patient connected to nasal cannula oxygen Cardiovascular status: blood pressure returned to baseline and stable Postop Assessment: no apparent nausea or vomiting Anesthetic complications: no   No notable events documented.   Last Vitals:  Vitals:   12/23/22 0037 12/23/22 0600  BP: (!) 158/84 135/80  Pulse: 66 (!) 52  Resp: 17 16  Temp: 37.3 C 36.7 C  SpO2: 96% 97%    Last Pain:  Vitals:   12/23/22 0600  TempSrc: Temporal  PainSc:                  Cleda Mccreedy Kateri Balch

## 2022-12-23 NOTE — Discharge Summary (Signed)
Date of admission: 12/22/2022  Date of discharge: 12/23/2022  Admission diagnosis: Intermediate risk prostate cancer  Discharge diagnosis: Same as above  Secondary diagnoses:  Patient Active Problem List   Diagnosis Date Noted   Prostate cancer 12/22/2022   History of colonic polyps    Prediabetes 05/25/2022   Trochanteric bursitis of left hip 11/28/2020   Status post total hip replacement, right 05/04/2019   History of total hip arthroplasty 04/02/2018   Spinal stenosis of lumbar region 04/02/2018   Polyp of sigmoid colon    Benign neoplasm of transverse colon    Elevated PSA 04/10/2017   Nevus of back 01/10/2016   History of adenomatous polyp of colon 01/08/2016   Hyperlipidemia 01/08/2016   Enthesopathy of hip 01/08/2016   Impotence of organic origin 01/08/2016   History of tobacco use 01/08/2016   Morbid obesity (HCC) 11/17/2008   Essential hypertension 11/17/2008   ATRIAL FIBRILLATION 11/17/2008   History and Physical: For full details, please see admission history and physical. Briefly, Steven Saunders. is a 72 y.o. year old patient admitted on 12/22/2022 for scheduled robotic assisted laparoscopic radical prostatectomy with bilateral pelvic lymph node dissection with Dr. Richardo Hanks for management of intermediate risk prostate cancer.  A LLQ JP drain was placed intraoperatively.  This morning, he reports his pain is well-controlled.  He admits to pressure and discomfort along the top of his abdomen.  He had some nausea overnight and dizziness with ambulation.  AM labs with normal postoperative changes.  120 mL drain output overnight.  Foley catheter in place draining red urine.  BP drainage is red.  As of this afternoon, he has ambulated again without dizziness.  He is tolerating p.o. without nausea/vomiting.  Pain remains well-controlled.  Urine is clearing, now light pink.  Additional 55 mL of JP drainage since this morning.  Drain creatinine ordered.  Physical  Exam: Constitutional:  Alert and oriented, no acute distress, nontoxic appearing HEENT: [place], AT Cardiovascular: No clubbing, cyanosis, or edema Respiratory: Normal respiratory effort, no increased work of breathing GI: Abdomen is soft, distended, no rebound.  Surgical incisions noted over the anterior abdomen, all clean, dry, and intact with overlying surgical adhesive. Skin: No rashes, bruises or suspicious lesions Neurologic: Grossly intact, no focal deficits, moving all 4 extremities Psychiatric: Normal mood and affect   Hospital Course: Patient tolerated the procedure well.  He was then transferred to the floor after an uneventful PACU stay.  His hospital course was uncomplicated.  On POD#1 he had met discharge criteria: was eating a regular diet, was up and ambulating independently,  pain was well controlled, catheter and JP drain were draining well, and was ready for discharge.  Laboratory values:  Recent Labs    12/22/22 1700 12/23/22 0602  WBC 10.1 10.8*  HGB 13.9 12.6*  HCT 40.2 35.9*   Recent Labs    12/23/22 0602  NA 126*  K 3.9  CL 96*  CO2 23  GLUCOSE 179*  BUN 15  CREATININE 0.74  CALCIUM 8.3*   Results for orders placed or performed during the hospital encounter of 04/29/19  SARS CORONAVIRUS 2 (TAT 6-12 HRS) Nasal Swab Aptima Multi Swab     Status: None   Collection Time: 04/29/19  9:10 AM   Specimen: Aptima Multi Swab; Nasal Swab  Result Value Ref Range Status   SARS Coronavirus 2 NEGATIVE NEGATIVE Final    Comment: (NOTE) SARS-CoV-2 target nucleic acids are NOT DETECTED. The SARS-CoV-2 RNA is generally detectable in  upper and lower respiratory specimens during the acute phase of infection. Negative results do not preclude SARS-CoV-2 infection, do not rule out co-infections with other pathogens, and should not be used as the sole basis for treatment or other patient management decisions. Negative results must be combined with clinical  observations, patient history, and epidemiological information. The expected result is Negative. Fact Sheet for Patients: HairSlick.no Fact Sheet for Healthcare Providers: quierodirigir.com This test is not yet approved or cleared by the [place] FDA and  has been authorized for detection and/or diagnosis of SARS-CoV-2 by FDA under an Emergency Use Authorization (EUA). This EUA will remain  in effect (meaning this test can be used) for the duration of the COVID-19 declaration under Section 56 4(b)(1) of the Act, 21 U.S.C. section 360bbb-3(b)(1), unless the authorization is terminated or revoked sooner. Performed at [facility], [address], [place], [place] [address]    Disposition: Home  Discharge instruction: The patient was instructed to be ambulatory but told to refrain from heavy lifting, strenuous activity, or driving.  He was instructed to record his daily JP drainage with tentative plans for removal based on output later this week.  Foley catheter care instructions provided by nursing.  Discharge medications:  Allergies as of 12/23/2022   No Known Allergies      Medication List     TAKE these medications    acetaminophen 500 MG tablet Commonly known as: TYLENOL Take 500 mg by mouth daily as needed.   aspirin EC 81 MG tablet Take 81 mg by mouth daily. Swallow whole.   carvedilol 25 MG tablet Commonly known as: COREG TAKE 1 TABLET BY MOUTH TWICE DAILY   digoxin 0.125 MG tablet Commonly known as: LANOXIN TAKE ONE TABLET BY MOUTH DAILY   docusate sodium 100 MG capsule Commonly known as: COLACE Take 100 mg by mouth daily.   glucosamine-chondroitin 500-400 MG tablet Take 1 tablet by mouth daily.   HYDROcodone-acetaminophen 5-325 MG tablet Commonly known as: NORCO/VICODIN Take 1-2 tablets by mouth every 6 (six) hours as needed for moderate pain.   irbesartan 150 MG tablet Commonly known as:  AVAPRO Take 1 tablet (150 mg total) by mouth daily.   loratadine 10 MG tablet Commonly known as: CLARITIN Take 10 mg by mouth daily.   lovastatin 20 MG tablet Commonly known as: MEVACOR TAKE 1 TABLET BY MOUTH ONCE EVERY EVENING   multivitamin tablet Take 1 tablet by mouth daily.   oxybutynin 5 MG tablet Commonly known as: DITROPAN Take 1 tablet (5 mg total) by mouth every 8 (eight) hours as needed for bladder spasms.   sildenafil 100 MG tablet Commonly known as: VIAGRA Take -  by mouth as needed prior to intercourse   spironolactone 25 MG tablet Commonly known as: ALDACTONE TAKE 1 TABLET BY MOUTH ONCE DAILY   valACYclovir 1000 MG tablet Commonly known as: VALTREX TAKE 2 TABLETS BY MOUTH EVERY 12 HOURS AS NEEDED AS DIRECTED   Vitamin D-3 25 MCG (1000 UT) Caps Take 1 capsule by mouth daily.        Followup:   Follow-up Information     Carman Ching, PA-C Follow up in 3 day(s).   Specialty: Urology Why: We will call you later this week to discuss drain output and timing of drain removal. Contact information: [address] [place] [address] [phone]

## 2022-12-23 NOTE — Progress Notes (Signed)
DISCHARGE NOTE:  Pt, wife and son given discharge instructions and verbalized understanding. Pt d/c with JP Drain and foley. JP Drain and foley education done with pt and family. Pt wheeled to car by staff, Family providing transportation.

## 2022-12-23 NOTE — Progress Notes (Signed)
Creatinine fluid specimen collected and hand delivered to lab.

## 2022-12-23 NOTE — Plan of Care (Signed)
  Problem: Pain Management: Goal: General experience of comfort will improve Outcome: Progressing   Problem: Skin Integrity: Goal: Demonstration of wound healing without infection will improve Outcome: Progressing   Problem: Urinary Elimination: Goal: Ability to achieve and maintain urine output will improve Outcome: Progressing

## 2022-12-23 NOTE — Discharge Instructions (Signed)
Please measure and record your daily output from your abdominal drain.  We will call you from clinic later this week and determine timing of drain removal based on these numbers.  You do not need to record your urinary output.

## 2022-12-24 ENCOUNTER — Encounter: Payer: Self-pay | Admitting: Urology

## 2022-12-24 ENCOUNTER — Ambulatory Visit (INDEPENDENT_AMBULATORY_CARE_PROVIDER_SITE_OTHER): Payer: PPO | Admitting: Urology

## 2022-12-24 VITALS — BP 122/80 | HR 81 | Temp 97.8°F | Ht 72.0 in | Wt 260.0 lb

## 2022-12-24 DIAGNOSIS — R112 Nausea with vomiting, unspecified: Secondary | ICD-10-CM | POA: Diagnosis not present

## 2022-12-24 DIAGNOSIS — Z8546 Personal history of malignant neoplasm of prostate: Secondary | ICD-10-CM | POA: Diagnosis not present

## 2022-12-24 LAB — CREATININE, FLUID (PLEURAL, PERITONEAL, JP DRAINAGE): Creat, Fluid: 0.9 mg/dL

## 2022-12-24 MED ORDER — PROMETHAZINE HCL 25 MG/ML IJ SOLN
25.0000 mg | Freq: Once | INTRAMUSCULAR | Status: AC
Start: 2022-12-24 — End: 2022-12-24
  Administered 2022-12-24: 25 mg via INTRAMUSCULAR

## 2022-12-24 MED ORDER — ONDANSETRON 8 MG PO TBDP
8.0000 mg | ORAL_TABLET | Freq: Three times a day (TID) | ORAL | 0 refills | Status: DC | PRN
Start: 2022-12-24 — End: 2023-02-24

## 2022-12-24 NOTE — Progress Notes (Signed)
12/24/2022 10:44 PM   Steven Saunders. May 31, 1951 [id number]  Referring provider: [name], MD [address] [place],  [place] [address]  Urological history: 1. Prostate cancer -PSA (04/024) 11.0 -s/p robotic prostatectomy with lymph node dissection  -surgical pathology pending    Chief Complaint  Patient presents with   Follow-up    HPI: Steven Saunders. is a 72 y.o. male who presents today for nausea and vomiting with his wife, Steven Saunders, and his son.  Previous records reviewed.   He underwent robotic prostatectomy on 12/22/2022.  He was discharged from the hospital yesterday.  He was able to tolerate regular diet at lunch time, but as the evening progressed.  He started to experience nausea and vomiting.  He has been uanble to keep foods and medications down.  He has thrown up twice during hie office visit.   He is also having leakage around his JP drain site.    Patient denies any modifying or aggravating factors.  Patient denies any gross hematuria, dysuria or suprapubic/flank pain.  Patient denies any fevers, chills, nausea or vomiting.    PMH: Past Medical History:  Diagnosis Date   Adenocarcinoma of prostate    a.) Bx (+) Gleason 6 (8/12 cores) with PSA 5.3 in 2018; b.) Bx (+) 2019 Gleason 6 (9/12 cores) with PSA 6.3; c.) 09/2022 --> stage IIIb (cT3b N0 M0) Gleason 7 (3+4) with  PSA of 11 --> prostate MRI with signs of extracapsular extension and suspicion for seminal vesicle involvement of the right base  --> PSMA PET 11/03/2022 (+) FDG uptake right lateral gland (SUV max 13.1); no metastatic disease.   Aortic atherosclerosis    CAD (coronary artery disease) 09/19/2005   a.) LHC 09/19/2005: 30 mLAD, 30% dLAD - med mgmt.   ED (erectile dysfunction)    a.) on PDE5i (sildenafil)   HLD (hyperlipidemia)    Hypertension    Permanent atrial fibrillation    a.) CHA2DS2VASc = 3 (age, HTN, vascular disease history);  b.) rate/rhythm maintained on  oral digoxin + carvedilol; refuses chronically anticoagulation   RBBB     Surgical History: Past Surgical History:  Procedure Laterality Date   APPENDECTOMY     COLONOSCOPY WITH PROPOFOL N/A 05/26/2017   Procedure: COLONOSCOPY WITH PROPOFOL;  Surgeon: Midge Minium, MD;  Location: [facility] ENDOSCOPY;  Service: Endoscopy;  Laterality: N/A;   COLONOSCOPY WITH PROPOFOL N/A 06/17/2022   Procedure: COLONOSCOPY WITH PROPOFOL;  Surgeon: Midge Minium, MD;  Location: [facility] ENDOSCOPY;  Service: Endoscopy;  Laterality: N/A;   HERNIA REPAIR     1950's x 2   JOINT REPLACEMENT Left 2014   LEFT HEART CATH AND CORONARY ANGIOGRAPHY Left 09/19/2005   Procedure: LEFT HEART CATH AND CORONARY ANGIOGRAPHY; Location: [facility]; Surgeon: Charlton Haws, MD   PELVIC LYMPH NODE DISSECTION Bilateral 12/22/2022   Procedure: PELVIC LYMPH NODE DISSECTION;  Surgeon: Sondra Come, MD;  Location: [facility] ORS;  Service: Urology;  Laterality: Bilateral;   PROSTATE BIOPSY     ROBOT ASSISTED LAPAROSCOPIC RADICAL PROSTATECTOMY N/A 12/22/2022   Procedure: XI ROBOTIC ASSISTED LAPAROSCOPIC RADICAL PROSTATECTOMY;  Surgeon: Sondra Come, MD;  Location: [facility] ORS;  Service: Urology;  Laterality: N/A;   thumb surgery  1980   had staph infection and had to cut out the area   TOTAL HIP ARTHROPLASTY Left 2014   Dr. Hyacinth Meeker   TOTAL HIP ARTHROPLASTY Right 05/04/2019   Procedure: TOTAL HIP ARTHROPLASTY ANTERIOR APPROACH;  Surgeon: Cassell Smiles  R, MD;  Location: [facility] ORS;  Service: Orthopedics;  Laterality: Right;    Home Medications:  Allergies as of 12/24/2022   No Known Allergies      Medication List        Accurate as of December 24, 2022 10:44 PM. If you have any questions, ask your nurse or doctor.          acetaminophen 500 MG tablet Commonly known as: TYLENOL Take 500 mg by mouth daily as needed.   aspirin EC 81 MG tablet Take 81 mg by mouth daily. Swallow whole.   carvedilol 25 MG tablet Commonly known as:  COREG TAKE 1 TABLET BY MOUTH TWICE DAILY   digoxin 0.125 MG tablet Commonly known as: LANOXIN TAKE ONE TABLET BY MOUTH DAILY   docusate sodium 100 MG capsule Commonly known as: COLACE Take 100 mg by mouth daily.   glucosamine-chondroitin 500-400 MG tablet Take 1 tablet by mouth daily.   HYDROcodone-acetaminophen 5-325 MG tablet Commonly known as: NORCO/VICODIN Take 1-2 tablets by mouth every 6 (six) hours as needed for moderate pain.   irbesartan 150 MG tablet Commonly known as: AVAPRO Take 1 tablet (150 mg total) by mouth daily.   loratadine 10 MG tablet Commonly known as: CLARITIN Take 10 mg by mouth daily.   lovastatin 20 MG tablet Commonly known as: MEVACOR TAKE 1 TABLET BY MOUTH ONCE EVERY EVENING   multivitamin tablet Take 1 tablet by mouth daily.   ondansetron 8 MG disintegrating tablet Commonly known as: ZOFRAN-ODT Take 1 tablet (8 mg total) by mouth every 8 (eight) hours as needed for nausea or vomiting. Started by: Michiel Cowboy, PA-C   oxybutynin 5 MG tablet Commonly known as: DITROPAN Take 1 tablet (5 mg total) by mouth every 8 (eight) hours as needed for bladder spasms.   sildenafil 100 MG tablet Commonly known as: VIAGRA Take -  by mouth as needed prior to intercourse   spironolactone 25 MG tablet Commonly known as: ALDACTONE TAKE 1 TABLET BY MOUTH ONCE DAILY   valACYclovir 1000 MG tablet Commonly known as: VALTREX TAKE 2 TABLETS BY MOUTH EVERY 12 HOURS AS NEEDED AS DIRECTED   Vitamin D-3 25 MCG (1000 UT) Caps Take 1 capsule by mouth daily.        Allergies: No Known Allergies  Family History: Family History  Problem Relation Age of Onset   Hypertension Mother    Heart attack Father    Melanoma Father    Prostate cancer Neg Hx    Bladder Cancer Neg Hx    Kidney cancer Neg Hx     Social History:  reports that he quit smoking about 13 years ago. His smoking use included cigarettes. He has a 30.00 pack-year smoking  history. He has never used smokeless tobacco. He reports current alcohol use of about 4.0 - 8.0 standard drinks of alcohol per week. He reports that he does not use drugs.  ROS: Pertinent ROS in HPI  Physical Exam: BP 122/80   Pulse 81   Temp 97.8 F (36.6 C) (Oral)   Ht 6' (1.829 m)   Wt 260 lb (117.9 kg)   BMI 35.26 kg/m   Constitutional:  Well nourished. Alert and oriented, No acute distress. HEENT: [place] AT, moist mucus membranes.  Trachea midline Cardiovascular: No clubbing, cyanosis, or edema. Respiratory: Normal respiratory effort, no increased work of breathing. GI: Abdomen is distended, no abdominal masses.  Incisions were clean and dry.   Neurologic: Grossly intact, no focal deficits, moving all  4 extremities. Psychiatric: Normal mood and affect.  Laboratory Data: Lab Results  Component Value Date   WBC 10.8 (H) 12/23/2022   HGB 12.6 (L) 12/23/2022   HCT 35.9 (L) 12/23/2022   MCV 94.5 12/23/2022   PLT 141 (L) 12/23/2022    Lab Results  Component Value Date   CREATININE 0.74 12/23/2022    Lab Results  Component Value Date   HGBA1C 6.0 (H) 05/21/2022    Lab Results  Component Value Date   TSH 1.870 05/21/2022       Component Value Date/Time   CHOL 131 05/21/2022 1038   HDL 44 05/21/2022 1038   CHOLHDL 3.0 05/21/2022 1038   LDLCALC 65 05/21/2022 1038    Lab Results  Component Value Date   AST 19 12/15/2022   Lab Results  Component Value Date   ALT 21 12/15/2022  I have reviewed the labs.   Pertinent Imaging: N/A  Procedure JP drain removed without issues  Assessment & Plan:    1. Prostate cancer -s/p prostatectomy -likely having issues with an ileus -given phenergan  25 mg IM here in the office -prescription for Zofran 8 mg ODT every 8 hours  -took Colace -return precautions given    Return for keep follow up with Dr. Richardo Hanks on Tuesdat .  These notes generated with voice recognition software. I apologize for typographical  errors.  Cloretta Ned  [facility] [address]  [address] [place], [place] [address] [phone]

## 2022-12-25 ENCOUNTER — Telehealth: Payer: Self-pay | Admitting: Urology

## 2022-12-25 LAB — SURGICAL PATHOLOGY

## 2022-12-25 NOTE — Telephone Encounter (Signed)
He is doing better this am.  He has not yet had flatus, but he is tolerating liquids better.  The vomiting has stopped, and his nausea has decreased.  He also feels like the gas in his stomach is becoming more organized and hopefully will have either a bowel movement or passage of flatus this afternoon.

## 2022-12-25 NOTE — Telephone Encounter (Signed)
He continues to improve.  No gas yet.

## 2022-12-26 ENCOUNTER — Telehealth: Payer: Self-pay | Admitting: Urology

## 2022-12-26 ENCOUNTER — Telehealth: Payer: Self-pay

## 2022-12-26 NOTE — Telephone Encounter (Signed)
Pt calls triage line and states that he has noticed some leakage around his catheter and questions if this is normal. Patient denies pain with urine leakage. Also denies fever, chills, abdominal and flank pain.   Advise pt on bladder spasms. He states that he has only taken 1 dose of oxybutynin today, advised pt that he is able to take another dose in 8hrs, pt voiced understanding. Advised pt on wearing depends or pad for leakage.

## 2022-12-26 NOTE — Telephone Encounter (Signed)
He is passing flatus this am.

## 2022-12-29 ENCOUNTER — Encounter: Payer: PPO | Admitting: Physician Assistant

## 2022-12-30 ENCOUNTER — Encounter: Payer: Self-pay | Admitting: Urology

## 2022-12-30 ENCOUNTER — Ambulatory Visit (INDEPENDENT_AMBULATORY_CARE_PROVIDER_SITE_OTHER): Payer: PPO | Admitting: Urology

## 2022-12-30 VITALS — BP 107/73 | HR 76 | Ht 72.0 in | Wt 261.0 lb

## 2022-12-30 DIAGNOSIS — Z466 Encounter for fitting and adjustment of urinary device: Secondary | ICD-10-CM

## 2022-12-30 DIAGNOSIS — Z8546 Personal history of malignant neoplasm of prostate: Secondary | ICD-10-CM

## 2022-12-30 DIAGNOSIS — C61 Malignant neoplasm of prostate: Secondary | ICD-10-CM

## 2022-12-30 MED ORDER — CEPHALEXIN 250 MG PO CAPS
500.0000 mg | ORAL_CAPSULE | Freq: Once | ORAL | Status: AC
Start: 2022-12-30 — End: 2022-12-30
  Administered 2022-12-30: 500 mg via ORAL

## 2022-12-30 NOTE — Progress Notes (Signed)
   12/30/2022 10:51 AM   Steven Saunders. 10/29/1950 [id number]  Reason for visit: Follow up prostate cancer status post robotic prostatectomy  HPI: 72 year old male with long history of favorable intermediate risk prostate cancer who was previously managed by a different urologist and had opted for active surveillance.  In the setting of a persistently rising PSA we ordered a prostate MRI which showed a PI-RADS 5 lesion in the right anterior lateral base and some irregular appearance alongside the capsule concerning for extracapsular extension and SV involvement and he ultimately opted for surgery.  He underwent robotic radical prostatectomy and bilateral pelvic lymph node dissection on 12/22/2022.  Drain was left postoperatively, and drain creatinine POD#1 was 0.9 consistent with serum and his drain was ultimately removed.  He saw our PA this week for nausea vomiting, and they recommended backing down to brat diet.  He was not having any fevers or severe abdominal pain.  He has having normal bowel movements at this point, appetite slowly returning.  Abdomen is soft, mildly distended, incisions clean dry and intact, catheter with yellow urine.  Catheter was removed today, Keflex given for prophylaxis.  We reviewed his pathology results today showing negative lymph nodes, 80% of prostate involved with grade group 2 Gleason score 3+4= 7 disease including a positive margin at the bladder neck.  We discussed possible need for additional treatments like radiation or ADT in the future pending initial postop PSA in 8 weeks.  Encouraged to advance diet slowly, return precautions discussed at length including fever, nausea/vomiting, abdominal pain RTC 8 weeks PSA prior   Sondra Come, MD  [facility] [address] [place], [place] [address] [phone]

## 2022-12-31 ENCOUNTER — Other Ambulatory Visit: Payer: Self-pay | Admitting: Family Medicine

## 2022-12-31 DIAGNOSIS — I10 Essential (primary) hypertension: Secondary | ICD-10-CM

## 2023-01-02 ENCOUNTER — Telehealth: Payer: Self-pay | Admitting: *Deleted

## 2023-01-02 NOTE — Telephone Encounter (Signed)
Pt and wife calling asking if some redness around the incision site is normal. I asked if he had any fever, puss or smelly odor coming from the site and they both said no. Per husband it has started itching and he has been scratching and he also too a hot shower. Wife was more concerned and per husband he is ok, redness only today. I explained that the incision is healing. They will continue to watch and call back if any problems.

## 2023-01-21 DIAGNOSIS — H2513 Age-related nuclear cataract, bilateral: Secondary | ICD-10-CM | POA: Diagnosis not present

## 2023-01-21 DIAGNOSIS — H35722 Serous detachment of retinal pigment epithelium, left eye: Secondary | ICD-10-CM | POA: Diagnosis not present

## 2023-01-21 DIAGNOSIS — H353111 Nonexudative age-related macular degeneration, right eye, early dry stage: Secondary | ICD-10-CM | POA: Diagnosis not present

## 2023-02-03 DIAGNOSIS — H353111 Nonexudative age-related macular degeneration, right eye, early dry stage: Secondary | ICD-10-CM | POA: Diagnosis not present

## 2023-02-03 DIAGNOSIS — H2513 Age-related nuclear cataract, bilateral: Secondary | ICD-10-CM | POA: Diagnosis not present

## 2023-02-03 DIAGNOSIS — H353221 Exudative age-related macular degeneration, left eye, with active choroidal neovascularization: Secondary | ICD-10-CM | POA: Diagnosis not present

## 2023-02-24 ENCOUNTER — Encounter: Payer: Self-pay | Admitting: Urology

## 2023-02-24 ENCOUNTER — Other Ambulatory Visit
Admission: RE | Admit: 2023-02-24 | Discharge: 2023-02-24 | Disposition: A | Discharge status: Home / Self Care | Payer: PPO | Attending: Urology | Admitting: Urology

## 2023-02-24 ENCOUNTER — Ambulatory Visit (INDEPENDENT_AMBULATORY_CARE_PROVIDER_SITE_OTHER): Payer: PPO | Admitting: Urology

## 2023-02-24 VITALS — BP 150/83 | HR 75 | Ht 72.0 in | Wt 250.0 lb

## 2023-02-24 DIAGNOSIS — N529 Male erectile dysfunction, unspecified: Secondary | ICD-10-CM

## 2023-02-24 DIAGNOSIS — N393 Stress incontinence (female) (male): Secondary | ICD-10-CM

## 2023-02-24 DIAGNOSIS — C61 Malignant neoplasm of prostate: Secondary | ICD-10-CM

## 2023-02-24 MED ORDER — TADALAFIL 10 MG PO TABS: 10.0000 mg | ORAL_TABLET | Freq: Every day | ORAL | 11 refills | Status: DC | PRN

## 2023-02-24 NOTE — Patient Instructions (Signed)
Kegel Exercises  Kegel exercises can help strengthen your pelvic floor muscles. The pelvic floor is a group of muscles that support your rectum, small intestine, and bladder. In females, pelvic floor muscles also help support the uterus. These muscles help you control the flow of urine and stool (feces). Kegel exercises are painless and simple. They do not require any equipment. Your provider may suggest Kegel exercises to: Improve bladder and bowel control. Improve sexual response. Improve weak pelvic floor muscles after surgery to remove the uterus (hysterectomy) or after pregnancy, in females. Improve weak pelvic floor muscles after prostate gland removal or surgery, in males. Kegel exercises involve squeezing your pelvic floor muscles. These are the same muscles you squeeze when you try to stop the flow of urine or keep from passing gas. The exercises can be done while sitting, standing, or lying down, but it is best to vary your position. Ask your health care provider which exercises are safe for you. Do exercises exactly as told by your health care provider and adjust them as directed. Do not begin these exercises until told by your health care provider. Exercises How to do Kegel exercises: Squeeze your pelvic floor muscles tight. You should feel a tight lift in your rectal area. If you are a male, you should also feel a tightness in your vaginal area. Keep your stomach, buttocks, and legs relaxed. Hold the muscles tight for up to 10 seconds. Breathe normally. Relax your muscles for up to 10 seconds. Repeat as told by your health care provider. Repeat this exercise daily as told by your health care provider. Continue to do this exercise for at least 4-6 weeks, or for as long as told by your health care provider. You may be referred to a physical therapist who can help you learn more about how to do Kegel exercises. Depending on your condition, your health care provider may  recommend: Varying how long you squeeze your muscles. Doing several sets of exercises every day. Doing exercises for several weeks. Making Kegel exercises a part of your regular exercise routine. This information is not intended to replace advice given to you by your health care provider. Make sure you discuss any questions you have with your health care provider. Document Revised: 12/27/2020 Document Reviewed: 12/27/2020 Elsevier Patient Education  2024 Elsevier [employer].   Minimize fluids 4 hours prior to bedtime, make sure to urinate right before going to bed.  Avoid diet or artificially sweetened drinks(Gatorade zero) as these can cause increased frequency or urgency to pee

## 2023-02-24 NOTE — Addendum Note (Signed)
Addended by: Lanae Boast on: 02/24/2023 03:41 PM   Modules accepted: Orders

## 2023-02-24 NOTE — Progress Notes (Signed)
   02/24/2023 10:17 AM   Steven Saunders. 1951-04-09 [id number]  Reason for visit: Follow up prostate cancer status post robotic prostatectomy  HPI: 72 year old male with long history of favorable intermediate risk prostate cancer who was previously managed by a different urologist and had opted for active surveillance.  In the setting of a persistently rising PSA(11.0) we ordered a prostate MRI in January 2024 which showed a PI-RADS 5 lesion in the right anterior lateral base and some irregular appearance alongside the capsule concerning for extracapsular extension and SV involvement and he ultimately opted for surgery.  He underwent robotic radical prostatectomy and bilateral pelvic lymph node dissection on 12/22/2022.  Pathology results showed negative lymph nodes, 80% of prostate involved with grade group 2 Gleason score 3+4= 7 disease including a positive margin at the bladder neck.    PSA today is still pending  He continues to have moderate stress incontinence that requires a depends and multiple pads during the day, he has not started any Kegel exercises yet.  Interestingly, he is having some urgency and nocturia about every hour overnight, which is worsened from 3 times nightly prior to surgery.  He denies the same urgency or frequency during the day.  He drinks primarily water and Gatorade.  No erections since surgery, was using sildenafil as needed prior to surgery.  Call with PSA results Trial of Cialis 10 mg daily for ED Encouraged to start Kegel exercises, avoid bladder irritants, minimize fluids prior to bedtime  Sondra Come, MD  [facility] [address], [address] [place], [place] [address] [phone]

## 2023-02-25 LAB — PSA: Prostatic Specific Antigen: 0.02 ng/mL (ref 0.00–4.00)

## 2023-02-26 ENCOUNTER — Other Ambulatory Visit: Payer: Self-pay | Admitting: Family Medicine

## 2023-02-26 ENCOUNTER — Other Ambulatory Visit: Payer: Self-pay

## 2023-02-26 DIAGNOSIS — C61 Malignant neoplasm of prostate: Secondary | ICD-10-CM

## 2023-02-26 DIAGNOSIS — I10 Essential (primary) hypertension: Secondary | ICD-10-CM

## 2023-03-02 ENCOUNTER — Ambulatory Visit (INDEPENDENT_AMBULATORY_CARE_PROVIDER_SITE_OTHER): Payer: PPO | Admitting: Family Medicine

## 2023-03-02 ENCOUNTER — Encounter: Payer: Self-pay | Admitting: Family Medicine

## 2023-03-02 VITALS — BP 141/81 | HR 63 | Ht 72.0 in | Wt 252.0 lb

## 2023-03-02 DIAGNOSIS — I10 Essential (primary) hypertension: Secondary | ICD-10-CM

## 2023-03-02 DIAGNOSIS — Z87891 Personal history of nicotine dependence: Secondary | ICD-10-CM | POA: Diagnosis not present

## 2023-03-02 DIAGNOSIS — I4821 Permanent atrial fibrillation: Secondary | ICD-10-CM

## 2023-03-02 DIAGNOSIS — E871 Hypo-osmolality and hyponatremia: Secondary | ICD-10-CM

## 2023-03-02 DIAGNOSIS — H3530 Unspecified macular degeneration: Secondary | ICD-10-CM | POA: Diagnosis not present

## 2023-03-02 DIAGNOSIS — R7303 Prediabetes: Secondary | ICD-10-CM | POA: Diagnosis not present

## 2023-03-02 DIAGNOSIS — E785 Hyperlipidemia, unspecified: Secondary | ICD-10-CM

## 2023-03-03 DIAGNOSIS — H353221 Exudative age-related macular degeneration, left eye, with active choroidal neovascularization: Secondary | ICD-10-CM | POA: Diagnosis not present

## 2023-03-03 LAB — COMPREHENSIVE METABOLIC PANEL
ALT: 17 IU/L (ref 0–44)
AST: 17 IU/L (ref 0–40)
Albumin: 4.5 g/dL (ref 3.8–4.8)
Alkaline Phosphatase: 79 IU/L (ref 44–121)
BUN/Creatinine Ratio: 14 (ref 10–24)
BUN: 12 mg/dL (ref 8–27)
Bilirubin Total: 0.7 mg/dL (ref 0.0–1.2)
CO2: 23 mmol/L (ref 20–29)
Calcium: 9.6 mg/dL (ref 8.6–10.2)
Chloride: 97 mmol/L (ref 96–106)
Creatinine, Ser: 0.87 mg/dL (ref 0.76–1.27)
Globulin, Total: 2.4 g/dL (ref 1.5–4.5)
Glucose: 123 mg/dL — ABNORMAL HIGH (ref 70–99)
Potassium: 5.3 mmol/L — ABNORMAL HIGH (ref 3.5–5.2)
Sodium: 132 mmol/L — ABNORMAL LOW (ref 134–144)
Total Protein: 6.9 g/dL (ref 6.0–8.5)
eGFR: 91 mL/min/{1.73_m2} (ref >59)

## 2023-03-03 LAB — TSH: TSH: 1.46 u[IU]/mL (ref 0.450–4.500)

## 2023-03-03 LAB — HEMOGLOBIN A1C
Est. average glucose Bld gHb Est-mCnc: 105 mg/dL
Hgb A1c MFr Bld: 5.3 % (ref 4.8–5.6)

## 2023-03-06 ENCOUNTER — Other Ambulatory Visit: Payer: PPO

## 2023-03-12 ENCOUNTER — Ambulatory Visit: Payer: PPO | Admitting: Urology

## 2023-03-13 NOTE — Patient Instructions (Signed)
.   Please review the attached list of medications and notify my office if there are any errors.   . Please bring all of your medications to every appointment so we can make sure that our medication list is the same as yours.   

## 2023-03-13 NOTE — Progress Notes (Signed)
Established patient visit   Patient: Steven Saunders.   DOB: May 30, 1951   72 y.o. Male  MRN: [id number] Visit Date: 03/02/2023  Today's healthcare provider: Mila Merry, MD   Chief Complaint  Patient presents with   Hypertension   Subjective    Discussed the use of AI scribe software for clinical note transcription with the patient, who gave verbal consent to proceed.  History of Present Illness   The patient, with a history of prostate cancer presents for follow-up of multiple chronic conditions. He underwent prostatectomy in April and is currently working on regaining normal bladder function, which he reports is improving. He was diagnosed with macular degeneration in his left eye a few weeks post-prostatectomy and is receiving monthly eye injections. He is under the care of Dr. Regino Schultze for this condition.  He also has a history of consistently low sodium levels, which were noted to be lower than usual during his hospital stay for the prostatectomy when it dropped to 126. He also has a history of prediabetes, with his last A1c in September being 6.0. He is currently on irbesartan, carvedilol, and aldactone for blood pressure management and has not run out of these medications. He does not regularly monitor his blood pressure at home. He has been trying to reduce his salt intake. He has not reported any symptoms of shortness of breath, chest pain, coughing, or swelling in his hands, feet, or ankles. He continues to see his cardiologist, Dr. Ladona Ridgel, once a year.       Medications: Outpatient Medications Prior to Visit  Medication Sig   acetaminophen (TYLENOL) 500 MG tablet Take 500 mg by mouth daily as needed.    aspirin EC 81 MG tablet Take 81 mg by mouth daily. Swallow whole.   carvedilol (COREG) 25 MG tablet TAKE 1 TABLET BY MOUTH TWICE DAILY   Cholecalciferol (VITAMIN D-3) 25 MCG (1000 UT) CAPS Take 1 capsule by mouth daily.   digoxin (LANOXIN) 0.125 MG tablet TAKE ONE  TABLET BY MOUTH DAILY   docusate sodium (COLACE) 100 MG capsule Take 100 mg by mouth daily.   glucosamine-chondroitin 500-400 MG tablet Take 1 tablet by mouth daily.   irbesartan (AVAPRO) 150 MG tablet TAKE 1 TABLET BY MOUTH ONCE DAILY. *STOPTAKING 300/12.5   loratadine (CLARITIN) 10 MG tablet Take 10 mg by mouth daily.   lovastatin (MEVACOR) 20 MG tablet TAKE 1 TABLET BY MOUTH ONCE EVERY EVENING   Misc Natural Products (LUTEIN 20 PO) Take by mouth.   Multiple Vitamin (MULTIVITAMIN) tablet Take 1 tablet by mouth daily.   spironolactone (ALDACTONE) 25 MG tablet TAKE 1 TABLET BY MOUTH ONCE DAILY   tadalafil (CIALIS) 10 MG tablet Take 1 tablet (10 mg total) by mouth daily as needed for erectile dysfunction.   valACYclovir (VALTREX) 1000 MG tablet TAKE 2 TABLETS BY MOUTH EVERY 12 HOURS AS NEEDED AS DIRECTED   No facility-administered medications prior to visit.   Review of Systems  Constitutional:  Negative for appetite change, chills and fever.  Respiratory:  Negative for chest tightness, shortness of breath and wheezing.   Cardiovascular:  Negative for chest pain and palpitations.  Gastrointestinal:  Negative for abdominal pain, nausea and vomiting.       Objective    BP (!) 141/81   Pulse 63   Ht 6' (1.829 m)   Wt 252 lb (114.3 kg)   BMI 34.18 kg/m     Physical Exam   VITALS: BP- 148/72  CHEST: Breath sounds clear bilaterally. EXTREMITIES: No edema. Presence of bruise on hand.       Assessment & Plan     Assessment and Plan    Hypertension: On Irbesartan, Carvedilol, and Aldactone. No recent blood pressure readings available. -Check blood pressure today. -Consider potential adjustments to medication regimen based on electrolyte results.   Post-Prostatectomy: Recovery from prostatectomy in April with ongoing urinary incontinence. Gradual improvement noted. -Continue current management and monitor progress.  Macular Degeneration: Recent diagnosis with ongoing treatment  by Dr. Regino Schultze. On monthly intraocular injections. -Continue treatment with Dr. Regino Schultze and monitor progress.  Hyponatremia: Noted low sodium levels (126) during recent hospitalization, typically runs around 130. Likely related to recent surgery and fluid management. -Check sodium levels today to ensure he has normalized.  Prediabetes: A1c of 6.0 in September, indicating borderline diabetes. -Check A1c today to monitor for progression towards diabetes.  General Health Maintenance: -Continue annual follow-up with cardiologist Dr. Ladona Ridgel. -Encourage continued low-salt diet for blood pressure management. -Return for follow-up in a few days to discuss lab results and potential medication adjustments.          Mila Merry, MD  [facility] [phone] (phone) [phone] (fax)  [facility]

## 2023-04-07 DIAGNOSIS — H353221 Exudative age-related macular degeneration, left eye, with active choroidal neovascularization: Secondary | ICD-10-CM | POA: Diagnosis not present

## 2023-04-21 ENCOUNTER — Other Ambulatory Visit
Admission: RE | Admit: 2023-04-21 | Discharge: 2023-04-21 | Disposition: A | Discharge status: Home / Self Care | Payer: PPO | Attending: Urology | Admitting: Urology

## 2023-04-21 DIAGNOSIS — C61 Malignant neoplasm of prostate: Secondary | ICD-10-CM | POA: Insufficient documentation

## 2023-04-21 LAB — PSA: Prostatic Specific Antigen: 0.01 ng/mL (ref 0.00–4.00)

## 2023-04-22 DIAGNOSIS — C61 Malignant neoplasm of prostate: Secondary | ICD-10-CM

## 2023-04-22 NOTE — Telephone Encounter (Signed)
PSA ordered. Appt scheduled.

## 2023-05-12 DIAGNOSIS — H353221 Exudative age-related macular degeneration, left eye, with active choroidal neovascularization: Secondary | ICD-10-CM | POA: Diagnosis not present

## 2023-06-16 DIAGNOSIS — H353221 Exudative age-related macular degeneration, left eye, with active choroidal neovascularization: Secondary | ICD-10-CM | POA: Diagnosis not present

## 2023-07-06 ENCOUNTER — Other Ambulatory Visit: Payer: Self-pay | Admitting: Family Medicine

## 2023-07-08 ENCOUNTER — Telehealth: Payer: Self-pay

## 2023-07-08 ENCOUNTER — Ambulatory Visit (INDEPENDENT_AMBULATORY_CARE_PROVIDER_SITE_OTHER): Payer: PPO | Admitting: Emergency Medicine

## 2023-07-08 VITALS — Ht 72.0 in | Wt 260.0 lb

## 2023-07-08 DIAGNOSIS — Z0000 Encounter for general adult medical examination without abnormal findings: Secondary | ICD-10-CM | POA: Diagnosis not present

## 2023-07-08 NOTE — Telephone Encounter (Signed)
FYI

## 2023-07-08 NOTE — Progress Notes (Signed)
Subjective:   Steven Bastin. is a 72 y.o. male who presents for Medicare Annual/Subsequent preventive examination.  Visit Complete: Virtual I connected with  Steven Saunders. on 07/08/23 by a audio enabled telemedicine application and verified that I am speaking with the correct person using two identifiers.  Patient Location: Home  Provider Location: Home Office  I discussed the limitations of evaluation and management by telemedicine. The patient expressed understanding and agreed to proceed.  Vital Signs: Because this visit was a virtual/telehealth visit, some criteria may be missing or patient reported. Any vitals not documented were not able to be obtained and vitals that have been documented are patient reported.  Patient Medicare AWV questionnaire was completed by the patient on 07/04/23; I have confirmed that all information answered by patient is correct and no changes since this date.  Cardiac Risk Factors include: advanced age (>68men, >22 women);male gender;hypertension;dyslipidemia;obesity (BMI >30kg/m2);Other (see comment), Risk factor comments: prediabetic     Objective:    Today's Vitals   07/08/23 1117  Weight: 260 lb (117.9 kg)  Height: 6' (1.829 m)   Body mass index is 35.26 kg/m.     07/08/2023   11:29 AM 12/22/2022    9:36 AM 12/12/2022    8:40 AM 10/15/2022    1:28 PM 06/17/2022    7:49 AM 02/17/2022    8:50 AM 06/06/2020   10:24 AM  Advanced Directives  Does Patient Have a Medical Advance Directive? Yes Yes Yes Yes Yes Yes Yes  Type of Estate agent of [place];Living will Healthcare Power of [place];Living will Healthcare Power of [place];Living will Healthcare Power of [place];Living will  Healthcare Power of [place];Living will Healthcare Power of [place];Living will  Does patient want to make changes to medical advance directive? No - Patient declined No - Patient declined No - Patient declined No - Patient declined   Yes (Inpatient - patient defers changing a medical advance directive and declines information at this time)   Copy of Healthcare Power of Attorney in Chart? Yes - validated most recent copy scanned in chart (See row information) No - copy requested No - copy requested No - copy requested  Yes - validated most recent copy scanned in chart (See row information) Yes - validated most recent copy scanned in chart (See row information)  Would patient like information on creating a medical advance directive?      No - Patient declined     Current Medications (verified) Outpatient Encounter Medications as of 07/08/2023  Medication Sig   acetaminophen (TYLENOL) 500 MG tablet Take 500 mg by mouth daily as needed.    aspirin EC 81 MG tablet Take 81 mg by mouth daily. Swallow whole.   carvedilol (COREG) 25 MG tablet TAKE 1 TABLET BY MOUTH TWICE DAILY   Cholecalciferol (VITAMIN D-3) 25 MCG (1000 UT) CAPS Take 1 capsule by mouth daily.   digoxin (LANOXIN) 0.125 MG tablet TAKE ONE TABLET BY MOUTH DAILY   docusate sodium (COLACE) 100 MG capsule Take 100 mg by mouth daily.   glucosamine-chondroitin 500-400 MG tablet Take 1 tablet by mouth daily.   irbesartan (AVAPRO) 150 MG tablet TAKE 1 TABLET BY MOUTH ONCE DAILY. *STOPTAKING 300/12.5   loratadine (CLARITIN) 10 MG tablet Take 10 mg by mouth daily.   lovastatin (MEVACOR) 20 MG tablet TAKE 1 TABLET BY MOUTH ONCE EVERY EVENING   Misc Natural Products (LUTEIN 20 PO) Take by mouth.   Multiple Vitamin (MULTIVITAMIN) tablet Take 1  tablet by mouth daily.   spironolactone (ALDACTONE) 25 MG tablet TAKE 1 TABLET BY MOUTH ONCE DAILY   tadalafil (CIALIS) 10 MG tablet Take 1 tablet (10 mg total) by mouth daily as needed for erectile dysfunction.   valACYclovir (VALTREX) 1000 MG tablet TAKE 2 TABLETS BY MOUTH EVERY 12 HOURS AS NEEDED AS DIRECTED   No facility-administered encounter medications on file as of 07/08/2023.    Allergies (verified) Patient has no known  allergies.   History: Past Medical History:  Diagnosis Date   Adenocarcinoma of prostate (HCC)    a.) Bx (+) Gleason 6 (8/12 cores) with PSA 5.3 in 2018; b.) Bx (+) 2019 Gleason 6 (9/12 cores) with PSA 6.3; c.) 09/2022 --> stage IIIb (cT3b N0 M0) Gleason 7 (3+4) with  PSA of 11 --> prostate MRI with signs of extracapsular extension and suspicion for seminal vesicle involvement of the right base  --> PSMA PET 11/03/2022 (+) FDG uptake right lateral gland (SUV max 13.1); no metastatic disease.   Aortic atherosclerosis (HCC)    CAD (coronary artery disease) 09/19/2005   a.) LHC 09/19/2005: 30 mLAD, 30% dLAD - med mgmt.   ED (erectile dysfunction)    a.) on PDE5i (sildenafil)   HLD (hyperlipidemia)    Hypertension    Permanent atrial fibrillation (HCC)    a.) CHA2DS2VASc = 3 (age, HTN, vascular disease history);  b.) rate/rhythm maintained on oral digoxin + carvedilol; refuses chronically anticoagulation   RBBB    Past Surgical History:  Procedure Laterality Date   APPENDECTOMY     COLONOSCOPY WITH PROPOFOL N/A 05/26/2017   Procedure: COLONOSCOPY WITH PROPOFOL;  Surgeon: Midge Minium, MD;  Location: [facility] ENDOSCOPY;  Service: Endoscopy;  Laterality: N/A;   COLONOSCOPY WITH PROPOFOL N/A 06/17/2022   Procedure: COLONOSCOPY WITH PROPOFOL;  Surgeon: Midge Minium, MD;  Location: [facility] ENDOSCOPY;  Service: Endoscopy;  Laterality: N/A;   HERNIA REPAIR     1950's x 2   JOINT REPLACEMENT Left 2014   LEFT HEART CATH AND CORONARY ANGIOGRAPHY Left 09/19/2005   Procedure: LEFT HEART CATH AND CORONARY ANGIOGRAPHY; Location: [facility]; Surgeon: Charlton Haws, MD   PELVIC LYMPH NODE DISSECTION Bilateral 12/22/2022   Procedure: PELVIC LYMPH NODE DISSECTION;  Surgeon: Sondra Come, MD;  Location: [facility] ORS;  Service: Urology;  Laterality: Bilateral;   PROSTATE BIOPSY     ROBOT ASSISTED LAPAROSCOPIC RADICAL PROSTATECTOMY N/A 12/22/2022   Procedure: XI ROBOTIC ASSISTED LAPAROSCOPIC RADICAL PROSTATECTOMY;   Surgeon: Sondra Come, MD;  Location: [facility] ORS;  Service: Urology;  Laterality: N/A;   thumb surgery  1980   had staph infection and had to cut out the area   TOTAL HIP ARTHROPLASTY Left 2014   Dr. Hyacinth Meeker   TOTAL HIP ARTHROPLASTY Right 05/04/2019   Procedure: TOTAL HIP ARTHROPLASTY ANTERIOR APPROACH;  Surgeon: Lyndle Herrlich, MD;  Location: [facility] ORS;  Service: Orthopedics;  Laterality: Right;   Family History  Problem Relation Age of Onset   Hypertension Mother    Heart attack Father    Melanoma Father    Prostate cancer Neg Hx    Bladder Cancer Neg Hx    Kidney cancer Neg Hx    Social History   Socioeconomic History   Marital status: Married    Spouse name: [name]   Number of children: 2   Years of education: Not on file   Highest education level: Some college, no degree  Occupational History   Occupation: Retired    Comment: Still drives school  bus  Tobacco Use   Smoking status: Former    Current packs/day: 0.00    Average packs/day: 2.0 packs/day for 15.0 years (30.0 ttl pk-yrs)    Types: Cigarettes    Start date: 11/13/1994    Quit date: 11/12/2009    Years since quitting: 13.6   Smokeless tobacco: Never   Tobacco comments:    Pt states he quit around 2000?  Vaping Use   Vaping status: Never Used  Substance and Sexual Activity   Alcohol use: Yes    Alcohol/week: 4.0 - 8.0 standard drinks of alcohol    Types: 4 - 8 Cans of beer per week    Comment: 1-2 beers 4xs a week   Drug use: No   Sexual activity: Not Currently    Birth control/protection: None  Other Topics Concern   Not on file  Social History Narrative   2 biological children and 2 step-children   Social Determinants of Health   Financial Resource Strain: Low Risk  (07/04/2023)   Overall Financial Resource Strain (CARDIA)    Difficulty of Paying Living Expenses: Not hard at all  Food Insecurity: No Food Insecurity (07/04/2023)   Hunger Vital Sign    Worried About Running Out of Food in the  Last Year: Never true    Ran Out of Food in the Last Year: Never true  Transportation Needs: No Transportation Needs (07/04/2023)   PRAPARE - Administrator, Civil Service (Medical): No    Lack of Transportation (Non-Medical): No  Physical Activity: Insufficiently Active (07/04/2023)   Exercise Vital Sign    Days of Exercise per Week: 3 days    Minutes of Exercise per Session: 10 min  Stress: No Stress Concern Present (07/04/2023)   [employer] of Occupational Health - Occupational Stress Questionnaire    Feeling of Stress : Not at all  Social Connections: Moderately Integrated (07/04/2023)   Social Connection and Isolation Panel [NHANES]    Frequency of Communication with Friends and Family: Three times a week    Frequency of Social Gatherings with Friends and Family: More than three times a week    Attends Religious Services: 1 to 4 times per year    Active Member of [employer] or Organizations: No    Attends Banker Meetings: Patient declined    Marital Status: Married    Tobacco Counseling Counseling given: Not Answered Tobacco comments: Pt states he quit around 2000?   Clinical Intake:  Pre-visit preparation completed: Yes  Pain : No/denies pain     BMI - recorded: 35.26 Nutritional Status: BMI > 30  Obese Nutritional Risks: None Diabetes: No  How often do you need to have someone help you when you read instructions, pamphlets, or other written materials from your doctor or pharmacy?: 1 - Never  Interpreter Needed?: No  Information entered by :: Tora Kindred, CMA   Activities of Daily Living    07/04/2023    9:00 AM 12/22/2022    6:00 PM  In your present state of health, do you have any difficulty performing the following activities:  Hearing? 0   Vision? 0   Difficulty concentrating or making decisions? 0   Walking or climbing stairs? 0   Dressing or bathing? 0   Doing errands, shopping? 0 0  Preparing Food and eating ? N   Using  the Toilet? N   In the past six months, have you accidently leaked urine? Y   Comment prostectomy 12/12/22, under  control, pad if needed   Do you have problems with loss of bowel control? N   Managing your Medications? N   Managing your Finances? N   Housekeeping or managing your Housekeeping? N     Patient Care Team: [name], MD as PCP - General (Family Medicine) Marinus Maw, MD as PCP - Cardiology (Cardiology) Marinus Maw, MD as Consulting Physician (Cardiology) Deeann Saint, MD (Orthopedic Surgery) Lyndle Herrlich, MD as Consulting Physician (Orthopedic Surgery) Sondra Come, MD as Consulting Physician (Urology) Marlene Bast (Optometry) [name], MD as Referring Physician (Family Medicine)  Indicate any recent Medical Services you may have received from other than [facility] providers in the past year (date may be approximate).     Assessment:   This is a routine wellness examination for [facility].  Hearing/Vision screen Hearing Screening - Comments:: Denies hearing loss Vision Screening - Comments:: Gets eye exams   Goals Addressed               This Visit's Progress     Weight (lb) < 240 lb (108.9 kg) (pt-stated)   260 lb (117.9 kg)     Lose 20 lbs      Depression Screen    07/08/2023   11:26 AM 03/02/2023   11:04 AM 07/15/2022    9:16 AM 02/17/2022    8:47 AM 03/27/2021    8:26 AM 08/14/2020    9:46 AM 02/15/2020    8:39 AM  PHQ 2/9 Scores  PHQ - 2 Score 0 0 0 0 0 0 0  PHQ- 9 Score 0 0 0 0 0 0     Fall Risk    07/04/2023    9:00 AM 03/02/2023   11:04 AM 07/15/2022    9:16 AM 02/17/2022    8:50 AM 02/13/2022   10:15 AM  Fall Risk   Falls in the past year? 0  0 0 0  Number falls in past yr: 0 0 0 0 0  Injury with Fall? 0 0 0 0 0  Risk for fall due to : No Fall Risks  No Fall Risks No Fall Risks   Follow up Falls prevention discussed  Falls evaluation completed Falls evaluation completed     MEDICARE RISK AT HOME: Medicare  Risk at Home Any stairs in or around the home?: Yes If so, are there any without handrails?: No Home free of loose throw rugs in walkways, pet beds, electrical cords, etc?: Yes Adequate lighting in your home to reduce risk of falls?: Yes Life alert?: No Use of a cane, walker or w/c?: No Grab bars in the bathroom?: Yes Shower chair or bench in shower?: Yes Elevated toilet seat or a handicapped toilet?: Yes  TIMED UP AND GO:  Was the test performed?  No    Cognitive Function:        07/08/2023   11:32 AM 06/06/2020   10:27 AM 03/03/2017   10:43 AM  6CIT Screen  What Year? 0 points 0 points 0 points  What month? 0 points 0 points 0 points  What time? 0 points 0 points 0 points  Count back from 20 0 points 0 points 0 points  Months in reverse 0 points 0 points 0 points  Repeat phrase 0 points 0 points 0 points  Total Score 0 points 0 points 0 points    Immunizations Immunization History  Administered Date(s) Administered   Influenza-Unspecified 04/14/2017, 04/01/2018   Moderna Sars-Covid-2 Vaccination 10/13/2019,  11/10/2019   Pneumococcal Conjugate-13 04/14/2014   Pneumococcal Polysaccharide-23 01/10/2016   Tdap 04/14/2014    TDAP status: Up to date  Flu Vaccine status: Declined, Education has been provided regarding the importance of this vaccine but patient still declined. Advised may receive this vaccine at local pharmacy or Health Dept. Aware to provide a copy of the vaccination record if obtained from local pharmacy or Health Dept. Verbalized acceptance and understanding.  Pneumococcal vaccine status: Up to date  Covid-19 vaccine status: Declined, Education has been provided regarding the importance of this vaccine but patient still declined. Advised may receive this vaccine at local pharmacy or Health Dept.or vaccine clinic. Aware to provide a copy of the vaccination record if obtained from local pharmacy or Health Dept. Verbalized acceptance and  understanding.  Qualifies for Shingles Vaccine? Yes   Zostavax completed No   Shingrix Completed?: No.    Education has been provided regarding the importance of this vaccine. Patient has been advised to call insurance company to determine out of pocket expense if they have not yet received this vaccine. Advised may also receive vaccine at local pharmacy or Health Dept. Verbalized acceptance and understanding.  Screening Tests Health Maintenance  Topic Date Due   Zoster Vaccines- Shingrix (1 of 2) Never done   Lung Cancer Screening  Never done   COVID-19 Vaccine (3 - Moderna risk series) 12/08/2019   INFLUENZA VACCINE  04/02/2023   DTaP/Tdap/Td (2 - Td or Tdap) 04/14/2024   Medicare Annual Wellness (AWV)  07/07/2024   Colonoscopy  06/18/2027   Pneumonia Vaccine 61+ Years old  Completed   Hepatitis C Screening  Completed   HPV VACCINES  Aged Out    Health Maintenance  Health Maintenance Due  Topic Date Due   Zoster Vaccines- Shingrix (1 of 2) Never done   Lung Cancer Screening  Never done   COVID-19 Vaccine (3 - Moderna risk series) 12/08/2019   INFLUENZA VACCINE  04/02/2023    Colorectal cancer screening: Type of screening: Colonoscopy. Completed 06/14/22. Repeat every 5 years  Lung Cancer Screening: (Low Dose CT Chest recommended if Age 36-80 years, 20 pack-year currently smoking OR have quit w/in 15years.) does qualify. Had PET CT scan 11/03/22 that included chest  Lung Cancer Screening Referral: n/a  Additional Screening:  Hepatitis C Screening: does not qualify; Completed 01/10/16  Vision Screening: Recommended annual ophthalmology exams for early detection of glaucoma and other disorders of the eye.  Dental Screening: Recommended annual dental exams for proper oral hygiene   Community Resource Referral / Chronic Care Management: CRR required this visit?  No   CCM required this visit?  No     Plan:     I have personally reviewed and noted the following in the  patient's chart:   Medical and social history Use of alcohol, tobacco or illicit drugs  Current medications and supplements including opioid prescriptions. Patient is not currently taking opioid prescriptions. Functional ability and status Nutritional status Physical activity Advanced directives List of other physicians Hospitalizations, surgeries, and ER visits in previous 12 months Vitals Screenings to include cognitive, depression, and falls Referrals and appointments  In addition, I have reviewed and discussed with patient certain preventive protocols, quality metrics, and best practice recommendations. A written personalized care plan for preventive services as well as general preventive health recommendations were provided to patient.     Tora Kindred, CMA   07/08/2023   After Visit Summary: (MyChart) Due to this being a telephonic visit,  the after visit summary with patients personalized plan was offered to patient via MyChart   Nurse Notes:  Patient declined flu, covid and shingles vaccines. Had PET CT scan 11/03/22 that included chest (not due for LDCT until 2025)

## 2023-07-08 NOTE — Telephone Encounter (Signed)
Copied from CRM [id number]. Topic: Appointment Scheduling - Scheduling Inquiry for Clinic >> Jul 08, 2023  9:26 AM [place] B wrote: Reason for CRM: pt called in didn't want to reschedule AWV

## 2023-07-08 NOTE — Patient Instructions (Addendum)
Steven Saunders , Thank you for taking time to come for your Medicare Wellness Visit. I appreciate your ongoing commitment to your health goals. Please review the following plan we discussed and let me know if I can assist you in the future.   Referrals/Orders/Follow-Ups/Clinician Recommendations: I did look back at your chart and saw that you had a PET CT scan on 11/03/22 which included the chest. You do not need a low dose lung cancer screening CT until 10/2023. Keep up the good work!  This is a list of the screening recommended for you and due dates:  Health Maintenance  Topic Date Due   Zoster (Shingles) Vaccine (1 of 2) Never done   Screening for Lung Cancer  Never done   COVID-19 Vaccine (3 - Moderna risk series) 12/08/2019   Flu Shot  11/30/2023*   DTaP/Tdap/Td vaccine (2 - Td or Tdap) 04/14/2024   Medicare Annual Wellness Visit  07/07/2024   Colon Cancer Screening  06/18/2027   Pneumonia Vaccine  Completed   Hepatitis C Screening  Completed   HPV Vaccine  Aged Out  *Topic was postponed. The date shown is not the original due date.    Advanced directives: (In Chart) A copy of your advanced directives are scanned into your chart should your provider ever need it.  Next Medicare Annual Wellness Visit scheduled for next year: Yes, 07/13/24 @ 9:45am

## 2023-07-28 DIAGNOSIS — H353221 Exudative age-related macular degeneration, left eye, with active choroidal neovascularization: Secondary | ICD-10-CM | POA: Diagnosis not present

## 2023-09-08 DIAGNOSIS — H353221 Exudative age-related macular degeneration, left eye, with active choroidal neovascularization: Secondary | ICD-10-CM | POA: Diagnosis not present

## 2023-10-15 ENCOUNTER — Other Ambulatory Visit: Payer: PPO

## 2023-10-15 DIAGNOSIS — C61 Malignant neoplasm of prostate: Secondary | ICD-10-CM

## 2023-10-16 ENCOUNTER — Other Ambulatory Visit: Payer: Self-pay

## 2023-10-16 LAB — PSA: Prostate Specific Ag, Serum: <0.1 ng/mL (ref 0.0–4.0)

## 2023-10-22 ENCOUNTER — Ambulatory Visit: Payer: PPO | Admitting: Urology

## 2023-10-22 VITALS — BP 131/81 | HR 81 | Ht 72.0 in | Wt 265.0 lb

## 2023-10-22 DIAGNOSIS — Z8546 Personal history of malignant neoplasm of prostate: Secondary | ICD-10-CM

## 2023-10-22 DIAGNOSIS — N529 Male erectile dysfunction, unspecified: Secondary | ICD-10-CM

## 2023-10-22 DIAGNOSIS — C61 Malignant neoplasm of prostate: Secondary | ICD-10-CM

## 2023-10-22 MED ORDER — SILDENAFIL CITRATE 100 MG PO TABS: 100.0000 mg | ORAL_TABLET | Freq: Every day | ORAL | 6 refills | Status: AC | PRN

## 2023-10-22 NOTE — Progress Notes (Signed)
   10/22/2023 9:53 AM   Steven Saunders. [birth year] [id number]  Reason for visit: Follow up prostate cancer status post radical prostatectomy, ED  HPI: 73 year old male with long history of favorable intermediate risk prostate cancer who was previously managed by a different urologist and had opted for active surveillance.  In the setting of a persistently rising PSA(11.0) we ordered a prostate MRI in January 2024 which showed a PI-RADS 5 lesion in the right anterior lateral base and some irregular appearance alongside the capsule concerning for extracapsular extension and SV involvement and he ultimately opted for surgery.   He underwent robotic radical prostatectomy and bilateral pelvic lymph node dissection on 12/22/2022.  Pathology results showed negative lymph nodes, 80% of prostate involved with grade group 2 Gleason score 3+4=7 disease including a positive margin at the bladder neck.    Initial postop PSA in June 2024 was undetectable, and remains undetectable on most recent PSA from 10/15/2023.  Overall, he is doing well.  He is voiding about every 2-3 hours during the day, and 3 times overnight which is similar to prior to surgery.  He has minimal leakage and is wearing a pad more for safety, very mild stress incontinence with coughing or sneezing.  Satisfied with urinary symptoms at this time.  Continues to have problems with ED postsurgery, nerve spare was not performed with his PI-RADS 5 lesion and concern for extracapsular extension.  He was using sildenafil 100 mg on demand prior to surgery.  He has been using 10 mg Cialis daily without any significant improvement.  He would like to try the 100 mg sildenafil again.  We discussed other options for ED including penile injections or penile prosthesis, but he is not interested at this time.  Trial of sildenafil 100 mg on demand for ED, he is not interested in other treatment options like penile injections or prosthesis RTC PSA lab visit  4 months, if remains undetectable RTC in person ~December 2025 with PSA prior  Sondra Come, MD  [facility] [address], [address] [place], [place] [address] [phone]

## 2023-10-23 DIAGNOSIS — H353221 Exudative age-related macular degeneration, left eye, with active choroidal neovascularization: Secondary | ICD-10-CM | POA: Diagnosis not present

## 2023-10-28 NOTE — Progress Notes (Unsigned)
 Electrophysiology Clinic Note    Date:  10/29/2023  Patient ID:  Steven Eskew., DOB Jan 06, 1951, MRN [id number] PCP:  [name], MD  Cardiologist:  None Electrophysiologist: Lewayne Bunting, MD   Discussed the use of AI scribe software for clinical note transcription with the patient, who gave verbal consent to proceed.   Patient Profile    Chief Complaint: Afib follow-up  History of Present Illness: Steven Allcock. is a 73 y.o. male with PMH notable for Afib, HTN, prostate Ca seen today for Lewayne Bunting, MD for routine electrophysiology followup.  He last saw Dr. Ladona Ridgel 10/2022, was in AFib at that appt. He refused OAC, he does take 81mg  ASA.  On follow-up today, he is overall doing well. He denies chest pain, chest pressure, palpitations. He does have SOB with walking distances - had it with walking from parking spot to clinic. This is not new or worsening. His activity is also limited by knee pain, he thinks it's arthritis.  He does not check his BP regulary, but recalls home readings in 140-150s systolic. He has to complete yearly DOT physicals and states that he struggles to keep his BP in allowable range to pass.  He continues to take 81mg  ASA, no bleeding concerns. He is interested in switching to [facility] for stroke ppx.   No dizziness, syncope or presyncope.    AAD History: None     ROS:  Please see the history of present illness. All other systems are reviewed and otherwise negative.    Physical Exam    VS:  BP (!) 144/95 (BP Location: Left Arm, Patient Position: Sitting, Cuff Size: Large)   Pulse (!) 52   Ht 6' (1.829 m)   Wt 267 lb 4 oz (121.2 kg)   SpO2 99%   BMI 36.25 kg/m  BMI: Body mass index is 36.25 kg/m.  Manual pulse check = 64  Wt Readings from Last 3 Encounters:  10/29/23 267 lb 4 oz (121.2 kg)  10/22/23 265 lb (120.2 kg)  07/08/23 260 lb (117.9 kg)     GEN- The patient is well appearing, alert and oriented x 3 today.   Lungs-  Clear to ausculation bilaterally, normal work of breathing.  Heart- Irregularly irregular rate and rhythm, no murmurs, rubs or gallops Extremities- No peripheral edema, warm, dry    Studies Reviewed   Previous EP, cardiology notes.    EKG is ordered. Personal review of EKG from today shows:    EKG Interpretation Date/Time:  Thursday October 29 2023 09:31:33 EST Ventricular Rate:  52 PR Interval:    QRS Duration:  142 QT Interval:  452 QTC Calculation: 420 R Axis:   24  Text Interpretation: Atrial fibrillation with slow ventricular response Right bundle branch block Confirmed by Sherie Don [phone]) on 10/29/2023 9:33:09 AM    TTE, 02/03/2014 - Left ventricle: The cavity size was normal. Wall thickness was increased in a pattern of mild LVH. Systolic function was normal. The estimated ejection fraction was in the range of 50% to 55%. Wall motion was normal; there were no regional wall motion abnormalities.  - Aortic valve: There was mild regurgitation.  - Mitral valve: There was mild regurgitation.  - Left atrium: The atrium was severely dilated.  - Right ventricle: The cavity size was mildly dilated.  - Right atrium: The atrium was moderately dilated.     Assessment and Plan     #) long-standing persis AFib #) SOB Patient  is overall asymptomatic of his arrhythmia. He does have some stable SOB, unsure if this is related to rhythm.  Ventricular rate well-controlled Update TTE to confirm preserved LVEF and eval atrial size. Consider AAD vs ablation Continue 25mg  coreg Continue 0.115mcg dig daily Will update dig level, recommended to hold morning dose and obtain lab work. Resume dig after labwork complete   #) Hypercoag d/t persis afib CHA2DS2-VASc Score = at least 2 [CHF History: 0, HTN History: 1, Diabetes History: 0, Stroke History: 0, Vascular Disease History: 0, Age Score: 1, Gender Score: 0].  Therefore, the patient's annual risk of stroke is 2.2 %.    Stroke ppx -  will start 5mg  eliquis BID starting tomorrow given that he has taken 81mg  ASA today Office samples provided, he is to notify office if medication is unaffordable Update CBC  #) HTN Elevated readings in office and by home measurements Continue coreg as above Continue 25mg  spiro Increase irbesartan to 300mg  daily Update BMP in 7-10 days  Recommended to increase physical activity including walking       Current medicines are reviewed at length with the patient today.   The patient has concerns regarding his medicines.  The following changes were made today:   STOP aspirin START 5mg  eliquis BID INCREASE irbesartan to 300mg  daily  Labs/ tests ordered today include:  Orders Placed This Encounter  Procedures   Basic Metabolic Panel (BMET)   CBC   Digoxin level   EKG 12-Lead   ECHOCARDIOGRAM COMPLETE     Disposition: Follow up with Dr. Ladona Ridgel or EP APP in 6 months   Signed, Sherie Don, NP  10/29/23  2:21 PM  Electrophysiology [facility]

## 2023-10-29 ENCOUNTER — Ambulatory Visit: Payer: PPO | Attending: Cardiology | Admitting: Cardiology

## 2023-10-29 ENCOUNTER — Encounter: Payer: Self-pay | Admitting: Cardiology

## 2023-10-29 VITALS — BP 144/95 | HR 52 | Ht 72.0 in | Wt 267.2 lb

## 2023-10-29 DIAGNOSIS — I4891 Unspecified atrial fibrillation: Secondary | ICD-10-CM

## 2023-10-29 DIAGNOSIS — I10 Essential (primary) hypertension: Secondary | ICD-10-CM | POA: Diagnosis not present

## 2023-10-29 DIAGNOSIS — D6869 Other thrombophilia: Secondary | ICD-10-CM | POA: Diagnosis not present

## 2023-10-29 MED ORDER — IRBESARTAN 300 MG PO TABS: 300.0000 mg | ORAL_TABLET | Freq: Every day | ORAL | 3 refills | Status: AC

## 2023-10-29 MED ORDER — APIXABAN 5 MG PO TABS: 5.0000 mg | ORAL_TABLET | Freq: Two times a day (BID) | ORAL | Status: DC

## 2023-10-29 MED ORDER — APIXABAN 5 MG PO TABS: 5.0000 mg | ORAL_TABLET | Freq: Two times a day (BID) | ORAL | 3 refills | Status: DC

## 2023-10-29 NOTE — Patient Instructions (Addendum)
 Medication Instructions:   Your physician recommends the following medication changes.  STOP TAKING:  Aspirin 81 mg  START TAKING:  Eliquis 5 mg. Take one tablet in the AM, and one tablet in the PM.   INCREASE:   Increase Irbesartan 300mg   Take two of the 150mg  tablet until you run out, and then pickup the 300mg  tablets that are at the pharmacy.  *If you need a refill on your cardiac medications before your next appointment, please call your pharmacy*   Lab Work:  Your provider would like for you to return in 1- 2 weeks  to have the following labs drawn: CBC, BMET, Digoxin level.   Please go to [facility]  [address] ([facility]) [address], [place] [address] You do not need an appointment.  They are open from 8 am- 4:30 pm.  Lunch from 1:00 pm- 2:00 pm You do need to be fasting.   You may also go to one of the following [facility]:  [address] [place], [place] [address] Phone: [phone] Lab hours: Mon-Fri 8 am- 5 pm    Lunch 12 pm- 1 pm  [address] [place],  [place]  [address]  [place] Phone: [phone] Lab hours: 7 am- 4 pm Lunch 12 pm-1 pm   [address] [place],  [place]  [address]  [place] Phone: [phone] Lab hours: Mon-Fri 8 am- 5 pm    Lunch 12 pm- 1 pm  If you have labs (blood work) drawn today and your tests are completely normal, you will receive your results only by: MyChart Message (if you have MyChart) OR A paper copy in the mail If you have any lab test that is abnormal or we need to change your treatment, we will call you to review the results.   Testing/Procedures:  Your physician has requested that you have an echocardiogram. Echocardiography is a painless test that uses sound waves to create images of your heart. It provides your doctor with information about the size and shape of your heart and how well your heart's chambers and valves are working.   You may receive an ultrasound enhancing agent through an IV if  needed to better visualize your heart during the echo. This procedure takes approximately one hour.  There are no restrictions for this procedure.  This will take place at [address] [facility]) [address], [place] [address]  Please note: We ask at that you not bring children with you during ultrasound (echo/ vascular) testing. Due to room size and safety concerns, children are not allowed in the ultrasound rooms during exams. Our front office staff cannot provide observation of children in our lobby area while testing is being conducted. An adult accompanying a patient to their appointment will only be allowed in the ultrasound room at the discretion of the ultrasound technician under special circumstances. We apologize for any inconvenience.    Follow-Up: At [facility], you and your health needs are our priority.  As part of our continuing mission to provide you with exceptional heart care, we have created designated Provider Care Teams.  These Care Teams include your primary Cardiologist (physician) and Advanced Practice Providers (APPs -  Physician Assistants and Nurse Practitioners) who all work together to provide you with the care you need, when you need it.   Your next appointment:   6 month(s)  Provider:   You will see one of the following Advanced Practice Providers on your designated Care Team:  Sherie Don NP    Other Instructions We would like for you to keep a log of your Blood pressure for the foreseeable future.  Log has been provided

## 2023-11-09 ENCOUNTER — Ambulatory Visit: Payer: PPO | Admitting: Student

## 2023-11-10 DIAGNOSIS — I10 Essential (primary) hypertension: Secondary | ICD-10-CM | POA: Diagnosis not present

## 2023-11-10 DIAGNOSIS — I4891 Unspecified atrial fibrillation: Secondary | ICD-10-CM | POA: Diagnosis not present

## 2023-11-10 DIAGNOSIS — D6869 Other thrombophilia: Secondary | ICD-10-CM | POA: Diagnosis not present

## 2023-11-11 LAB — CBC
Hematocrit: 46.3 % (ref 37.5–51.0)
Hemoglobin: 15.4 g/dL (ref 13.0–17.7)
MCH: 33 pg (ref 26.6–33.0)
MCHC: 33.3 g/dL (ref 31.5–35.7)
MCV: 99 fL — ABNORMAL HIGH (ref 79–97)
Platelets: 155 10*3/uL (ref 150–450)
RBC: 4.66 x10E6/uL (ref 4.14–5.80)
RDW: 12.5 % (ref 11.6–15.4)
WBC: 7.4 10*3/uL (ref 3.4–10.8)

## 2023-11-11 LAB — BASIC METABOLIC PANEL
BUN/Creatinine Ratio: 13 (ref 10–24)
BUN: 12 mg/dL (ref 8–27)
CO2: 25 mmol/L (ref 20–29)
Calcium: 9.8 mg/dL (ref 8.6–10.2)
Chloride: 94 mmol/L — ABNORMAL LOW (ref 96–106)
Creatinine, Ser: 0.9 mg/dL (ref 0.76–1.27)
Glucose: 165 mg/dL — ABNORMAL HIGH (ref 70–99)
Potassium: 5.1 mmol/L (ref 3.5–5.2)
Sodium: 132 mmol/L — ABNORMAL LOW (ref 134–144)
eGFR: 90 mL/min/{1.73_m2} (ref >59)

## 2023-11-11 LAB — DIGOXIN LEVEL: Digoxin, Serum: 0.5 ng/mL (ref 0.5–0.9)

## 2023-11-11 NOTE — Progress Notes (Signed)
 Kidney function stable. Blood counts stable, dig level stable.   Just to confirm, he is taking losartan, and skipped morning digoxin on the day he obtained the labwork? How has BP been running at home with the addition of losartan?

## 2023-11-25 ENCOUNTER — Ambulatory Visit: Payer: PPO | Attending: Cardiology

## 2023-11-25 DIAGNOSIS — I4891 Unspecified atrial fibrillation: Secondary | ICD-10-CM

## 2023-11-25 LAB — ECHOCARDIOGRAM COMPLETE
AR max vel: 2.08 cm2
AV Area VTI: 2.16 cm2
AV Area mean vel: 1.86 cm2
AV Mean grad: 7.5 mmHg
AV Peak grad: 14.4 mmHg
Ao pk vel: 1.9 m/s
S' Lateral: 3.4 cm

## 2023-11-26 ENCOUNTER — Telehealth: Payer: Self-pay | Admitting: Family Medicine

## 2023-11-26 MED ORDER — CARVEDILOL 25 MG PO TABS: 25.0000 mg | ORAL_TABLET | Freq: Two times a day (BID) | ORAL | 4 refills | Status: AC

## 2023-11-26 NOTE — Telephone Encounter (Signed)
 [facility] faxed refill request for the following medications:   carvedilol (COREG) 25 MG tablet      Please advise.

## 2023-12-03 ENCOUNTER — Other Ambulatory Visit: Payer: Self-pay | Admitting: Family Medicine

## 2023-12-03 NOTE — Progress Notes (Unsigned)
 Electrophysiology Clinic Note    Date:  12/04/2023  Patient ID:  Steven Saunders., DOB [birth year], MRN [id number] PCP:  [name], MD  Cardiologist:  None Electrophysiologist: Lewayne Bunting, MD   Discussed the use of AI scribe software for clinical note transcription with the patient, who gave verbal consent to proceed.   Patient Profile    Chief Complaint: Afib follow-up  History of Present Illness: Steven Saunders. is a 73 y.o. male with PMH notable for perm Afib, HTN, prostate Ca seen today for Lewayne Bunting, MD for electrophysiology followup.   I last saw him 10/2023 where he was having stable SOB and HTN. Remained in afib. He was interested in switching from ASA > Eliquis. I ordered updated TTE to further eval SOB.  On follow-up today, he feels about the same as at our previous appt. He is tolerating eliquis well without bleeding concerns. He brings BP log, BP has improved by his report. Readings are usually 130-140, rarely 120s, never lower than 120.   He brings his wife today to discuss his recent TTE results.   He continues to have DOE, denies chest pain, chest pressure, palpitations. No dizziness, LH, lower extremity edema.     AAD History: None     ROS:  Please see the history of present illness. All other systems are reviewed and otherwise negative.    Physical Exam    VS:  BP 138/70 (BP Location: Left Arm, Patient Position: Sitting, Cuff Size: Normal)   Pulse (!) 58   Ht 6' (1.829 m)   Wt 267 lb 6 oz (121.3 kg)   SpO2 98%   BMI 36.26 kg/m  BMI: Body mass index is 36.26 kg/m.   Wt Readings from Last 3 Encounters:  12/04/23 267 lb 6 oz (121.3 kg)  10/29/23 267 lb 4 oz (121.2 kg)  10/22/23 265 lb (120.2 kg)     GEN- The patient is well appearing, alert and oriented x 3 today.   Lungs- Clear to ausculation bilaterally, normal work of breathing.  Heart- Irregularly irregular rate and rhythm, no murmurs, rubs or gallops Extremities- No  peripheral edema, warm, dry    Studies Reviewed   Previous EP, cardiology notes.    EKG is ordered. Personal review of EKG from today shows:    EKG Interpretation Date/Time:  Friday December 04 2023 10:14:15 EDT Ventricular Rate:  58 PR Interval:    QRS Duration:  138 QT Interval:  440 QTC Calculation: 431 R Axis:   27  Text Interpretation: Atrial fibrillation with slow ventricular response Right bundle branch block Confirmed by Sherie Don [phone]) on 12/04/2023 10:15:12 AM    TTE, 11/25/2023 1. Left ventricular ejection fraction, by estimation, is 45 to 50%. Left ventricular ejection fraction by 3D volume is 45 %. The left ventricle has mildly decreased function. The left ventricle has no regional wall motion abnormalities. There is mild left ventricular hypertrophy. Left ventricular diastolic parameters are indeterminate. The average left ventricular global longitudinal strain is -18.2 %. The global longitudinal strain is normal.   2. Right ventricular systolic function is mildly reduced. The right ventricular size is moderately enlarged.   3. Left atrial size was severely dilated.   4. Right atrial size was severely dilated.   5. The mitral valve is normal in structure. Moderate mitral valve regurgitation. No evidence of mitral stenosis.   6. The aortic valve is normal in structure. There is mild calcification of the aortic  valve. Aortic valve regurgitation is mild to moderate. Aortic valve sclerosis/calcification is present, without any evidence of aortic stenosis. Aortic valve mean gradient measures 7.5 mmHg.   7. There is mild dilatation of the aortic root, measuring 43 mm. There is mild dilatation of the ascending aorta, measuring 41 mm.   8. The inferior vena cava is normal in size with greater than 50% respiratory variability, suggesting right atrial pressure of 3 mmHg.   TTE, 02/03/2014 - Left ventricle: The cavity size was normal. Wall thickness was increased in a pattern of mild  LVH. Systolic function was normal. The estimated ejection fraction was in the range of 50% to 55%. Wall motion was normal; there were no regional wall motion abnormalities.  - Aortic valve: There was mild regurgitation.  - Mitral valve: There was mild regurgitation.  - Left atrium: The atrium was severely dilated.  - Right ventricle: The cavity size was mildly dilated.  - Right atrium: The atrium was moderately dilated.     Assessment and Plan    #) DOE #) HFmrEF Newly diagnosed LVEF at 45-50%, previously 50-55%. No WMA, mild LVH AFib Ventricular rates have been well-controlled Recommend ischemic evaluation with PET stress, which patient and wife are agreeable to Recommend he establish with general cardiology after Pet Stress for further GDMT adjustment, ischemic eval (if needed)   #) HTN Continues to be above goal  Continue coreg 25mg  BID Continue 25mg  spiro Continue irbesartan 300mg  daily Recent labs stable Continue lifestyle modifications including reduced dietary sodium and regular physical activity  #) perm AFib Ventricular rate well-controlled Recent dig level 0.5 Continue 25mg  coreg Continue 0.168mcg digoxin daily  #) Hypercoag d/t persis afib CHA2DS2-VASc Score = at least 2 [CHF History: 0, HTN History: 1, Diabetes History: 0, Stroke History: 0, Vascular Disease History: 0, Age Score: 1, Gender Score: 0].  Therefore, the patient's annual risk of stroke is 2.2 %.    Stroke ppx - 5mg  eliquis BID, appropriately dosed No bleeding concerns   #) dilated aortic root #) ascending aortic aneurysm Recently diagnosed on TTE with aortic root to 44mm, and ascending aorta to 41mm Anticipate CT aorta after ischemic eval as above      Informed Consent   Shared Decision Making/Informed Consent The risks [chest pain, shortness of breath, cardiac arrhythmias, dizziness, blood pressure fluctuations, myocardial infarction, stroke/transient ischemic attack, nausea, vomiting,  allergic reaction, radiation exposure, metallic taste sensation and life-threatening complications (estimated to be 1 in 10,000)], benefits (risk stratification, diagnosing coronary artery disease, treatment guidance) and alternatives of a cardiac PET stress test were discussed in detail with Mr. Wilson and he agrees to proceed.       Current medicines are reviewed at length with the patient today.   The patient has concerns regarding his medicines.  The following changes were made today:   none  Labs/ tests ordered today include:  Orders Placed This Encounter  Procedures   NM PET CT CARDIAC PERFUSION MULTI W/ABSOLUTE BLOODFLOW   Ambulatory referral to Cardiology   EKG 12-Lead     Disposition: Follow up with Dr. Ladona Ridgel or EP APP in 12 months, sooner if needed  PET stress at next available, appt with general cardiology after stress   Signed, Sherie Don, NP  12/04/23  11:30 AM  Electrophysiology [facility]

## 2023-12-04 ENCOUNTER — Encounter: Payer: Self-pay | Admitting: Cardiology

## 2023-12-04 ENCOUNTER — Ambulatory Visit: Attending: Cardiology | Admitting: Cardiology

## 2023-12-04 VITALS — BP 138/70 | HR 58 | Ht 72.0 in | Wt 267.4 lb

## 2023-12-04 DIAGNOSIS — I10 Essential (primary) hypertension: Secondary | ICD-10-CM | POA: Diagnosis not present

## 2023-12-04 DIAGNOSIS — I5022 Chronic systolic (congestive) heart failure: Secondary | ICD-10-CM

## 2023-12-04 DIAGNOSIS — D6869 Other thrombophilia: Secondary | ICD-10-CM

## 2023-12-04 DIAGNOSIS — I4821 Permanent atrial fibrillation: Secondary | ICD-10-CM | POA: Diagnosis not present

## 2023-12-04 NOTE — Patient Instructions (Addendum)
 Medication Instructions:  Your physician recommends that you continue on your current medications as directed. Please refer to the Current Medication list given to you today.  *If you need a refill on your cardiac medications before your next appointment, please call your pharmacy*  Lab Work: No labs ordered today  If you have labs (blood work) drawn today and your tests are completely normal, you will receive your results only by: MyChart Message (if you have MyChart) OR A paper copy in the mail If you have any lab test that is abnormal or we need to change your treatment, we will call you to review the results.  Testing/Procedures:   Please report to Radiology at [facility] Main Entrance, medical mall, 30 mins prior to your test.  [address]  [place], [place]  How to Prepare for Your Cardiac PET/CT Stress Test:  Nothing to eat or drink, except water, 3 hours prior to arrival time.  NO caffeine/decaffeinated products, or chocolate 12 hours prior to arrival. (Please note decaffeinated beverages (teas/coffees) still contain caffeine).  If you have caffeine within 12 hours prior, the test will need to be rescheduled.  Medication instructions: Do not take erectile dysfunction medications for 72 hours prior to test (sildenafil, tadalafil) Do not take nitrates (isosorbide mononitrate, Ranexa) the day before or day of test Do not take tamsulosin the day before or morning of test Hold theophylline containing medications for 12 hours. Hold Dipyridamole 48 hours prior to the test.  Diabetic Preparation: If able to eat breakfast prior to 3 hour fasting, you may take all medications, including your insulin. Do not worry if you miss your breakfast dose of insulin - start at your next meal. If you do not eat prior to 3 hour fast-Hold all diabetes (oral and insulin) medications. Patients who wear a continuous glucose monitor MUST remove the device prior to  scanning.  You may take your remaining medications with water.  NO perfume, cologne or lotion on chest or abdomen area.   Total time is 1 to 2 hours; you may want to bring reading material for the waiting time.  IF YOU THINK YOU MAY BE PREGNANT, OR ARE NURSING PLEASE INFORM THE TECHNOLOGIST.  In preparation for your appointment, medication and supplies will be purchased.  Appointment availability is limited, so if you need to cancel or reschedule, please call the Radiology Department Scheduler at [phone] 24 hours in advance to avoid a cancellation fee of $100.00  What to Expect When you Arrive:  Once you arrive and check in for your appointment, you will be taken to a preparation room within the Radiology Department.  A technologist or Nurse will obtain your medical history, verify that you are correctly prepped for the exam, and explain the procedure.  Afterwards, an IV will be started in your arm and electrodes will be placed on your skin for EKG monitoring during the stress portion of the exam. Then you will be escorted to the PET/CT scanner.  There, staff will get you positioned on the scanner and obtain a blood pressure and EKG.  During the exam, you will continue to be connected to the EKG and blood pressure machines.  A small, safe amount of a radioactive tracer will be injected in your IV to obtain a series of pictures of your heart along with an injection of a stress agent.    After your Exam:  It is recommended that you eat a meal and drink a caffeinated beverage  to counter act any effects of the stress agent.  Drink plenty of fluids for the remainder of the day and urinate frequently for the first couple of hours after the exam.  Your doctor will inform you of your test results within 7-10 business days.  For more information and frequently asked questions, please visit our website: [email or web address]  For questions about your test or how to prepare for your  test, please call: Cardiac Imaging Nurse Navigators Office: [phone]   Follow-Up: At [facility], you and your health needs are our priority.  As part of our continuing mission to provide you with exceptional heart care, our providers are all part of one team.  This team includes your primary Cardiologist (physician) and Advanced Practice Providers or APPs (Physician Assistants and Nurse Practitioners) who all work together to provide you with the care you need, when you need it.  Your next appointment:   6 month(s)  Provider:   Lorine Bears, MD, Yvonne Kendall, MD, or Sherie Don, NP     Other Instructions  Referral to General cardiology with Dr End or Dr Kirke Corin next available appointment

## 2023-12-21 DIAGNOSIS — H353221 Exudative age-related macular degeneration, left eye, with active choroidal neovascularization: Secondary | ICD-10-CM | POA: Diagnosis not present

## 2023-12-31 ENCOUNTER — Other Ambulatory Visit: Payer: Self-pay | Admitting: Family Medicine

## 2023-12-31 ENCOUNTER — Telehealth: Payer: Self-pay | Admitting: Family Medicine

## 2023-12-31 DIAGNOSIS — I10 Essential (primary) hypertension: Secondary | ICD-10-CM

## 2023-12-31 MED ORDER — SPIRONOLACTONE 25 MG PO TABS
25.0000 mg | ORAL_TABLET | Freq: Every day | ORAL | 0 refills | Status: DC
Start: 2023-12-31 — End: 2024-04-03

## 2023-12-31 NOTE — Telephone Encounter (Signed)
 [facility] is requesting refill spironolactone  (ALDACTONE ) 25 MG tablet  Please advise

## 2024-01-26 ENCOUNTER — Telehealth (HOSPITAL_COMMUNITY): Payer: Self-pay | Admitting: *Deleted

## 2024-01-26 NOTE — Telephone Encounter (Signed)
 Patient call about his upcoming cardiac imaging study; pt verbalizes understanding of appt date/time, parking situation and where to check in, pre-test NPO status; name and call back number provided for further questions should they arise  Chase Copping RN Navigator Cardiac Imaging [facility] [phone] office [phone] cell  Patient aware to avoid caffeine.

## 2024-01-28 ENCOUNTER — Ambulatory Visit
Admission: RE | Admit: 2024-01-28 | Discharge: 2024-01-28 | Disposition: A | Discharge status: Home / Self Care | Source: Ambulatory Visit | Attending: Cardiology | Admitting: Cardiology

## 2024-01-28 DIAGNOSIS — D6869 Other thrombophilia: Secondary | ICD-10-CM | POA: Diagnosis not present

## 2024-01-28 DIAGNOSIS — I4821 Permanent atrial fibrillation: Secondary | ICD-10-CM | POA: Diagnosis not present

## 2024-01-28 DIAGNOSIS — I10 Essential (primary) hypertension: Secondary | ICD-10-CM | POA: Diagnosis not present

## 2024-01-28 DIAGNOSIS — I110 Hypertensive heart disease with heart failure: Secondary | ICD-10-CM | POA: Diagnosis not present

## 2024-01-28 DIAGNOSIS — I5022 Chronic systolic (congestive) heart failure: Secondary | ICD-10-CM | POA: Diagnosis not present

## 2024-01-28 LAB — NM PET CT CARDIAC PERFUSION MULTI W/ABSOLUTE BLOODFLOW
MBFR: 2.39
Nuc Rest EF: 49 %
Nuc Stress EF: 54 %
Peak HR: 65 {beats}/min
Rest HR: 53 {beats}/min
Rest MBF: 0.64 ml/g/min
Rest Nuclear Isotope Dose: 25.2 mCi
SRS: 0
SSS: 0
ST Depression (mm): 0 mm
Stress MBF: 1.53 ml/g/min
Stress Nuclear Isotope Dose: 24.9 mCi
TID: 1.08

## 2024-01-28 MED ORDER — REGADENOSON 0.4 MG/5ML IV SOLN
INTRAVENOUS | Status: AC
  Filled 2024-01-28: qty 5

## 2024-01-28 MED ORDER — REGADENOSON 0.4 MG/5ML IV SOLN
0.4000 mg | Freq: Once | INTRAVENOUS | Status: AC
  Administered 2024-01-28: 0.4 mg via INTRAVENOUS
  Filled 2024-01-28: qty 5

## 2024-01-28 MED ORDER — RUBIDIUM RB82 GENERATOR (RUBYFILL)
25.0000 | PACK | Freq: Once | INTRAVENOUS | Status: AC
  Administered 2024-01-28: 24.92 via INTRAVENOUS

## 2024-01-28 MED ORDER — RUBIDIUM RB82 GENERATOR (RUBYFILL)
25.0000 | PACK | Freq: Once | INTRAVENOUS | Status: AC
  Administered 2024-01-28: 25.19 via INTRAVENOUS

## 2024-01-28 NOTE — Progress Notes (Signed)
 Patient presents for a cardiac PET stress test and tolerated procedure without incident. Patient maintained acceptable vital signs throughout the test and was offered caffeine after test.  Patient ambulated out of department with a steady gait.

## 2024-01-29 ENCOUNTER — Ambulatory Visit: Payer: Self-pay | Admitting: Cardiology

## 2024-02-02 ENCOUNTER — Ambulatory Visit: Attending: Cardiovascular Disease | Admitting: Cardiovascular Disease

## 2024-02-02 ENCOUNTER — Encounter: Payer: Self-pay | Admitting: Cardiovascular Disease

## 2024-02-02 VITALS — BP 137/78 | HR 56 | Ht 72.0 in | Wt 271.0 lb

## 2024-02-02 DIAGNOSIS — I10 Essential (primary) hypertension: Secondary | ICD-10-CM | POA: Diagnosis not present

## 2024-02-02 DIAGNOSIS — E785 Hyperlipidemia, unspecified: Secondary | ICD-10-CM | POA: Diagnosis not present

## 2024-02-02 DIAGNOSIS — I2510 Atherosclerotic heart disease of native coronary artery without angina pectoris: Secondary | ICD-10-CM

## 2024-02-02 DIAGNOSIS — I5022 Chronic systolic (congestive) heart failure: Secondary | ICD-10-CM | POA: Diagnosis not present

## 2024-02-02 DIAGNOSIS — I4821 Permanent atrial fibrillation: Secondary | ICD-10-CM

## 2024-02-02 NOTE — Progress Notes (Signed)
 Cardiology Office Note   Date:  02/02/2024   ID:  Steven Cordrey., DOB 08-07-1951, MRN [id number]  PCP:  Lamon Pillow, MD  Cardiologist:   Antionette Kirks, MD   Chief Complaint  Patient presents with   Follow-up    [facility]. Afib/HTN/CHF c/o SOB. Meds reviewed verbally with pt.      History of Present Illness: Steven Naas. is a 73 y.o. male who presents to establish cardiac vascular care.  He has been followed in our EP clinic.  He has history of permanent atrial fibrillation, essential hypertension, prostate cancer and mild cardiomyopathy. He had an echocardiogram done in March of this year which showed an EF of 45 to 50%, severe biatrial enlargement, moderate mitral regurgitation, calcified aortic valve without significant stenosis but mild to moderate AI and mildly dilated aortic root at 43 mm. He subsequently underwent cardiac PET scan which showed no evidence of ischemia with ejection fraction of 49% at rest.  CT attenuation images showed evidence of severe coronary calcifications.  He reports stable exertional dyspnea with no palpitations or chest pain.  He does not exercise on regular basis.  He currently works as a Surveyor, mining.   Past Medical History:  Diagnosis Date   Adenocarcinoma of prostate (HCC)    a.) Bx (+) Gleason 6 (8/12 cores) with PSA 5.3 in 2018; b.) Bx (+) 2019 Gleason 6 (9/12 cores) with PSA 6.3; c.) 09/2022 --> stage IIIb (cT3b N0 M0) Gleason 7 (3+4) with  PSA of 11 --> prostate MRI with signs of extracapsular extension and suspicion for seminal vesicle involvement of the right base  --> PSMA PET 11/03/2022 (+) FDG uptake right lateral gland (SUV max 13.1); no metastatic disease.   Aortic atherosclerosis (HCC)    CAD (coronary artery disease) 09/19/2005   a.) LHC 09/19/2005: 30 mLAD, 30% dLAD - med mgmt.   ED (erectile dysfunction)    a.) on PDE5i (sildenafil )   HLD (hyperlipidemia)    Hypertension    Macular degeneration    left    Permanent atrial fibrillation (HCC)    a.) CHA2DS2VASc = 3 (age, HTN, vascular disease history);  b.) rate/rhythm maintained on oral digoxin  + carvedilol ; refuses chronically anticoagulation   RBBB     Past Surgical History:  Procedure Laterality Date   APPENDECTOMY     COLONOSCOPY WITH PROPOFOL  N/A 05/26/2017   Procedure: COLONOSCOPY WITH PROPOFOL ;  Surgeon: Marnee Sink, MD;  Location: [facility] ENDOSCOPY;  Service: Endoscopy;  Laterality: N/A;   COLONOSCOPY WITH PROPOFOL  N/A 06/17/2022   Procedure: COLONOSCOPY WITH PROPOFOL ;  Surgeon: Marnee Sink, MD;  Location: [facility] ENDOSCOPY;  Service: Endoscopy;  Laterality: N/A;   HERNIA REPAIR     1950's x 2   JOINT REPLACEMENT Left 2014   LEFT HEART CATH AND CORONARY ANGIOGRAPHY Left 09/19/2005   Procedure: LEFT HEART CATH AND CORONARY ANGIOGRAPHY; Location: [facility]; Surgeon: Janelle Mediate, MD   PELVIC LYMPH NODE DISSECTION Bilateral 12/22/2022   Procedure: PELVIC LYMPH NODE DISSECTION;  Surgeon: Lawerence Pressman, MD;  Location: [facility] ORS;  Service: Urology;  Laterality: Bilateral;   PROSTATE BIOPSY     ROBOT ASSISTED LAPAROSCOPIC RADICAL PROSTATECTOMY N/A 12/22/2022   Procedure: XI ROBOTIC ASSISTED LAPAROSCOPIC RADICAL PROSTATECTOMY;  Surgeon: Lawerence Pressman, MD;  Location: [facility] ORS;  Service: Urology;  Laterality: N/A;   thumb surgery  1980   had staph infection and had to cut out the area   TOTAL HIP ARTHROPLASTY Left 2014  Dr. Annabell Key   TOTAL HIP ARTHROPLASTY Right 05/04/2019   Procedure: TOTAL HIP ARTHROPLASTY ANTERIOR APPROACH;  Surgeon: Jerlyn Moons, MD;  Location: [facility] ORS;  Service: Orthopedics;  Laterality: Right;     Current Outpatient Medications  Medication Sig Dispense Refill   acetaminophen  (TYLENOL ) 500 MG tablet Take 500 mg by mouth daily as needed.      apixaban  (ELIQUIS ) 5 MG TABS tablet Take 1 tablet (5 mg total) by mouth 2 (two) times daily. 90 tablet 3   carvedilol  (COREG ) 25 MG tablet Take 1 tablet (25 mg total) by  mouth 2 (two) times daily. 180 tablet 4   Cholecalciferol (VITAMIN D -3) 25 MCG (1000 UT) CAPS Take 1 capsule by mouth daily.     digoxin  (LANOXIN ) 0.125 MG tablet TAKE ONE TABLET BY MOUTH DAILY 90 tablet 2   docusate sodium  (COLACE) 100 MG capsule Take 100 mg by mouth daily.     glucosamine-chondroitin 500-400 MG tablet Take 1 tablet by mouth daily.     irbesartan  (AVAPRO ) 300 MG tablet Take 1 tablet (300 mg total) by mouth daily. 90 tablet 3   loratadine  (CLARITIN ) 10 MG tablet Take 10 mg by mouth daily.     lovastatin (MEVACOR) 20 MG tablet TAKE 1 TABLET BY MOUTH ONCE EVERY EVENING 90 tablet 3   Misc Natural Products (LUTEIN 20 PO) Take by mouth.     Multiple Vitamin (MULTIVITAMIN) tablet Take 1 tablet by mouth daily.     sildenafil  (VIAGRA ) 100 MG tablet Take 1 tablet (100 mg total) by mouth daily as needed for erectile dysfunction. 30 tablet 6   spironolactone  (ALDACTONE ) 25 MG tablet Take 1 tablet (25 mg total) by mouth daily. 90 tablet 0   valACYclovir (VALTREX) 1000 MG tablet TAKE 2 TABLETS BY MOUTH EVERY 12 HOURS AS NEEDED AS DIRECTED 20 tablet 2   No current facility-administered medications for this visit.    Allergies:   Patient has no known allergies.    Social History:  The patient  reports that he quit smoking about 14 years ago. His smoking use included cigarettes. He started smoking about 29 years ago. He has a 30 pack-year smoking history. He has never used smokeless tobacco. He reports current alcohol use of about 4.0 - 8.0 standard drinks of alcohol per week. He reports that he does not use drugs.   Family History:  The patient's family history includes Heart attack in his father; Hypertension in his mother; Melanoma in his father.    ROS:  Please see the history of present illness.   Otherwise, review of systems are positive for none.   All other systems are reviewed and negative.    PHYSICAL EXAM: VS:  BP 137/78 (BP Location: Left Arm, Patient Position: Sitting, Cuff  Size: Large)   Pulse (!) 56   Ht 6' (1.829 m)   Wt 271 lb (122.9 kg)   SpO2 98%   BMI 36.75 kg/m  , BMI Body mass index is 36.75 kg/m. GEN: Well nourished, well developed, in no acute distress  HEENT: normal  Neck: no JVD, carotid bruits, or masses Cardiac: Irregularly irregular; no murmurs, rubs, or gallops,no edema  Respiratory:  clear to auscultation bilaterally, normal work of breathing GI: soft, nontender, nondistended, + BS MS: no deformity or atrophy  Skin: warm and dry, no rash Neuro:  Strength and sensation are intact Psych: euthymic mood, full affect   EKG:  EKG is ordered today. The ekg ordered today demonstrates : Atrial fibrillation  with slow ventricular response Right bundle branch block When compared with ECG of 04-Dec-2023 10:14, No significant change was found    Recent Labs: 03/02/2023: ALT 17; TSH 1.460 11/10/2023: BUN 12; Creatinine, Ser 0.90; Hemoglobin 15.4; Platelets 155; Potassium 5.1; Sodium 132    Lipid Panel    Component Value Date/Time   CHOL 131 05/21/2022 1038   TRIG 124 05/21/2022 1038   HDL 44 05/21/2022 1038   CHOLHDL 3.0 05/21/2022 1038   LDLCALC 65 05/21/2022 1038      Wt Readings from Last 3 Encounters:  02/02/24 271 lb (122.9 kg)  12/04/23 267 lb 6 oz (121.3 kg)  10/29/23 267 lb 4 oz (121.2 kg)           No data to display            ASSESSMENT AND PLAN:  1.  Coronary artery disease involving native coronary arteries without angina: Recent PET scan showed normal perfusion but he was noted to have significant coronary calcifications.  Currently with no anginal symptoms.  Continue treatment of risk factors.  2.  Chronic systolic heart failure with mildly reduced LV systolic function: Seems to be due to nonischemic cardiomyopathy with no evidence of volume overload.  Continue carvedilol , irbesartan  and spironolactone .  3.  Permanent atrial fibrillation: Ventricular rate is controlled.  If anything, his heart rate has  been on the low side with complaints of increased fatigue.  I elected to stop digoxin .  Continue carvedilol  25 mg twice daily.  Continue long-term anticoagulation with Eliquis .  4.  Mildly dilated aortic root at 43 mm.  Recommend CTA of the ascending aorta in February 2026.  5.  Hyperlipidemia: He is currently on lovastatin 20 mg once daily.  Most recent lipid profile showed an LDL of 65.  We should consider a more potent statin in the future.  5.  Obesity: I discussed with him the importance of healthy diet and regular exercise.    Disposition:   FU with me in 6 months  Signed,  Antionette Kirks, MD  02/02/2024 3:26 PM    [facility]

## 2024-02-02 NOTE — Patient Instructions (Signed)
 Medication Instructions:  Your physician recommends the following medication changes.  STOP TAKING: Digoxin     *If you need a refill on your cardiac medications before your next appointment, please call your pharmacy*  Lab Work:  No labs ordered today  If you have labs (blood work) drawn today and your tests are completely normal, you will receive your results only by: MyChart Message (if you have MyChart) OR A paper copy in the mail If you have any lab test that is abnormal or we need to change your treatment, we will call you to review the results.  Testing/Procedures:  No test ordered today   Follow-Up: At [facility], you and your health needs are our priority.  As part of our continuing mission to provide you with exceptional heart care, our providers are all part of one team.  This team includes your primary Cardiologist (physician) and Advanced Practice Providers or APPs (Physician Assistants and Nurse Practitioners) who all work together to provide you with the care you need, when you need it.  Your next appointment:   6 month(s)  Provider:   Antionette Kirks, MD   We recommend signing up for the patient portal called "MyChart".  Sign up information is provided on this After Visit Summary.  MyChart is used to connect with patients for Virtual Visits (Telemedicine).  Patients are able to view lab/test results, encounter notes, upcoming appointments, etc.  Non-urgent messages can be sent to your provider as well.   To learn more about what you can do with MyChart, go to [email or web address].

## 2024-02-19 ENCOUNTER — Other Ambulatory Visit: Payer: PPO

## 2024-02-19 DIAGNOSIS — C61 Malignant neoplasm of prostate: Secondary | ICD-10-CM

## 2024-02-20 LAB — PSA: Prostate Specific Ag, Serum: <0.1 ng/mL (ref 0.0–4.0)

## 2024-02-23 ENCOUNTER — Ambulatory Visit: Payer: Self-pay | Admitting: Physician Assistant

## 2024-02-23 DIAGNOSIS — C61 Malignant neoplasm of prostate: Secondary | ICD-10-CM

## 2024-02-24 NOTE — Telephone Encounter (Signed)
 PSA ordered. Appt scheduled.

## 2024-02-29 DIAGNOSIS — H2513 Age-related nuclear cataract, bilateral: Secondary | ICD-10-CM | POA: Diagnosis not present

## 2024-02-29 DIAGNOSIS — H353111 Nonexudative age-related macular degeneration, right eye, early dry stage: Secondary | ICD-10-CM | POA: Diagnosis not present

## 2024-02-29 DIAGNOSIS — H35722 Serous detachment of retinal pigment epithelium, left eye: Secondary | ICD-10-CM | POA: Diagnosis not present

## 2024-02-29 DIAGNOSIS — H353221 Exudative age-related macular degeneration, left eye, with active choroidal neovascularization: Secondary | ICD-10-CM | POA: Diagnosis not present

## 2024-03-03 ENCOUNTER — Ambulatory Visit: Admitting: Cardiovascular Disease

## 2024-04-01 ENCOUNTER — Other Ambulatory Visit: Payer: Self-pay | Admitting: Family Medicine

## 2024-04-01 DIAGNOSIS — I10 Essential (primary) hypertension: Secondary | ICD-10-CM

## 2024-04-01 NOTE — Telephone Encounter (Signed)
 Patient needs an appointment, it has been 9 months since he was seen in office?  LOV-  07/08/2023 NOV-  N/A LRF-  spironolactone  (ALDACTONE ) 25 MG tablet 90 tablets 0 refills

## 2024-04-11 DIAGNOSIS — M1711 Unilateral primary osteoarthritis, right knee: Secondary | ICD-10-CM | POA: Diagnosis not present

## 2024-04-25 DIAGNOSIS — H35722 Serous detachment of retinal pigment epithelium, left eye: Secondary | ICD-10-CM | POA: Diagnosis not present

## 2024-05-31 ENCOUNTER — Ambulatory Visit: Admitting: Cardiology

## 2024-06-02 NOTE — Progress Notes (Deleted)
     Electrophysiology Clinic Note    Date:  06/02/2024  Patient ID:  Steven Saunders., DOB 03-14-1951, MRN [id number] PCP:  Steven Nancyann BRAVO, MD  Cardiologist:  None  Electrophysiologist:  Steven Birmingham, MD    ***refresh  Discussed the use of AI scribe software for clinical note transcription with the patient, who gave verbal consent to proceed.   Patient Profile    Chief Complaint: ***  History of Present Illness: Steven Saunders. is a 73 y.o. male with PMH notable for ***; seen today for Steven Birmingham, MD for {VISITTYPE:28148}     Arrhythmia/Device History No specialty comments available.    ROS:  Please see the history of present illness. All other systems are reviewed and otherwise negative.    Physical Exam    VS:  There were no vitals taken for this visit. BMI: There is no height or weight on file to calculate BMI.           Wt Readings from Last 3 Encounters:  02/02/24 271 lb (122.9 kg)  12/04/23 267 lb 6 oz (121.3 kg)  10/29/23 267 lb 4 oz (121.2 kg)     GEN- The patient is well appearing, alert and oriented x 3 today.   Lungs- Clear to ausculation bilaterally, normal work of breathing.  Heart- {Blank single:19197::Regular,Irregularly irregular} rate and rhythm, no murmurs, rubs or gallops Extremities- {EDEMA LEVEL:28147::No} peripheral edema, warm, dry Skin-  *** device pocket well-healed, no tethering   *** Brief check performed without iterative lead testing/measurements   Device interrogation done today and reviewed by myself:  Battery *** Lead thresholds, impedence, sensing stable *** *** episodes *** changes made today   Studies Reviewed   Previous EP, cardiology notes.    EKG {ACTION; IS/IS WNU:78978602} ordered. Personal review of EKG from {Blank single:19197::today,***} shows:  ***             Assessment and Plan     #) ***   #) ***   {Are you ordering a CV Procedure (e.g. stress test, cath, DCCV,  TEE, etc)?   Press F2        :[id number]}   Current medicines are reviewed at length with the patient today.   The patient {ACTIONS; HAS/DOES NOT HAVE:19233} concerns regarding his medicines.  The following changes were made today:  {NONE DEFAULTED:18576}  Labs/ tests ordered today include: *** No orders of the defined types were placed in this encounter.    Disposition: Follow up with {EPMDS:28135::EP Team} or EP APP {EPFOLLOW UP:28173}   Signed, Chantal Needle, NP  06/02/24  5:18 PM  Electrophysiology [facility]

## 2024-06-03 ENCOUNTER — Ambulatory Visit: Admitting: Cardiology

## 2024-06-20 DIAGNOSIS — H353221 Exudative age-related macular degeneration, left eye, with active choroidal neovascularization: Secondary | ICD-10-CM | POA: Diagnosis not present

## 2024-06-27 DIAGNOSIS — H2513 Age-related nuclear cataract, bilateral: Secondary | ICD-10-CM | POA: Diagnosis not present

## 2024-06-27 DIAGNOSIS — H353111 Nonexudative age-related macular degeneration, right eye, early dry stage: Secondary | ICD-10-CM | POA: Diagnosis not present

## 2024-06-27 DIAGNOSIS — H16229 Keratoconjunctivitis sicca, not specified as Sjogren's, unspecified eye: Secondary | ICD-10-CM | POA: Diagnosis not present

## 2024-06-27 DIAGNOSIS — H538 Other visual disturbances: Secondary | ICD-10-CM | POA: Diagnosis not present

## 2024-06-27 DIAGNOSIS — H353221 Exudative age-related macular degeneration, left eye, with active choroidal neovascularization: Secondary | ICD-10-CM | POA: Diagnosis not present

## 2024-06-30 ENCOUNTER — Other Ambulatory Visit: Payer: Self-pay | Admitting: Family Medicine

## 2024-06-30 DIAGNOSIS — I10 Essential (primary) hypertension: Secondary | ICD-10-CM

## 2024-07-07 ENCOUNTER — Other Ambulatory Visit: Payer: Self-pay | Admitting: Cardiology

## 2024-07-07 NOTE — Telephone Encounter (Signed)
 Prescription refill request for Eliquis  received. Indication: afib  Last office visit: 02/02/2024- Arida Scr: 0.90, 11/10/2023 Age: 74 yo  Weight: 122.9 kg   Refill sent.

## 2024-07-11 DIAGNOSIS — H2512 Age-related nuclear cataract, left eye: Secondary | ICD-10-CM | POA: Diagnosis not present

## 2024-07-11 DIAGNOSIS — H2513 Age-related nuclear cataract, bilateral: Secondary | ICD-10-CM | POA: Diagnosis not present

## 2024-07-15 ENCOUNTER — Encounter: Payer: Self-pay | Admitting: Family Medicine

## 2024-07-15 ENCOUNTER — Ambulatory Visit: Admitting: Family Medicine

## 2024-07-15 VITALS — BP 133/74 | HR 59 | Resp 16 | Ht 72.0 in | Wt 271.6 lb

## 2024-07-15 DIAGNOSIS — M1710 Unilateral primary osteoarthritis, unspecified knee: Secondary | ICD-10-CM

## 2024-07-15 DIAGNOSIS — E785 Hyperlipidemia, unspecified: Secondary | ICD-10-CM | POA: Diagnosis not present

## 2024-07-15 DIAGNOSIS — I10 Essential (primary) hypertension: Secondary | ICD-10-CM | POA: Diagnosis not present

## 2024-07-15 DIAGNOSIS — C61 Malignant neoplasm of prostate: Secondary | ICD-10-CM | POA: Diagnosis not present

## 2024-07-15 DIAGNOSIS — I4821 Permanent atrial fibrillation: Secondary | ICD-10-CM

## 2024-07-15 DIAGNOSIS — R7303 Prediabetes: Secondary | ICD-10-CM

## 2024-07-15 NOTE — Progress Notes (Signed)
 Established patient visit   Patient: Steven Saunders.   DOB: [birth year]   73 y.o. Male  MRN: [id number] Visit Date: 07/15/2024  Today's healthcare provider: Nancyann Perry, MD   Chief Complaint  Patient presents with   Medical Management of Chronic Issues    HTN-needs refills on Spironolactone    Subjective    Discussed the use of AI scribe software for clinical note transcription with the patient, who gave verbal consent to proceed.  History of Present Illness   Steven Saunders. Steven Saunders is a 73 year old male with hypertension and atrial fibrillation who presents for follow-up on his knee pain, blood pressure, and cholesterol.  He experiences significant discomfort in his knees, particularly the right one, due to arthritis. He manages the pain with meloxicam, used sparingly because of its impact on his blood pressure and potential interaction with Eliquis , which he takes for atrial fibrillation. He occasionally uses Tylenol  for additional pain relief.  He monitors his blood pressure at home, with typical readings around 138/78 mmHg. Recently, he noted a reading of 140/85 mmHg in his right arm and 133/74 mmHg in his left arm. He continues to take spironolactone , carvedilol , and irbesartan  for blood pressure management without any issues.  He has gained 20 pounds recently and attributes this to regular consumption of sweets, which he believes has elevated his blood sugar levels. He does not have a family history of diabetes. He acknowledges a lack of regular exercise, citing knee pain as a limiting factor.  He experiences occasional shortness of breath, which he attributes to his weight gain. He quit smoking over ten years ago.     Lab Results  Component Value Date   NA 132 (L) 11/10/2023   K 5.1 11/10/2023   CREATININE 0.90 11/10/2023   EGFR 90 11/10/2023   GLUCOSE 165 (H) 11/10/2023   Lab Results  Component Value Date   TSH 1.460 03/02/2023      Medications: Outpatient Medications Prior to Visit  Medication Sig   acetaminophen  (TYLENOL ) 500 MG tablet Take 500 mg by mouth daily as needed.    apixaban  (ELIQUIS ) 5 MG TABS tablet TAKE 1 TABLET BY MOUTH TWICE DAILY   carvedilol  (COREG ) 25 MG tablet Take 1 tablet (25 mg total) by mouth 2 (two) times daily.   Cholecalciferol (VITAMIN D -3) 25 MCG (1000 UT) CAPS Take 1 capsule by mouth daily.   docusate sodium  (COLACE) 100 MG capsule Take 100 mg by mouth daily.   glucosamine-chondroitin 500-400 MG tablet Take 1 tablet by mouth daily.   irbesartan  (AVAPRO ) 300 MG tablet Take 1 tablet (300 mg total) by mouth daily.   loratadine  (CLARITIN ) 10 MG tablet Take 10 mg by mouth daily.   lovastatin (MEVACOR) 20 MG tablet TAKE 1 TABLET BY MOUTH ONCE EVERY EVENING   meloxicam (MOBIC) 15 MG tablet Take 15 mg by mouth daily.   Misc Natural Products (LUTEIN 20 PO) Take by mouth.   Multiple Vitamin (MULTIVITAMIN) tablet Take 1 tablet by mouth daily.   sildenafil  (VIAGRA ) 100 MG tablet Take 1 tablet (100 mg total) by mouth daily as needed for erectile dysfunction.   spironolactone  (ALDACTONE ) 25 MG tablet TAKE 1 TABLET BY MOUTH ONCE DAILY *NEED APPOINTMENT FOR FURTHER REFILLS   valACYclovir (VALTREX) 1000 MG tablet TAKE 2 TABLETS BY MOUTH EVERY 12 HOURS AS NEEDED AS DIRECTED   No facility-administered medications prior to visit.   Review of Systems  Constitutional:  Negative for appetite change,  chills and fever.  Respiratory:  Negative for chest tightness, shortness of breath and wheezing.   Cardiovascular:  Negative for chest pain and palpitations.  Gastrointestinal:  Negative for abdominal pain, nausea and vomiting.       Objective    BP 133/74 (BP Location: Left Arm, Patient Position: Sitting, Cuff Size: Large)   Pulse (!) 59   Resp 16   Ht 6' (1.829 m)   Wt 271 lb 9.6 oz (123.2 kg)   SpO2 100%   BMI 36.84 kg/m   Physical Exam   General: Appearance:    Obese male in no acute  distress  Eyes:    PERRL, conjunctiva/corneas clear, EOM's intact       Lungs:     Clear to auscultation bilaterally, respirations unlabored  Heart:    Bradycardic. Irregularly irregular rhythm. No murmurs, rubs, or gallops.    MS:   All extremities are intact.    Neurologic:   Awake, alert, oriented x 3. No apparent focal neurological defect.          Assessment & Plan    1. Prediabetes (Primary)  - Hemoglobin A1c  2. Permanent atrial fibrillation (HCC) On DOAC, rate well controlled. Continue routine follow up with his cardiologist Dr. Darron  3. Primary hypertension Well controlled.  Continue current medications.    4. Hyperlipidemia, unspecified hyperlipidemia type He is tolerating lovastatin well with no adverse effects.   - Comprehensive metabolic panel with GFR - Lipid panel  5. Primary osteoarthritis of knee, unspecified laterality Continue routine follow up orthopedics.   Counseled on potential complications of taking oral NSAID such as meloxicam while on DOAC. Recommend he try OTC topical NSAID such as diclofenac gel and  6. Morbid obesity (HCC) Activity limited by OA above, associated with hypertension and hyperlipidemia. Work on reducing calorie intake and finding low impact exercises.   7. Prostate cancer Continue follow up Dr. Lorence for surveillance.        Nancyann Perry, MD  [facility] [phone] (phone) [phone] (fax)  [facility]

## 2024-07-15 NOTE — Patient Instructions (Addendum)
 Please review the attached list of medications and notify my office if there are any errors.   Try  over the counter Voltaren gel (a.k.a. diclofenac gel) for the arthritis in your knee

## 2024-07-16 LAB — COMPREHENSIVE METABOLIC PANEL WITH GFR
ALT: 17 IU/L (ref 0–44)
AST: 17 IU/L (ref 0–40)
Albumin: 4.5 g/dL (ref 3.8–4.8)
Alkaline Phosphatase: 71 IU/L (ref 47–123)
BUN/Creatinine Ratio: 17 (ref 10–24)
BUN: 14 mg/dL (ref 8–27)
Bilirubin Total: 1 mg/dL (ref 0.0–1.2)
CO2: 25 mmol/L (ref 20–29)
Calcium: 9.4 mg/dL (ref 8.6–10.2)
Chloride: 93 mmol/L — ABNORMAL LOW (ref 96–106)
Creatinine, Ser: 0.84 mg/dL (ref 0.76–1.27)
Globulin, Total: 2.3 g/dL (ref 1.5–4.5)
Glucose: 134 mg/dL — ABNORMAL HIGH (ref 70–99)
Potassium: 5.5 mmol/L — ABNORMAL HIGH (ref 3.5–5.2)
Sodium: 129 mmol/L — ABNORMAL LOW (ref 134–144)
Total Protein: 6.8 g/dL (ref 6.0–8.5)
eGFR: 91 mL/min/1.73 (ref >59)

## 2024-07-16 LAB — HEMOGLOBIN A1C
Est. average glucose Bld gHb Est-mCnc: 134 mg/dL
Hgb A1c MFr Bld: 6.3 % — ABNORMAL HIGH (ref 4.8–5.6)

## 2024-07-16 LAB — LIPID PANEL
Chol/HDL Ratio: 2.3 ratio (ref 0.0–5.0)
Cholesterol, Total: 131 mg/dL (ref 100–199)
HDL: 57 mg/dL (ref >39)
LDL Chol Calc (NIH): 55 mg/dL (ref 0–99)
Triglycerides: 107 mg/dL (ref 0–149)
VLDL Cholesterol Cal: 19 mg/dL (ref 5–40)

## 2024-07-17 ENCOUNTER — Ambulatory Visit: Payer: Self-pay | Admitting: Family Medicine

## 2024-08-01 ENCOUNTER — Other Ambulatory Visit: Payer: Self-pay | Admitting: Family Medicine

## 2024-08-01 DIAGNOSIS — I10 Essential (primary) hypertension: Secondary | ICD-10-CM

## 2024-08-18 ENCOUNTER — Other Ambulatory Visit

## 2024-08-18 DIAGNOSIS — C61 Malignant neoplasm of prostate: Secondary | ICD-10-CM

## 2024-08-19 LAB — PSA: Prostate Specific Ag, Serum: <0.1 ng/mL (ref 0.0–4.0)

## 2024-08-22 ENCOUNTER — Ambulatory Visit: Payer: Self-pay | Admitting: Urology

## 2024-08-31 ENCOUNTER — Ambulatory Visit: Admitting: Urology

## 2024-09-07 ENCOUNTER — Ambulatory Visit: Admitting: Urology

## 2024-09-07 VITALS — BP 146/90 | HR 81 | Ht 72.0 in | Wt 268.0 lb

## 2024-09-07 DIAGNOSIS — N529 Male erectile dysfunction, unspecified: Secondary | ICD-10-CM | POA: Diagnosis not present

## 2024-09-07 DIAGNOSIS — C61 Malignant neoplasm of prostate: Secondary | ICD-10-CM | POA: Diagnosis not present

## 2024-09-07 DIAGNOSIS — R399 Unspecified symptoms and signs involving the genitourinary system: Secondary | ICD-10-CM | POA: Diagnosis not present

## 2024-09-07 NOTE — Progress Notes (Signed)
" ° °  09/07/2024 8:51 AM   Steven Saunders. [birth year] [id number]  Reason for visit: Follow up prostate cancer, ED  History: Long history of favorable intermediate risk prostate cancer, previously followed by a different urologist and on active surveillance Persistent rising PSA up to 11, prostate MRI January 2024 showed PI-RADS 5 lesion alongside the capsule concerning for extracapsular extension and SV involvement and opted for radical prostatectomy Robotic radical prostatectomy and bilateral pelvic lymph node dissection on 12/22/2022. Pathology results showed negative lymph nodes, 80% of prostate involved with grade group 2 Gleason score 3+4=7 disease including a positive margin at the bladder neck.  PSA has been undetectable postop Was on max dose sildenafil  preop, persistent ED postop despite PDE 5 inhibitors, not interested in other treatments at this time Minimal incontinence  Physical Exam: BP (!) 146/90 (BP Location: Left Arm, Patient Position: Sitting, Cuff Size: Large)   Pulse 81   Ht 6' (1.829 m)   Wt 268 lb (121.6 kg)   SpO2 98%   BMI 36.35 kg/m   Imaging/labs: PSA 08/18/2024 undetectable  Today: Overall doing well, no major changes Voiding every 2-3 hours during the day, nocturia 2-3 times at night, no significant incontinence Persistent ED, not interested in other treatments  Plan:   Prostate cancer: PSA remains undetectable, continue PSA every 6 months for the first 4 years, and if remains undetectable spaced to yearly.  We discussed risk of biochemical recurrence that could warrant radiation in the future ED: Multifactorial, related to radical prostatectomy and  HTN medications, other comorbidities failed PDE 5 inhibitors, was on high-dose PDE 5 inhibitors preop, not interested in other treatment options at this time RTC lab visit PSA 6 months and call with results, will schedule 1 year follow-up with PSA   Steven JAYSON Burnet, MD  [facility] [address] [place], [place] [address] [phone]  "

## 2024-09-07 NOTE — Patient Instructions (Signed)
 Understanding Erectile Dysfunction (ED)  What is ED? Erectile Dysfunction, or ED, is when a man has trouble getting or keeping an erection enough for sex. It is common and can happen at any age but is more common as men get older.  What Causes ED? ED can happen for many reasons, including:    Stress or feeling anxious Health problems like diabetes, heart disease, or high blood pressure Lifestyle habits like smoking, drinking too much alcohol, drug use, or not enough exercise Certain medicines(some blood pressure medications, antidepressants, sedatives)  Symptoms of ED:   Difficulty getting an erection Trouble keeping an erection during sex Reduced interest in sex  Treatment Options There are different ways to treat ED. Talk to your doctor to find what's best for you.  Lifestyle Changes   Exercise regularly Eat healthy foods Quit smoking and limit alcohol Weight loss Reduce stress  Medications   Oral medicines like Viagra(sildenafil) or Cialis (tadalafil ).  These are generic and affordable, the lowest price is at costplusdrugs.com.  These must be taken about an hour before sexual activity, and still require sexual stimulation to get an erection Side effects can include stuffy nose, headache, muscle pain, or changes in vision There is no risk of heart attack or stroke when medications are taken as directed These medications cannot be taken with nitrates for chest pain  Other Treatments    Penile injections-a medicine is injected directly into the penis to give you an immediate erection Devices like pump vacuum systems that help create an erection Surgery: Penile prosthesis for patients with no improvement with medicines or injections who are still interested in sexual activity.  Visit [email or web address] for more details.  Dr. Lovie is a urologist in [facility] with [facility] who performs penile prosthesis surgeries Counseling or Therapy    If ED is caused by  stress, anxiety, or relationship issues, talking to a counselor or therapist can help.

## 2024-09-14 NOTE — Discharge Instructions (Signed)

## 2024-09-15 ENCOUNTER — Ambulatory Visit: Admission: RE | Admit: 2024-09-15 | Discharge: 2024-09-15 | Disposition: A | Discharge status: Home / Self Care

## 2024-09-15 ENCOUNTER — Ambulatory Visit: Payer: Self-pay | Admitting: Anesthesiology

## 2024-09-15 ENCOUNTER — Other Ambulatory Visit: Payer: Self-pay

## 2024-09-15 ENCOUNTER — Encounter: Admission: RE | Disposition: A | Payer: Self-pay | Source: Home / Self Care

## 2024-09-15 ENCOUNTER — Encounter: Payer: Self-pay | Admitting: Anesthesiology

## 2024-09-15 DIAGNOSIS — Z87891 Personal history of nicotine dependence: Secondary | ICD-10-CM | POA: Insufficient documentation

## 2024-09-15 DIAGNOSIS — E669 Obesity, unspecified: Secondary | ICD-10-CM | POA: Diagnosis not present

## 2024-09-15 DIAGNOSIS — I10 Essential (primary) hypertension: Secondary | ICD-10-CM | POA: Insufficient documentation

## 2024-09-15 DIAGNOSIS — I4821 Permanent atrial fibrillation: Secondary | ICD-10-CM | POA: Diagnosis not present

## 2024-09-15 DIAGNOSIS — Z6836 Body mass index (BMI) 36.0-36.9, adult: Secondary | ICD-10-CM | POA: Diagnosis not present

## 2024-09-15 DIAGNOSIS — Z8546 Personal history of malignant neoplasm of prostate: Secondary | ICD-10-CM | POA: Diagnosis not present

## 2024-09-15 DIAGNOSIS — Z86711 Personal history of pulmonary embolism: Secondary | ICD-10-CM | POA: Diagnosis not present

## 2024-09-15 DIAGNOSIS — I2510 Atherosclerotic heart disease of native coronary artery without angina pectoris: Secondary | ICD-10-CM | POA: Insufficient documentation

## 2024-09-15 DIAGNOSIS — H2512 Age-related nuclear cataract, left eye: Secondary | ICD-10-CM | POA: Diagnosis present

## 2024-09-15 DIAGNOSIS — Z7901 Long term (current) use of anticoagulants: Secondary | ICD-10-CM | POA: Diagnosis not present

## 2024-09-15 DIAGNOSIS — I4510 Unspecified right bundle-branch block: Secondary | ICD-10-CM | POA: Diagnosis not present

## 2024-09-15 HISTORY — PX: CATARACT EXTRACTION W/PHACO: SHX586

## 2024-09-15 HISTORY — DX: Unspecified osteoarthritis, unspecified site: M19.90

## 2024-09-15 MED ORDER — MIDAZOLAM HCL 2 MG/2ML IJ SOLN
INTRAMUSCULAR | Status: AC
  Filled 2024-09-15: qty 2

## 2024-09-15 MED ORDER — FENTANYL CITRATE (PF) 100 MCG/2ML IJ SOLN
INTRAMUSCULAR | Status: DC | PRN
  Administered 2024-09-15 (×2): 25 ug via INTRAVENOUS

## 2024-09-15 MED ORDER — SIGHTPATH DOSE#1 BSS IO SOLN
INTRAOCULAR | Status: DC | PRN
  Administered 2024-09-15: 15 mL via INTRAOCULAR

## 2024-09-15 MED ORDER — CYCLOPENTOLATE HCL 2 % OP SOLN
OPHTHALMIC | Status: AC
  Filled 2024-09-15: qty 2

## 2024-09-15 MED ORDER — MOXIFLOXACIN HCL 0.5 % OP SOLN
OPHTHALMIC | Status: DC | PRN
  Administered 2024-09-15: .2 mL via OPHTHALMIC

## 2024-09-15 MED ORDER — CYCLOPENTOLATE HCL 2 % OP SOLN
1.0000 [drp] | OPHTHALMIC | Status: AC | PRN
  Administered 2024-09-15 (×3): 1 [drp] via OPHTHALMIC

## 2024-09-15 MED ORDER — BRIMONIDINE TARTRATE-TIMOLOL 0.2-0.5 % OP SOLN
OPHTHALMIC | Status: DC | PRN
  Administered 2024-09-15: 1 [drp] via OPHTHALMIC

## 2024-09-15 MED ORDER — PHENYLEPHRINE HCL 10 % OP SOLN
1.0000 [drp] | OPHTHALMIC | Status: AC | PRN
  Administered 2024-09-15 (×3): 1 [drp] via OPHTHALMIC

## 2024-09-15 MED ORDER — SIGHTPATH DOSE#1 BSS IO SOLN
INTRAOCULAR | Status: DC | PRN
  Administered 2024-09-15: 145 mL via OPHTHALMIC

## 2024-09-15 MED ORDER — LIDOCAINE HCL (PF) 2 % IJ SOLN
INTRAOCULAR | Status: DC | PRN
  Administered 2024-09-15: 1 mL via INTRAOCULAR

## 2024-09-15 MED ORDER — PHENYLEPHRINE HCL 10 % OP SOLN
OPHTHALMIC | Status: AC
  Filled 2024-09-15: qty 5

## 2024-09-15 MED ORDER — TETRACAINE HCL 0.5 % OP SOLN
OPHTHALMIC | Status: AC
  Filled 2024-09-15: qty 4

## 2024-09-15 MED ORDER — SIGHTPATH DOSE#1 NA HYALUR & NA CHOND-NA HYALUR IO KIT
PACK | INTRAOCULAR | Status: DC | PRN
  Administered 2024-09-15: 1 via OPHTHALMIC

## 2024-09-15 MED ORDER — TETRACAINE HCL 0.5 % OP SOLN
1.0000 [drp] | OPHTHALMIC | Status: DC | PRN
  Administered 2024-09-15 (×3): 1 [drp] via OPHTHALMIC

## 2024-09-15 MED ORDER — MIDAZOLAM HCL (PF) 2 MG/2ML IJ SOLN
INTRAMUSCULAR | Status: DC | PRN
  Administered 2024-09-15: 2 mg via INTRAVENOUS

## 2024-09-15 MED ORDER — FENTANYL CITRATE (PF) 100 MCG/2ML IJ SOLN
INTRAMUSCULAR | Status: AC
  Filled 2024-09-15: qty 2

## 2024-09-15 NOTE — Transfer of Care (Signed)
 Immediate Anesthesia Transfer of Care Note  Patient: Steven Saunders.  Procedure(s) Performed: PHACOEMULSIFICATION, CATARACT, WITH IOL INSERTION 16.19, 02:04.3 (Left)  Patient Location: PACU  Anesthesia Type: MAC  Level of Consciousness: awake, alert  and patient cooperative  Airway and Oxygen Therapy: Patient Spontanous Breathing and Patient connected to supplemental oxygen  Post-op Assessment: Post-op Vital signs reviewed, Patient's Cardiovascular Status Stable, Respiratory Function Stable, Patent Airway and No signs of Nausea or vomiting  Post-op Vital Signs: Reviewed and stable  Complications: No notable events documented.

## 2024-09-15 NOTE — Anesthesia Preprocedure Evaluation (Signed)
"                                    Anesthesia Evaluation  Patient identified by MRN, date of birth, ID band Patient awake    Reviewed: Allergy & Precautions, NPO status , Patient's Chart, lab work & pertinent test results  History of Anesthesia Complications Negative for: history of anesthetic complications  Airway Mallampati: III   Neck ROM: Full    Dental  (+) Missing   Pulmonary former smoker, PE   Pulmonary exam normal breath sounds clear to auscultation       Cardiovascular Exercise Tolerance: Good hypertension, Pt. on home beta blockers and Pt. on medications (-) angina + CAD  + dysrhythmias (a fib) Atrial Fibrillation  Rhythm:Irregular Rate:Normal - Systolic murmurs ECG 10/03/21: A fib, RBBB LHC 09/19/2005: 30 mLAD, 30% dLAD - med mgmt.   Neuro/Psych neuropathy  Neuromuscular disease    GI/Hepatic negative GI ROS,,,  Endo/Other  Obesity   Renal/GU negative Renal ROS   Prostate CA    Musculoskeletal   Abdominal  (+) + obese  Peds  Hematology thrombocytopenia   Anesthesia Other Findings Pertinent PMH includes: CAD, atrial fibrillation, RBBB, aortic atherosclerosis, HTN, HLD, ED (on PDE5i).  Reproductive/Obstetrics                              Anesthesia Physical Anesthesia Plan  ASA: 3  Anesthesia Plan: MAC   Post-op Pain Management:    Induction:   PONV Risk Score and Plan:   Airway Management Planned: Natural Airway  Additional Equipment:   Intra-op Plan:   Post-operative Plan:   Informed Consent: I have reviewed the patients History and Physical, chart, labs and discussed the procedure including the risks, benefits and alternatives for the proposed anesthesia with the patient or authorized representative who has indicated his/her understanding and acceptance.       Plan Discussed with: CRNA and Anesthesiologist  Anesthesia Plan Comments:          Anesthesia Quick Evaluation  "

## 2024-09-15 NOTE — H&P (Signed)
 [facility]   Primary Care Physician:  Gasper Nancyann BRAVO, MD Ophthalmologist: Dr. Curtistine Fava  Pre-Procedure History & Physical: HPI:  Steven Saunders. is a 74 y.o. male here for cataract surgery.   Past Medical History:  Diagnosis Date   Adenocarcinoma of prostate (HCC)    a.) Bx (+) Gleason 6 (8/12 cores) with PSA 5.3 in 2018; b.) Bx (+) 2019 Gleason 6 (9/12 cores) with PSA 6.3; c.) 09/2022 --> stage IIIb (cT3b N0 M0) Gleason 7 (3+4) with  PSA of 11 --> prostate MRI with signs of extracapsular extension and suspicion for seminal vesicle involvement of the right base  --> PSMA PET 11/03/2022 (+) FDG uptake right lateral gland (SUV max 13.1); no metastatic disease.   Aortic atherosclerosis    Arthritis    CAD (coronary artery disease) 09/19/2005   a.) LHC 09/19/2005: 30 mLAD, 30% dLAD - med mgmt.   ED (erectile dysfunction)    a.) on PDE5i (sildenafil )   HLD (hyperlipidemia)    Hypertension    Macular degeneration    left   Permanent atrial fibrillation (HCC)    a.) CHA2DS2VASc = 3 (age, HTN, vascular disease history);  b.) rate/rhythm maintained on oral digoxin  + carvedilol ; refuses chronically anticoagulation   RBBB     Past Surgical History:  Procedure Laterality Date   APPENDECTOMY     COLONOSCOPY WITH PROPOFOL  N/A 05/26/2017   Procedure: COLONOSCOPY WITH PROPOFOL ;  Surgeon: Jinny Carmine, MD;  Location: [facility];  Service: Endoscopy;  Laterality: N/A;   COLONOSCOPY WITH PROPOFOL  N/A 06/17/2022   Procedure: COLONOSCOPY WITH PROPOFOL ;  Surgeon: Jinny Carmine, MD;  Location: [facility];  Service: Endoscopy;  Laterality: N/A;   HERNIA REPAIR     1950's x 2   JOINT REPLACEMENT Left 2014   LEFT HEART CATH AND CORONARY ANGIOGRAPHY Left 09/19/2005   Procedure: LEFT HEART CATH AND CORONARY ANGIOGRAPHY; Location: [facility]; Surgeon: Maude Emmer, MD   PELVIC LYMPH NODE DISSECTION Bilateral 12/22/2022   Procedure: PELVIC LYMPH NODE DISSECTION;  Surgeon:  Francisca Redell BROCKS, MD;  Location: [facility];  Service: Urology;  Laterality: Bilateral;   PROSTATE BIOPSY     ROBOT ASSISTED LAPAROSCOPIC RADICAL PROSTATECTOMY N/A 12/22/2022   Procedure: XI ROBOTIC ASSISTED LAPAROSCOPIC RADICAL PROSTATECTOMY;  Surgeon: Francisca Redell BROCKS, MD;  Location: [facility];  Service: Urology;  Laterality: N/A;   SPINE SURGERY  08/08/2019   thumb surgery  1980   had staph infection and had to cut out the area   TOTAL HIP ARTHROPLASTY Left 2014   Dr. Cleotilde   TOTAL HIP ARTHROPLASTY Right 05/04/2019   Procedure: TOTAL HIP ARTHROPLASTY ANTERIOR APPROACH;  Surgeon: Leora Lynwood SAUNDERS, MD;  Location: [facility];  Service: Orthopedics;  Laterality: Right;    Prior to Admission medications  Medication Sig Start Date End Date Taking? Authorizing Provider  acetaminophen  (TYLENOL ) 500 MG tablet Take 500 mg by mouth daily as needed.    Yes [provider]  apixaban  (ELIQUIS ) 5 MG TABS tablet TAKE 1 TABLET BY MOUTH TWICE DAILY 07/07/24  Yes Riddle, Suzann, NP  Ascorbic Acid (VITAMIN C) 500 MG CAPS Take 1 capsule by mouth daily.   Yes [provider]  carvedilol  (COREG ) 25 MG tablet Take 1 tablet (25 mg total) by mouth 2 (two) times daily. 11/26/23  Yes Gasper Nancyann BRAVO, MD  Cholecalciferol (VITAMIN D -3) 25 MCG (1000 UT) CAPS Take 1 capsule by mouth daily.   Yes [provider]  docusate sodium  (COLACE) 100  MG capsule Take 100 mg by mouth daily.   Yes [provider]  folic acid (FOLVITE) 800 MCG tablet Take 1,000 mcg by mouth daily.   Yes [provider]  glucosamine-chondroitin 500-400 MG tablet Take 1 tablet by mouth daily.   Yes [provider]  irbesartan  (AVAPRO ) 300 MG tablet Take 1 tablet (300 mg total) by mouth daily. 10/29/23  Yes Riddle, Suzann, NP  loratadine  (CLARITIN ) 10 MG tablet Take 10 mg by mouth daily.   Yes [provider]  lovastatin (MEVACOR) 20 MG tablet TAKE 1 TABLET BY MOUTH ONCE EVERY EVENING 06/30/24  Yes  Gasper Nancyann BRAVO, MD  meloxicam (MOBIC) 15 MG tablet Take 15 mg by mouth daily. 04/11/24  Yes [provider]  Misc Natural Products (LUTEIN 20 PO) Take by mouth.   Yes [provider]  Multiple Vitamin (MULTIVITAMIN) tablet Take 1 tablet by mouth daily.   Yes [provider]  sildenafil  (VIAGRA ) 100 MG tablet Take 1 tablet (100 mg total) by mouth daily as needed for erectile dysfunction. 10/22/23  Yes Francisca Redell BROCKS, MD  spironolactone  (ALDACTONE ) 25 MG tablet Take 1 tablet (25 mg total) by mouth daily. 08/01/24  Yes Gasper Nancyann BRAVO, MD  valACYclovir (VALTREX) 1000 MG tablet TAKE 2 TABLETS BY MOUTH EVERY 12 HOURS AS NEEDED AS DIRECTED 08/28/22  Yes Gasper Nancyann BRAVO, MD    Allergies as of 06/29/2024   (No Known Allergies)    Family History  Problem Relation Age of Onset   Hypertension Mother    Heart attack Father    Melanoma Father    Prostate cancer Neg Hx    Bladder Cancer Neg Hx    Kidney cancer Neg Hx     Social History   Socioeconomic History   Marital status: Married    Spouse name: Rollene   Number of children: 2   Years of education: Not on file   Highest education level: Some college, no degree  Occupational History   Occupation: Retired    Comment: Still drives school bus  Tobacco Use   Smoking status: Former    Current packs/day: 0.00    Average packs/day: 2.0 packs/day for 4.0 years (8.0 ttl pk-yrs)    Types: Cigarettes    Start date: 11/13/1994    Quit date: 11/13/1998    Years since quitting: 25.8   Smokeless tobacco: Never   Tobacco comments:    Pt states he quit around 2000?  Vaping Use   Vaping status: Never Used  Substance and Sexual Activity   Alcohol use: Yes    Alcohol/week: 4.0 - 8.0 standard drinks of alcohol    Types: 4 - 8 Cans of beer per week    Comment: 1-2 beers 4xs a week   Drug use: Yes    Types: Marijuana    Comment: as teenager   Sexual activity: Not Currently    Birth control/protection: None  Other  Topics Concern   Not on file  Social History Narrative   2 biological children and 2 step-children   Social Drivers of Health   Tobacco Use: Medium Risk (09/15/2024)   Patient History    Smoking Tobacco Use: Former    Smokeless Tobacco Use: Never    Passive Exposure: Not on file  Financial Resource Strain: Low Risk (07/08/2024)   Overall Financial Resource Strain (CARDIA)    Difficulty of Paying Living Expenses: Not very hard  Food Insecurity: No Food Insecurity (07/08/2024)   Epic  Worried About Programme Researcher, Broadcasting/film/video in the Last Year: Never true    Ran Out of Food in the Last Year: Never true  Transportation Needs: No Transportation Needs (07/08/2024)   Epic    Lack of Transportation (Medical): No    Lack of Transportation (Non-Medical): No  Physical Activity: Insufficiently Active (07/08/2024)   Exercise Vital Sign    Days of Exercise per Week: 1 day    Minutes of Exercise per Session: 10 min  Stress: No Stress Concern Present (07/08/2024)   [employer] of Occupational Health - Occupational Stress Questionnaire    Feeling of Stress: Not at all  Social Connections: Moderately Integrated (07/08/2024)   Social Connection and Isolation Panel    Frequency of Communication with Friends and Family: More than three times a week    Frequency of Social Gatherings with Friends and Family: Twice a week    Attends Religious Services: 1 to 4 times per year    Active Member of [employer] or Organizations: No    Attends Banker Meetings: Not on file    Marital Status: Married  Catering Manager Violence: Not At Risk (07/08/2023)   Humiliation, Afraid, Rape, and Kick questionnaire    Fear of Current or Ex-Partner: No    Emotionally Abused: No    Physically Abused: No    Sexually Abused: No  Depression (PHQ2-9): Low Risk (07/15/2024)   Depression (PHQ2-9)    PHQ-2 Score: 0  Alcohol Screen: Low Risk (07/08/2024)   Alcohol Screen    Last Alcohol Screening Score (AUDIT): 3   Housing: Low Risk (07/08/2024)   Epic    Unable to Pay for Housing in the Last Year: No    Number of Times Moved in the Last Year: 0    Homeless in the Last Year: No  Utilities: Not At Risk (07/04/2023)   [facility] Utilities    Threatened with loss of utilities: No  Health Literacy: Adequate Health Literacy (07/08/2023)   B1300 Health Literacy    Frequency of need for help with medical instructions: Never    Review of Systems: See HPI, otherwise negative ROS  Physical Exam: BP (!) 140/81   Pulse 70   Temp (!) 97.3 F (36.3 C) (Temporal)   Resp 17   Ht 6' (1.829 m)   Wt 122 kg   SpO2 99%   BMI 36.48 kg/m  General:   Alert, cooperative in NAD Head:  Normocephalic and atraumatic. Respiratory:  Normal work of breathing. Cardiovascular:  RRR  Impression/Plan: Steven ONEIDA Lurena Mickey. is here for cataract surgery LEFT EYE.  Risks, benefits, limitations, and alternatives regarding cataract surgery have been reviewed with the patient.  Questions have been answered.  All parties agreeable.   Curtistine JINNY Fava, MD  09/15/2024, 7:46 AM

## 2024-09-15 NOTE — Anesthesia Postprocedure Evaluation (Signed)
"   Anesthesia Post Note  Patient: Naheem Mosco.  Procedure(s) Performed: PHACOEMULSIFICATION, CATARACT, WITH IOL INSERTION 16.19, 02:04.3 (Left)  Patient location during evaluation: PACU Anesthesia Type: MAC Level of consciousness: awake and alert Pain management: pain level controlled Vital Signs Assessment: post-procedure vital signs reviewed and stable Respiratory status: spontaneous breathing, nonlabored ventilation and respiratory function stable Cardiovascular status: blood pressure returned to baseline and stable Postop Assessment: no apparent nausea or vomiting Anesthetic complications: no   No notable events documented.   Last Vitals:  Vitals:   09/15/24 0842 09/15/24 0843  BP: 106/73   Pulse: 61 (!) 30  Resp: (!) 23 13  Temp:    SpO2: 94% 95%    Last Pain:  Vitals:   09/15/24 0842  TempSrc:   PainSc: 0-No pain                 Camellia Merilee Louder      "

## 2024-09-15 NOTE — Op Note (Signed)
 PREOPERATIVE DIAGNOSIS:  Nuclear sclerotic cataract of the left eye.   POSTOPERATIVE DIAGNOSIS:  Nuclear sclerotic cataract of the left eye.   OPERATIVE PROCEDURE: Phacoemulsification with IOL Implant Left Eye   SURGEON:  Curtistine Fava, MD   ANESTHESIA:  Anesthesiologist: Vicci Camellia Glatter, MD CRNA: [place], [place], CRNA  1.      Managed anesthesia care. 2.     0.45ml of epi-Shugarcaine was instilled following the paracentesis   COMPLICATIONS:  None.   TECHNIQUE:   Phacoemulsification divide and conquer   DESCRIPTION OF PROCEDURE:  The patient was examined and consented in the preoperative holding area where the aforementioned topical anesthesia was applied to the left eye and then brought back to the Operating Room where the left eye was prepped and draped in the usual sterile ophthalmic fashion and a lid speculum was placed. A paracentesis was created with the side port blade and the anterior chamber was filled with epi-Shugarcaine follower by viscoelastic. A clear corneal incision was performed with the steel keratome. A continuous curvilinear capsulorrhexis was performed with a cystotome followed by the capsulorrhexis forceps. Hydrodissection and hydrodelineation were carried out with BSS on a blunt cannula. The lens was removed in a divide and conquer technique and the remaining cortical material was removed with the irrigation-aspiration handpiece. The capsular bag was inflated with viscoelastic and the +15.5 CC60WF lens was placed in the capsular bag without complication. The remaining viscoelastic was removed from the eye with the irrigation-aspiration handpiece. The wounds were hydrated. The anterior chamber was flushed with BSS and the eye was inflated to physiologic pressure. 0.16ml of Vigamox  was placed in the anterior chamber. The wounds were found to be water  tight. The eye was dressed with Combigan  and covered with a clear shield to be worn until the first postoperative day  appointment. The patient was given protective glasses to wear throughout the day. The patient was also given drops with which to begin a drop regimen today and will follow-up with me in one day. Implant Name Type Inv. Item Serial No. Manufacturer Lot No. LRB No. Used Action  [id number]   [id number]   Left 1 Implanted    Procedures: PHACOEMULSIFICATION, CATARACT, WITH IOL INSERTION 16.19, 02:04.3 (Left)  Electronically signed: Curtistine PARAS [facility] 09/15/2024 8:33 AM

## 2024-09-27 NOTE — Discharge Instructions (Signed)

## 2024-09-29 ENCOUNTER — Other Ambulatory Visit: Payer: Self-pay

## 2024-09-29 ENCOUNTER — Encounter: Admission: RE | Disposition: A | Discharge status: Home / Self Care | Payer: Self-pay | Source: Home / Self Care

## 2024-09-29 ENCOUNTER — Ambulatory Visit: Admission: RE | Admit: 2024-09-29 | Discharge: 2024-09-29 | Disposition: A | Discharge status: Home / Self Care

## 2024-09-29 ENCOUNTER — Encounter: Payer: Self-pay | Admitting: Anesthesiology

## 2024-09-29 DIAGNOSIS — E785 Hyperlipidemia, unspecified: Secondary | ICD-10-CM | POA: Diagnosis not present

## 2024-09-29 DIAGNOSIS — Z79899 Other long term (current) drug therapy: Secondary | ICD-10-CM | POA: Insufficient documentation

## 2024-09-29 DIAGNOSIS — Z87891 Personal history of nicotine dependence: Secondary | ICD-10-CM | POA: Diagnosis not present

## 2024-09-29 DIAGNOSIS — I10 Essential (primary) hypertension: Secondary | ICD-10-CM | POA: Insufficient documentation

## 2024-09-29 DIAGNOSIS — I4821 Permanent atrial fibrillation: Secondary | ICD-10-CM | POA: Diagnosis not present

## 2024-09-29 DIAGNOSIS — H2511 Age-related nuclear cataract, right eye: Secondary | ICD-10-CM | POA: Insufficient documentation

## 2024-09-29 DIAGNOSIS — I2510 Atherosclerotic heart disease of native coronary artery without angina pectoris: Secondary | ICD-10-CM | POA: Diagnosis not present

## 2024-09-29 MED ORDER — TETRACAINE HCL 0.5 % OP SOLN
1.0000 [drp] | OPHTHALMIC | Status: DC | PRN
  Administered 2024-09-29 (×3): 1 [drp] via OPHTHALMIC

## 2024-09-29 MED ORDER — MIDAZOLAM HCL 2 MG/2ML IJ SOLN
INTRAMUSCULAR | Status: AC
  Filled 2024-09-29: qty 2

## 2024-09-29 MED ORDER — BRIMONIDINE TARTRATE-TIMOLOL 0.2-0.5 % OP SOLN
OPHTHALMIC | Status: DC | PRN
  Administered 2024-09-29: 1 [drp] via OPHTHALMIC

## 2024-09-29 MED ORDER — SIGHTPATH DOSE#1 NA HYALUR & NA CHOND-NA HYALUR IO KIT
PACK | INTRAOCULAR | Status: DC | PRN
  Administered 2024-09-29: 1 via OPHTHALMIC

## 2024-09-29 MED ORDER — LIDOCAINE HCL (PF) 2 % IJ SOLN
INTRAOCULAR | Status: DC | PRN
  Administered 2024-09-29: 4 mL via INTRAOCULAR

## 2024-09-29 MED ORDER — CYCLOPENTOLATE HCL 2 % OP SOLN
OPHTHALMIC | Status: AC
  Filled 2024-09-29: qty 2

## 2024-09-29 MED ORDER — CYCLOPENTOLATE HCL 2 % OP SOLN
1.0000 [drp] | OPHTHALMIC | Status: AC | PRN
  Administered 2024-09-29 (×3): 1 [drp] via OPHTHALMIC

## 2024-09-29 MED ORDER — MIDAZOLAM HCL (PF) 2 MG/2ML IJ SOLN
INTRAMUSCULAR | Status: DC | PRN
  Administered 2024-09-29: 2 mg via INTRAVENOUS

## 2024-09-29 MED ORDER — PHENYLEPHRINE HCL 10 % OP SOLN
1.0000 [drp] | OPHTHALMIC | Status: AC | PRN
  Administered 2024-09-29 (×3): 1 [drp] via OPHTHALMIC

## 2024-09-29 MED ORDER — SIGHTPATH DOSE#1 BSS IO SOLN
INTRAOCULAR | Status: DC | PRN
  Administered 2024-09-29: 15 mL via INTRAOCULAR

## 2024-09-29 MED ORDER — TETRACAINE HCL 0.5 % OP SOLN
OPHTHALMIC | Status: AC
  Filled 2024-09-29: qty 4

## 2024-09-29 MED ORDER — FENTANYL CITRATE (PF) 100 MCG/2ML IJ SOLN
INTRAMUSCULAR | Status: DC | PRN
  Administered 2024-09-29 (×2): 50 ug via INTRAVENOUS

## 2024-09-29 MED ORDER — FENTANYL CITRATE (PF) 100 MCG/2ML IJ SOLN
INTRAMUSCULAR | Status: AC
  Filled 2024-09-29: qty 2

## 2024-09-29 MED ORDER — PHENYLEPHRINE HCL 10 % OP SOLN
OPHTHALMIC | Status: AC
  Filled 2024-09-29: qty 5

## 2024-09-29 MED ORDER — MOXIFLOXACIN HCL 0.5 % OP SOLN
OPHTHALMIC | Status: DC | PRN
  Administered 2024-09-29: .2 mL via OPHTHALMIC

## 2024-09-29 MED ORDER — SIGHTPATH DOSE#1 BSS IO SOLN
INTRAOCULAR | Status: DC | PRN
  Administered 2024-09-29: 76 mL via OPHTHALMIC

## 2024-09-29 NOTE — Transfer of Care (Signed)
 Immediate Anesthesia Transfer of Care Note  Patient: Steven Saunders.  Procedure(s) Performed: PHACOEMULSIFICATION, CATARACT, WITH IOL INSERTION 10.25 01:07.4 (Right: Eye)  Patient Location: PACU  Anesthesia Type: MAC  Level of Consciousness: awake, alert  and patient cooperative  Airway and Oxygen Therapy: Patient Spontanous Breathing and Patient connected to supplemental oxygen  Post-op Assessment: Post-op Vital signs reviewed, Patient's Cardiovascular Status Stable, Respiratory Function Stable, Patent Airway and No signs of Nausea or vomiting  Post-op Vital Signs: Reviewed and stable  Complications: No notable events documented.

## 2024-09-29 NOTE — Anesthesia Postprocedure Evaluation (Signed)
"   Anesthesia Post Note  Patient: Steven Saunders.  Procedure(s) Performed: PHACOEMULSIFICATION, CATARACT, WITH IOL INSERTION 10.25 01:07.4 (Right: Eye)  Patient location during evaluation: PACU Anesthesia Type: MAC Level of consciousness: awake and alert and oriented Pain management: satisfactory to patient Vital Signs Assessment: post-procedure vital signs reviewed and stable Respiratory status: spontaneous breathing Cardiovascular status: blood pressure returned to baseline Anesthetic complications: no   No notable events documented.   Last Vitals:  Vitals:   09/29/24 0810 09/29/24 0813  BP: 127/73 122/75  Pulse: 62 61  Resp: 13 16  Temp:  36.6 C  SpO2: 97% 96%    Last Pain:  Vitals:   09/29/24 0813  TempSrc:   PainSc: 0-No pain                 [name] GORMAN Balloon      "

## 2024-09-29 NOTE — H&P (Signed)
 [facility]   Primary Care Physician:  Gasper Nancyann BRAVO, MD Ophthalmologist: Dr. Curtistine Fava  Pre-Procedure History & Physical: HPI:  Steven Saunders. is a 74 y.o. male here for cataract surgery.   Past Medical History:  Diagnosis Date   Adenocarcinoma of prostate (HCC)    a.) Bx (+) Gleason 6 (8/12 cores) with PSA 5.3 in 2018; b.) Bx (+) 2019 Gleason 6 (9/12 cores) with PSA 6.3; c.) 09/2022 --> stage IIIb (cT3b N0 M0) Gleason 7 (3+4) with  PSA of 11 --> prostate MRI with signs of extracapsular extension and suspicion for seminal vesicle involvement of the right base  --> PSMA PET 11/03/2022 (+) FDG uptake right lateral gland (SUV max 13.1); no metastatic disease.   Aortic atherosclerosis    Arthritis    CAD (coronary artery disease) 09/19/2005   a.) LHC 09/19/2005: 30 mLAD, 30% dLAD - med mgmt.   ED (erectile dysfunction)    a.) on PDE5i (sildenafil )   HLD (hyperlipidemia)    Hypertension    Macular degeneration    left   Permanent atrial fibrillation (HCC)    a.) CHA2DS2VASc = 3 (age, HTN, vascular disease history);  b.) rate/rhythm maintained on oral digoxin  + carvedilol ; refuses chronically anticoagulation   RBBB     Past Surgical History:  Procedure Laterality Date   APPENDECTOMY     CATARACT EXTRACTION W/PHACO Left 09/15/2024   Procedure: PHACOEMULSIFICATION, CATARACT, WITH IOL INSERTION 16.19, 02:04.3;  Surgeon: Fava Curtistine PARAS, MD;  Location: [facility];  Service: Ophthalmology;  Laterality: Left;   COLONOSCOPY WITH PROPOFOL  N/A 05/26/2017   Procedure: COLONOSCOPY WITH PROPOFOL ;  Surgeon: Jinny Carmine, MD;  Location: [facility];  Service: Endoscopy;  Laterality: N/A;   COLONOSCOPY WITH PROPOFOL  N/A 06/17/2022   Procedure: COLONOSCOPY WITH PROPOFOL ;  Surgeon: Jinny Carmine, MD;  Location: [facility];  Service: Endoscopy;  Laterality: N/A;   HERNIA REPAIR     1950's x 2   JOINT REPLACEMENT Left 2014   LEFT HEART CATH AND CORONARY ANGIOGRAPHY  Left 09/19/2005   Procedure: LEFT HEART CATH AND CORONARY ANGIOGRAPHY; Location: [facility]; Surgeon: Maude Emmer, MD   PELVIC LYMPH NODE DISSECTION Bilateral 12/22/2022   Procedure: PELVIC LYMPH NODE DISSECTION;  Surgeon: Francisca Redell BROCKS, MD;  Location: [facility];  Service: Urology;  Laterality: Bilateral;   PROSTATE BIOPSY     ROBOT ASSISTED LAPAROSCOPIC RADICAL PROSTATECTOMY N/A 12/22/2022   Procedure: XI ROBOTIC ASSISTED LAPAROSCOPIC RADICAL PROSTATECTOMY;  Surgeon: Francisca Redell BROCKS, MD;  Location: [facility];  Service: Urology;  Laterality: N/A;   SPINE SURGERY  08/08/2019   thumb surgery  1980   had staph infection and had to cut out the area   TOTAL HIP ARTHROPLASTY Left 2014   Dr. Cleotilde   TOTAL HIP ARTHROPLASTY Right 05/04/2019   Procedure: TOTAL HIP ARTHROPLASTY ANTERIOR APPROACH;  Surgeon: Leora Lynwood SAUNDERS, MD;  Location: [facility];  Service: Orthopedics;  Laterality: Right;    Prior to Admission medications  Medication Sig Start Date End Date Taking? Authorizing Provider  acetaminophen  (TYLENOL ) 500 MG tablet Take 500 mg by mouth daily as needed.    Yes [provider]  apixaban  (ELIQUIS ) 5 MG TABS tablet TAKE 1 TABLET BY MOUTH TWICE DAILY 07/07/24  Yes Riddle, Suzann, NP  Ascorbic Acid (VITAMIN C) 500 MG CAPS Take 1 capsule by mouth daily.   Yes [provider]  carvedilol  (COREG ) 25 MG tablet Take 1 tablet (25 mg total) by mouth 2 (two) times  daily. 11/26/23  Yes Gasper Nancyann BRAVO, MD  Cholecalciferol (VITAMIN D -3) 25 MCG (1000 UT) CAPS Take 1 capsule by mouth daily.   Yes [provider]  docusate sodium  (COLACE) 100 MG capsule Take 100 mg by mouth daily.   Yes [provider]  folic acid (FOLVITE) 800 MCG tablet Take 1,000 mcg by mouth daily.   Yes [provider]  glucosamine-chondroitin 500-400 MG tablet Take 1 tablet by mouth daily.   Yes [provider]  irbesartan  (AVAPRO ) 300 MG tablet Take 1 tablet (300 mg total) by mouth  daily. 10/29/23  Yes Riddle, Suzann, NP  loratadine  (CLARITIN ) 10 MG tablet Take 10 mg by mouth daily.   Yes [provider]  lovastatin (MEVACOR) 20 MG tablet TAKE 1 TABLET BY MOUTH ONCE EVERY EVENING 06/30/24  Yes Gasper Nancyann BRAVO, MD  meloxicam (MOBIC) 15 MG tablet Take 15 mg by mouth daily. 04/11/24  Yes [provider]  Misc Natural Products (LUTEIN 20 PO) Take by mouth.   Yes [provider]  Multiple Vitamin (MULTIVITAMIN) tablet Take 1 tablet by mouth daily.   Yes [provider]  sildenafil  (VIAGRA ) 100 MG tablet Take 1 tablet (100 mg total) by mouth daily as needed for erectile dysfunction. 10/22/23  Yes Francisca Redell BROCKS, MD  spironolactone  (ALDACTONE ) 25 MG tablet Take 1 tablet (25 mg total) by mouth daily. 08/01/24  Yes Gasper Nancyann BRAVO, MD  valACYclovir (VALTREX) 1000 MG tablet TAKE 2 TABLETS BY MOUTH EVERY 12 HOURS AS NEEDED AS DIRECTED 08/28/22  Yes Gasper Nancyann BRAVO, MD    Allergies as of 06/29/2024   (No Known Allergies)    Family History  Problem Relation Age of Onset   Hypertension Mother    Heart attack Father    Melanoma Father    Prostate cancer Neg Hx    Bladder Cancer Neg Hx    Kidney cancer Neg Hx     Social History   Socioeconomic History   Marital status: Married    Spouse name: Rollene   Number of children: 2   Years of education: Not on file   Highest education level: Some college, no degree  Occupational History   Occupation: Retired    Comment: Still drives school bus  Tobacco Use   Smoking status: Former    Current packs/day: 0.00    Average packs/day: 2.0 packs/day for 4.0 years (8.0 ttl pk-yrs)    Types: Cigarettes    Start date: 11/13/1994    Quit date: 11/13/1998    Years since quitting: 25.8   Smokeless tobacco: Never   Tobacco comments:    Pt states he quit around 2000?  Vaping Use   Vaping status: Never Used  Substance and Sexual Activity   Alcohol use: Yes    Alcohol/week: 4.0 - 8.0 standard  drinks of alcohol    Types: 4 - 8 Cans of beer per week    Comment: 1-2 beers 4xs a week   Drug use: Yes    Types: Marijuana    Comment: as teenager   Sexual activity: Not Currently    Birth control/protection: None  Other Topics Concern   Not on file  Social History Narrative   2 biological children and 2 step-children   Social Drivers of Health   Tobacco Use: Medium Risk (09/15/2024)   Patient History    Smoking Tobacco Use: Former    Smokeless Tobacco Use: Never    Passive Exposure: Not on Actuary  Strain: Low Risk (07/08/2024)   Overall Financial Resource Strain (CARDIA)    Difficulty of Paying Living Expenses: Not very hard  Food Insecurity: No Food Insecurity (07/08/2024)   Epic    Worried About Programme Researcher, Broadcasting/film/video in the Last Year: Never true    Ran Out of Food in the Last Year: Never true  Transportation Needs: No Transportation Needs (07/08/2024)   Epic    Lack of Transportation (Medical): No    Lack of Transportation (Non-Medical): No  Physical Activity: Insufficiently Active (07/08/2024)   Exercise Vital Sign    Days of Exercise per Week: 1 day    Minutes of Exercise per Session: 10 min  Stress: No Stress Concern Present (07/08/2024)   [employer] of Occupational Health - Occupational Stress Questionnaire    Feeling of Stress: Not at all  Social Connections: Moderately Integrated (07/08/2024)   Social Connection and Isolation Panel    Frequency of Communication with Friends and Family: More than three times a week    Frequency of Social Gatherings with Friends and Family: Twice a week    Attends Religious Services: 1 to 4 times per year    Active Member of [employer] or Organizations: No    Attends Banker Meetings: Not on file    Marital Status: Married  Catering Manager Violence: Not At Risk (07/08/2023)   Humiliation, Afraid, Rape, and Kick questionnaire    Fear of Current or Ex-Partner: No    Emotionally Abused: No    Physically  Abused: No    Sexually Abused: No  Depression (PHQ2-9): Low Risk (07/15/2024)   Depression (PHQ2-9)    PHQ-2 Score: 0  Alcohol Screen: Low Risk (07/08/2024)   Alcohol Screen    Last Alcohol Screening Score (AUDIT): 3  Housing: Low Risk (07/08/2024)   Epic    Unable to Pay for Housing in the Last Year: No    Number of Times Moved in the Last Year: 0    Homeless in the Last Year: No  Utilities: Not At Risk (07/04/2023)   AHC Utilities    Threatened with loss of utilities: No  Health Literacy: Adequate Health Literacy (07/08/2023)   B1300 Health Literacy    Frequency of need for help with medical instructions: Never    Review of Systems: See HPI, otherwise negative ROS  Physical Exam: BP 138/86   Pulse 66   Temp (!) 97.2 F (36.2 C) (Temporal)   Resp 11   Wt 122.5 kg   SpO2 97%   BMI 36.62 kg/m  General:   Alert, cooperative in NAD Head:  Normocephalic and atraumatic. Respiratory:  Normal work of breathing. Cardiovascular:  RRR  Impression/Plan: Kayla ONEIDA Lurena Mickey. is here for cataract surgery RIGHT EYE.  Risks, benefits, limitations, and alternatives regarding cataract surgery have been reviewed with the patient.  Questions have been answered.  All parties agreeable.   Curtistine JINNY Fava, MD  09/29/2024, 7:23 AM

## 2024-09-29 NOTE — Anesthesia Preprocedure Evaluation (Addendum)
"                                    Anesthesia Evaluation  Patient identified by MRN, date of birth, ID band Patient awake    Reviewed: Allergy & Precautions, NPO status , Patient's Chart, lab work & pertinent test results, reviewed documented beta blocker date and time   Airway Mallampati: III  TM Distance: >3 FB     Dental  (+) Missing   Pulmonary former smoker, PE   Pulmonary exam normal        Cardiovascular Exercise Tolerance: Good hypertension, + CAD  + dysrhythmias Atrial Fibrillation  Rhythm:Irregular Rate:Normal  HLD On Beta Blocker  EKG 01/2024: Atrial fibrillation with slow ventricular response Right bundle branch block When compared with ECG of 04-Dec-2023 10:14, No significant change was found Confirmed by Darron Grass ([id number]) on 02/02/2024 3:25:40   Echo 10/2023: FINDINGS   Left Ventricle: Left ventricular ejection fraction, by estimation, is 45  to 50%. Left ventricular ejection fraction by 3D volume is 45 %. The left  ventricle has mildly decreased function. The left ventricle has no  regional wall motion abnormalities. The   average left ventricular global longitudinal strain is -18.2 %. Strain  was performed and the global longitudinal strain is normal. The left  ventricular internal cavity size was normal in size. There is mild left  ventricular hypertrophy. Left ventricular   diastolic parameters are indeterminate.      Neuro/Psych  Neuromuscular disease    GI/Hepatic   Endo/Other    Renal/GU    Hx of prostate cancer    Musculoskeletal  (+) Arthritis ,    Abdominal Normal abdominal exam  (+)   Peds  Hematology   Anesthesia Other Findings Permanent atrial fibrillation (HCC) Adenocarcinoma of prostate (HCC) Hypertension RBBB HLD (hyperlipidemia)  ED (erectile dysfunction) Aortic atherosclerosis CAD (coronary artery disease) Macular degeneration  Arthritis     LABS: Na 134 K+ 5.5     BUN 14 Creatinine  0.84 Sodium 134 Potassium 5.5 High        Reproductive/Obstetrics                              Anesthesia Physical Anesthesia Plan  ASA: 3  Anesthesia Plan: MAC   Post-op Pain Management:    Induction:   PONV Risk Score and Plan:   Airway Management Planned: Nasal Cannula  Additional Equipment:   Intra-op Plan:   Post-operative Plan:   Informed Consent: I have reviewed the patients History and Physical, chart, labs and discussed the procedure including the risks, benefits and alternatives for the proposed anesthesia with the patient or authorized representative who has indicated his/her understanding and acceptance.       Plan Discussed with:   Anesthesia Plan Comments:          Anesthesia Quick Evaluation  "

## 2024-09-29 NOTE — Op Note (Signed)
 PREOPERATIVE DIAGNOSIS:  Nuclear sclerotic cataract of the right eye.   POSTOPERATIVE DIAGNOSIS:  Right Eye Cataract   OPERATIVE PROCEDURE: Phacoemulsification with IOL Implant Right Eye   SURGEON:  Curtistine Fava, MD   ANESTHESIA:  Anesthesiologist: Quin Annabella RAMAN, MD CRNA: Jahoo, Sonia, CRNA  Monitored anesthesia care. Topical tetracaine  drops followed by 2% Xylocaine  jelly applied in the preoperative holding area 0.90ml of epi-Shugarcaine was instilled in the eye following the paracentesis.   COMPLICATIONS:  None.   TECHNIQUE:   Phacoemulsification divide and conquer   DESCRIPTION OF PROCEDURE:  The patient was examined and consented in the preoperative holding area where the aforementioned topical anesthesia was applied to the right eye and then brought back to the Operating Room where the right eye was prepped and draped in the usual sterile ophthalmic fashion and a lid speculum was placed. A paracentesis was created with the side port blade and the anterior chamber was filled with epi-Shugarcaine follower by viscoelastic. A clear corneal incision was performed with the steel keratome. A continuous curvilinear capsulorrhexis was performed with a cystotome followed by the capsulorrhexis forceps. Hydrodissection and hydrodelineation were carried out with BSS on a blunt cannula. The lens was removed in a divide and conquer technique and the remaining cortical material was removed with the irrigation-aspiration handpiece. The capsular bag was inflated with viscoelastic and the CC60WF +15.5D lens was placed in the capsular bag without complication. The remaining viscoelastic was removed from the eye with the irrigation-aspiration handpiece. The wounds were hydrated. The anterior chamber was flushed with BSS and the eye was inflated to physiologic pressure. 0.21ml of Vigamox  was placed in the anterior chamber. The wounds were found to be water  tight. The eye was dressed with Combigan  and covered  with a clear shield to be worn until the first postoperative day appointment. The patient was given protective glasses to wear throughout the day. The patient was also given drops with which to begin a drop regimen today and will follow-up with me in one day.  Procedures: PHACOEMULSIFICATION, CATARACT, WITH IOL INSERTION 10.25 01:07.4 (Right)  Electronically signed: Curtistine PARAS [facility] 09/29/2024 8:07 AM

## 2024-11-02 ENCOUNTER — Ambulatory Visit

## 2025-02-13 ENCOUNTER — Ambulatory Visit: Admitting: Family Medicine

## 2025-03-07 ENCOUNTER — Other Ambulatory Visit

## 2025-08-31 ENCOUNTER — Other Ambulatory Visit

## 2025-09-07 ENCOUNTER — Ambulatory Visit: Admitting: Urology
